# Patient Record
Sex: Male | Born: 1955
Health system: Southern US, Community
[De-identification: ages and names within clinical notes are randomized; demographics above are authoritative.]

## PROBLEM LIST (undated history)

## (undated) DIAGNOSIS — Z9989 Dependence on other enabling machines and devices: Secondary | ICD-10-CM

## (undated) DIAGNOSIS — G4733 Obstructive sleep apnea (adult) (pediatric): Secondary | ICD-10-CM

## (undated) DIAGNOSIS — IMO0002 Reserved for concepts with insufficient information to code with codable children: Secondary | ICD-10-CM

## (undated) DIAGNOSIS — E785 Hyperlipidemia, unspecified: Secondary | ICD-10-CM

## (undated) DIAGNOSIS — I10 Essential (primary) hypertension: Secondary | ICD-10-CM

## (undated) HISTORY — DX: Dependence on other enabling machines and devices: Z99.89

## (undated) HISTORY — DX: Hyperlipidemia, unspecified: E78.5

## (undated) HISTORY — DX: Reserved for concepts with insufficient information to code with codable children: IMO0002

## (undated) HISTORY — DX: Obstructive sleep apnea (adult) (pediatric): G47.33

## (undated) HISTORY — DX: Essential (primary) hypertension: I10

## (undated) HISTORY — PX: TONSILLECTOMY: SUR1361

---

## 1997-08-07 DIAGNOSIS — Z9989 Dependence on other enabling machines and devices: Secondary | ICD-10-CM

## 1997-08-07 DIAGNOSIS — G4733 Obstructive sleep apnea (adult) (pediatric): Secondary | ICD-10-CM

## 1997-08-07 HISTORY — PX: OTHER SURGICAL HISTORY: SHX169

## 1997-08-07 HISTORY — DX: Obstructive sleep apnea (adult) (pediatric): G47.33

## 1997-08-07 HISTORY — DX: Obstructive sleep apnea (adult) (pediatric): Z99.89

## 2002-09-24 ENCOUNTER — Encounter: Payer: Self-pay | Admitting: Family Medicine

## 2005-08-31 ENCOUNTER — Encounter: Payer: Self-pay | Admitting: Family Medicine

## 2006-02-01 ENCOUNTER — Encounter: Payer: Self-pay | Admitting: Family Medicine

## 2007-02-12 ENCOUNTER — Encounter: Payer: Self-pay | Admitting: Family Medicine

## 2008-10-30 ENCOUNTER — Ambulatory Visit: Payer: Self-pay | Admitting: Family Medicine

## 2008-10-30 DIAGNOSIS — G4730 Sleep apnea, unspecified: Secondary | ICD-10-CM

## 2008-10-30 DIAGNOSIS — G4733 Obstructive sleep apnea (adult) (pediatric): Secondary | ICD-10-CM | POA: Insufficient documentation

## 2008-11-04 ENCOUNTER — Encounter: Payer: Self-pay | Admitting: Family Medicine

## 2008-11-09 ENCOUNTER — Encounter: Payer: Self-pay | Admitting: Family Medicine

## 2008-11-09 DIAGNOSIS — E669 Obesity, unspecified: Secondary | ICD-10-CM | POA: Insufficient documentation

## 2008-11-09 DIAGNOSIS — J45909 Unspecified asthma, uncomplicated: Secondary | ICD-10-CM | POA: Insufficient documentation

## 2008-11-10 LAB — CONVERTED CEMR LAB
AST: 24 units/L (ref 0–37)
BUN: 16 mg/dL (ref 6–23)
Chloride: 104 meq/L (ref 96–112)
Glucose, Bld: 133 mg/dL — ABNORMAL HIGH (ref 70–99)
HDL: 49 mg/dL (ref >39)
LDL Cholesterol: 95 mg/dL (ref 0–99)
PSA, Free: 0.1 ng/mL
PSA: 0.49 ng/mL (ref 0.10–4.00)
Potassium: 4.8 meq/L (ref 3.5–5.3)
Triglycerides: 179 mg/dL — ABNORMAL HIGH (ref <150)

## 2008-11-13 ENCOUNTER — Ambulatory Visit: Payer: Self-pay | Admitting: Family Medicine

## 2008-11-13 LAB — CONVERTED CEMR LAB: Blood Glucose, Fasting: 117 mg/dL

## 2009-05-24 ENCOUNTER — Ambulatory Visit: Payer: Self-pay | Admitting: Family Medicine

## 2009-05-24 LAB — CONVERTED CEMR LAB: Hgb A1c MFr Bld: 6.3 %

## 2009-06-21 ENCOUNTER — Encounter: Payer: Self-pay | Admitting: Family Medicine

## 2009-07-23 ENCOUNTER — Ambulatory Visit: Payer: Self-pay | Admitting: Family Medicine

## 2009-08-04 ENCOUNTER — Telehealth (INDEPENDENT_AMBULATORY_CARE_PROVIDER_SITE_OTHER): Payer: Self-pay | Admitting: *Deleted

## 2009-08-16 ENCOUNTER — Ambulatory Visit: Payer: Self-pay | Admitting: Family Medicine

## 2009-08-16 DIAGNOSIS — I10 Essential (primary) hypertension: Secondary | ICD-10-CM | POA: Insufficient documentation

## 2009-09-27 ENCOUNTER — Ambulatory Visit: Payer: Self-pay | Admitting: Family Medicine

## 2009-09-27 LAB — CONVERTED CEMR LAB
Albumin/Creatinine Ratio, Urine, POC: <30
Creatinine,U: 300 mg/dL
Hgb A1c MFr Bld: 6.1 %
Microalbumin U total vol: 10 mg/L

## 2009-09-28 LAB — CONVERTED CEMR LAB
BUN: 14 mg/dL (ref 6–23)
Basophils Absolute: 0 10*3/uL (ref 0.0–0.1)
Basophils Relative: 0 % (ref 0–1)
CO2: 24 meq/L (ref 19–32)
Calcium: 9.4 mg/dL (ref 8.4–10.5)
Chloride: 105 meq/L (ref 96–112)
Creatinine, Ser: 0.82 mg/dL (ref 0.40–1.50)
Eosinophils Absolute: 0.2 10*3/uL (ref 0.0–0.7)
Eosinophils Relative: 2 % (ref 0–5)
Glucose, Bld: 132 mg/dL — ABNORMAL HIGH (ref 70–99)
HCT: 47 % (ref 39.0–52.0)
Hemoglobin: 15.9 g/dL (ref 13.0–17.0)
Lymphocytes Relative: 29 % (ref 12–46)
Lymphs Abs: 2 10*3/uL (ref 0.7–4.0)
MCHC: 33.8 g/dL (ref 30.0–36.0)
MCV: 90.4 fL (ref 78.0–100.0)
Monocytes Absolute: 0.5 10*3/uL (ref 0.1–1.0)
Monocytes Relative: 7 % (ref 3–12)
Neutro Abs: 4.3 10*3/uL (ref 1.7–7.7)
Neutrophils Relative %: 62 % (ref 43–77)
Platelets: 235 10*3/uL (ref 150–400)
Potassium: 4.4 meq/L (ref 3.5–5.3)
RBC: 5.2 M/uL (ref 4.22–5.81)
RDW: 13.7 % (ref 11.5–15.5)
Sodium: 138 meq/L (ref 135–145)
WBC: 7 10*3/uL (ref 4.0–10.5)

## 2010-01-03 ENCOUNTER — Ambulatory Visit: Payer: Self-pay | Admitting: Family Medicine

## 2010-09-06 NOTE — Assessment & Plan Note (Signed)
Summary: LFT ANKLE INJURY/TM   Vital Signs:  Patient Profile:   55 Years Old Male CC:      left ankle injury, "moving washing machine yesterday" "stepped wrong" Height:     71 inches Weight:      277 pounds O2 Sat:      98 % O2 treatment:    Room Air Temp:     98.2 degrees F oral Pulse rate:   73 / minute Resp:     18 per minute BP supine:   134 / 87  (right arm)  Pt. in pain?   yes    Location:   ankle    Intensity:   5    Type:       aching  Vitals Entered By: Joanne Chars, CMA                   Prior Medication List:  ACCU-CHEK AVIVA  STRP (GLUCOSE BLOOD) Use as directed to sugar two times a day METFORMIN HCL 850 MG TABS (METFORMIN HCL) 1 tab by mouth once daily with dinner LISINOPRIL 10 MG TABS (LISINOPRIL) 1 tab by mouth once daily FLUOXETINE HCL 20 MG CAPS (FLUOXETINE HCL) 1 capsule by mouth once daily   Current Allergies: ! * SHINGLES MEDICINE   History of Present Illness Chief Complaint: left ankle injury, "moving washing machine yesterday" "stepped wrong" History of Present Illness: Subjective:  Patient complains of twisting his left ankle yesterday while lifting an appliance.  He has pain on lateral aspect of ankle and pain walking  REVIEW OF SYSTEMS Constitutional Symptoms      Denies fever, chills, night sweats, weight loss, weight gain, and fatigue.  Eyes       Denies change in vision, eye pain, eye discharge, glasses, contact lenses, and eye surgery. Ear/Nose/Throat/Mouth       Denies hearing loss/aids, change in hearing, ear pain, ear discharge, dizziness, frequent runny nose, frequent nose bleeds, sinus problems, sore throat, hoarseness, and tooth pain or bleeding.  Respiratory       Denies dry cough, productive cough, wheezing, shortness of breath, asthma, bronchitis, and emphysema/COPD.  Cardiovascular       Denies murmurs, chest pain, and tires easily with exhertion.    Gastrointestinal       Denies stomach pain, nausea/vomiting,  diarrhea, constipation, blood in bowel movements, and indigestion. Genitourniary       Denies painful urination, kidney stones, and loss of urinary control. Neurological       Denies paralysis, seizures, and fainting/blackouts. Musculoskeletal       Complains of redness and swelling.      Denies muscle pain, joint pain, joint stiffness, decreased range of motion, muscle weakness, and gout.      Comments: pain mainly when walking Skin       Denies bruising, unusual mles/lumps or sores, and hair/skin or nail changes.  Psych       Denies mood changes, temper/anger issues, anxiety/stress, speech problems, depression, and sleep problems. Other Comments: left ankle injury yesterday, moving washer, "stepped wrong"    Objective:  No acute distress  Left ankle:  Decreased range of motion.  Tenderness and swelling over the lateral malleolus.  Joint stable.  No tenderness over the base of the fifth  metatarsal.  Distal neurovascular intact.  Left ankle X-ray:  negative Assessment New Problems: ANKLE SPRAIN, LEFT (ICD-845.00)   Plan New Medications/Changes: LORTAB 5 5-500 MG TABS (HYDROCODONE-ACETAMINOPHEN) One or two tabs by mouth hs as needed  pain  #10 (ten) x 0, 01/03/2010, Donna Christen MD NAPROXEN 500 MG TABS (NAPROXEN) One by mouth two times a day pc  #20 x 0, 01/03/2010, Donna Christen MD  New Orders: T-DG Ankle Complete*L* [10272] New Patient Level III [99203] Aircast Ankle Brace [L4350] Planning Comments:   Apply ice pack for 30 to 45 minutes every 1 to 4 hours.  Continue until swelling decreases.   Naprosyn two times a day.  Analgesic for night time as needed. Wear ace wrap until swelling resolves.  Use crutches for about 4 to 5 days (patient already has pair).  Dispensed AirCast:  wear for 3 to 4 weeks.  Begin range of motion exercises in about 5 to 7 days ([employer] information and instruction patient handout given)  Follow-up with orthopedist if not improving 2 weeks.   The  patient and/or caregiver has been counseled thoroughly with regard to medications prescribed including dosage, schedule, interactions, rationale for use, and possible side effects and they verbalize understanding.  Diagnoses and expected course of recovery discussed and will return if not improved as expected or if the condition worsens. Patient and/or caregiver verbalized understanding.  Prescriptions: LORTAB 5 5-500 MG TABS (HYDROCODONE-ACETAMINOPHEN) One or two tabs by mouth hs as needed pain  #10 (ten) x 0   Entered and Authorized by:   Donna Christen MD   Signed by:   Donna Christen MD on 01/03/2010   Method used:   Print then Give to Patient   RxID:   [id number] NAPROXEN 500 MG TABS (NAPROXEN) One by mouth two times a day pc  #20 x 0   Entered and Authorized by:   Donna Christen MD   Signed by:   Donna Christen MD on 01/03/2010   Method used:   Print then Give to Patient   RxID:   [id number]

## 2010-09-06 NOTE — Assessment & Plan Note (Signed)
Summary: f/u DM/ HTN/ mood   Vital Signs:  Patient profile:   55 year old male Height:      71 inches Weight:      268 pounds BMI:     37.51 O2 Sat:      97 % on Room air Temp:     98.4 degrees F oral Pulse rate:   59 / minute BP sitting:   112 / 70  (left arm) Cuff size:   large  Vitals Entered By: Payton Spark CMA (September 27, 2009 9:29 AM)  O2 Flow:  Room air CC: F/U mood and A1C   Primary Care Provider:  Seymour Bars DO  CC:  F/U mood and A1C.  History of Present Illness: 55 yo WM presents for f/u T2DM, HTN and new dx of depression.  6 wks ago, we started him on Fluoxetine 10 mg/ day and Lisinopril 10 mg/ day.  He is on metformin two times a day for T2DM.  His AM fasting are running 110s to 130s.  He has a red spot that is tender over the R upper arm for the past wk and it seems to be making his blood sugars go up.    It is not draining.  He has not had a fever.    His mood is improving some but he still has room for imporovement.  He is still not exercising.      Current Medications (verified): 1)  Accucheck Aviva .... Use Daily As Directed 2)  Metformin Hcl 850 Mg Tabs (Metformin Hcl) .Marland Kitchen.. 1 Tab By Mouth Once Daily With Dinner 3)  Lisinopril 10 Mg Tabs (Lisinopril) .Marland Kitchen.. 1 Tab By Mouth Once Daily 4)  Fluoxetine Hcl 10 Mg Caps (Fluoxetine Hcl) .Marland Kitchen.. 1 Capsule By Mouth Daily X 1 Wk Then 2 Capsules By Mouth Once Daily  Allergies (verified): No Known Drug Allergies  Past History:  Past Medical History: OSA, on CPAP since 1999 HNP C6-7 dyslipidemia T2DM 05-2009 HTN  last Tetanus 2008 colonoscopy 2008 Dr Danielle Dess  Past Surgical History: Reviewed history from 10/30/2008 and no changes required. C6-7 ruptured disc surgery 1999 tonsillectomy age 21  Social History: Reviewed history from 10/30/2008 and no changes required. [employer]/ [employer], self - employed. Quit smoking a pipe. < 1 ETOH/ wk. Lives with GF [employer] [employer].  Has 3 kids (grown), Damaris Hippo,  [place]. Walks/ dances. Fair diet.  Review of Systems      See HPI  Physical Exam  General:  alert, well-developed, well-nourished, and well-hydrated.  obese Head:  normocephalic and atraumatic.   Nose:  no nasal discharge.   Mouth:  pharynx pink and moist and fair dentition.   Neck:  no masses.   Lungs:  Normal respiratory effort, chest expands symmetrically. Lungs are clear to auscultation, no crackles or wheezes. Heart:  Normal rate and regular rhythm. S1 and S2 normal without gallop, murmur, click, rub or other extra sounds. Msk:  no joint swelling, no joint warmth, and no redness over joints.   Pulses:  2+ radial pulses Extremities:  no LE or UE edema RUE with tenderness and injection with palpable cord overlying superficial veins just superior to the elbow. Skin:  color normal.   Cervical Nodes:  No lymphadenopathy noted Psych:  good eye contact, not anxious appearing, and not depressed appearing.     Impression & Recommendations:  Problem # 1:  SUPERFICIAL PHLEBITIS (ICD-451.0) Assessment New RUE cephalic vein thrombosis per the radiologist today. Since there is no chance  for this to cause harm, treat supportively with warm compresses and pain meds.  Declined RX pain meds.  Can use Tyelnol as needed.  Call if any changes.  Problem # 2:  DEPRESSION (ICD-311) Assessment: Improved Continue Fluoxetine but at 20 mg/ day.  Add regular exercise.  May need increase to 40 when he comes back for mole removal. His updated medication list for this problem includes:    Fluoxetine Hcl 20 Mg Caps (Fluoxetine hcl) .Marland Kitchen... 1 capsule by mouth once daily  Problem # 3:  DM (ICD-250.00) A1C at goal.  Home sugars slightly high but he has yet to really work on diet or exercise.  Cotninue metformin.  Urine micro neg today.   His updated medication list for this problem includes:    Metformin Hcl 850 Mg Tabs (Metformin hcl) .Marland Kitchen... 1 tab by mouth once daily with dinner    Lisinopril 10 Mg Tabs  (Lisinopril) .Marland Kitchen... 1 tab by mouth once daily  Orders: Fingerstick (16109) Hemoglobin A1C (60454) Urine Microalbumin (09811)  Labs Reviewed: Creat: 0.87 (11/04/2008)   Microalbumin: 10 (09/27/2009) Reviewed HgBA1c results: 6.1 (09/27/2009)  6.3 (05/24/2009)  Problem # 4:  ESSENTIAL HYPERTENSION, BENIGN (ICD-401.1) Assessment: New At goal on Lisinopril.  Continue and check BMP today for new start ACEi. His updated medication list for this problem includes:    Lisinopril 10 Mg Tabs (Lisinopril) .Marland Kitchen... 1 tab by mouth once daily  Orders: T-Basic Metabolic Panel [id number])  BP today: 112/70 Prior BP: 136/87 (08/16/2009)  Labs Reviewed: K+: 4.8 (11/04/2008) Creat: : 0.87 (11/04/2008)   Chol: 180 (11/04/2008)   HDL: 49 (11/04/2008)   LDL: 95 (11/04/2008)   TG: 179 (11/04/2008)  Complete Medication List: 1)  Accucheck Aviva  .... Use daily as directed 2)  Metformin Hcl 850 Mg Tabs (Metformin hcl) .Marland Kitchen.. 1 tab by mouth once daily with dinner 3)  Lisinopril 10 Mg Tabs (Lisinopril) .Marland Kitchen.. 1 tab by mouth once daily 4)  Fluoxetine Hcl 20 Mg Caps (Fluoxetine hcl) .Marland Kitchen.. 1 capsule by mouth once daily  Other Orders: T-*Unlisted Diagnostic X-ray test/procedure (13086) T-D-Dimer Fibrin Derivatives Quantitive [phone]) T-CBC w/Diff ([id number])  Patient Instructions: 1)  A1C looks great at 6.1. 2)  BP looks good. 3)  Congrats on wt loss! 4)  AM fasting goal is 80-110. 5)  RFd Fluoxetine at 20 mg/ day. 6)  U/S of Right arm today. 7)  Will call you w/ results. 8)  Keep working on diabetic diet, exercise and wt loss. 9)  Return for shave excision of R foot lesion in 3 wks. Prescriptions: FLUOXETINE HCL 20 MG CAPS (FLUOXETINE HCL) 1 capsule by mouth once daily  #30 x 2   Entered and Authorized by:   Seymour Bars DO   Signed by:   Seymour Bars DO on 09/27/2009   Method used:   [facility]ron[facility] to        [facility] [phone]* (retail)       [address] [place], [place]   [address]       Ph: [phone] or [phone]       Fax: [phone]   RxID:   [id number]   Laboratory Results   Urine Tests    Microalbumin (urine): 10 mg/L Creatinine: 300mg /dL  A:C Ratio <22  Blood Tests     HGBA1C: 6.1%   (Normal Range: Non-Diabetic - 3-6%   Control Diabetic - 6-8%)

## 2010-09-06 NOTE — Assessment & Plan Note (Signed)
Summary: f/u T2DM/ depression   Vital Signs:  Patient profile:   55 year old male Height:      71 inches Weight:      274 pounds BMI:     38.35 O2 Sat:      97 % on Room air Pulse rate:   66 / minute BP sitting:   136 / 87  (left arm) Cuff size:   large  Vitals Entered By: Payton Spark CMA (August 16, 2009 9:48 AM)  O2 Flow:  Room air   CC: F/U DM.    Primary Care Provider:  Seymour Bars DO  CC:  F/U DM. Marland Kitchen  History of Present Illness: 55 yo WM presents for f/u new onset T2DM.  He was started on Metformin 850 mg at night 3 mos ago.  His AM fastings are running 90s -110s.  He did have some AM fastings of 140 while on vacation.  Denies any diarrhea from it.  He saw the nutritionist.  He had a dilated eye exam with Dr Earlene Plater in June 2010.  He is due for PNX and urine microalbumin.  He lost 7 lbs.  He cut back on his carbs.  He really has not had much time to exercise. At the end of our visit, he c/o feeling overwhelmed and depressed.  He is in a longterm relationship w/ his GF who is very negative.        Current Medications (verified): 1)  Accucheck Aviva .... Use Daily As Directed 2)  Metformin Hcl 850 Mg Tabs (Metformin Hcl) .Marland Kitchen.. 1 Tab By Mouth Once Daily With Dinner 3)  Cialis 10 Mg Tabs (Tadalafil) .... Take 1 Tablet By Mouth Once A Day As Needed  Allergies (verified): No Known Drug Allergies  Past History:  Past Medical History: OSA, on CPAP since 1999 HNP C6-7 dyslipidemia T2DM 05-2009  last Tetanus 2008 colonoscopy 2008 Dr Danielle Dess  Past Surgical History: Reviewed history from 10/30/2008 and no changes required. C6-7 ruptured disc surgery 1999 tonsillectomy age 45  Family History: Reviewed history from 10/30/2008 and no changes required. father died, prostate cancer diagnosed at 78, died at 74. mother alive, skin cancer 2 brothers, 1 with skin cancer 1 sister ovarian cancer  Social History: Reviewed history from 10/30/2008 and no changes  required. [employer]/ [employer], self - employed. Quit smoking a pipe. < 1 ETOH/ wk. Lives with GF [employer].  Has 3 kids (grown), Damaris Hippo, [place]. Walks/ dances. Fair diet.  Review of Systems      See HPI  Physical Exam  General:  alert, well-developed, well-nourished, and well-hydrated.   Head:  normocephalic and atraumatic.   Eyes:  PERRLA; wears glasses Nose:  no nasal discharge.   Mouth:  pharynx pink and moist and fair dentition.   Neck:  no masses.   Lungs:  Normal respiratory effort, chest expands symmetrically. Lungs are clear to auscultation, no crackles or wheezes. Heart:  Normal rate and regular rhythm. S1 and S2 normal without gallop, murmur, click, rub or other extra sounds. Extremities:  no LE edema Skin:  color normal.   Cervical Nodes:  No lymphadenopathy noted Psych:  good eye contact, not anxious appearing, and not depressed appearing.    Diabetes Management Exam:    Foot Exam (with socks and/or shoes not present):       Sensory-Pinprick/Light touch:          Left medial foot (L-4): normal          Left  dorsal foot (L-5): normal          Left lateral foot (S-1): normal          Right medial foot (L-4): normal          Right dorsal foot (L-5): normal          Right lateral foot (S-1): normal       Sensory-Monofilament:          Left foot: normal          Right foot: normal       Inspection:          Left foot: normal          Right foot: normal       Nails:          Left foot: normal          Right foot: normal   Impression & Recommendations:  Problem # 1:  DM (ICD-250.00) Improvement in weight and home sugar readings with nutrition counseling, home monitoring and addition of Metformin at night. Keep up the good work.  Umicroalbumin next visit.  Declined PNX today.  Monofilament normal.  Adding ACEi for BPs > 130/80.  A1C in 6 wks.   His updated medication list for this problem includes:    Metformin Hcl 850 Mg Tabs (Metformin hcl) .Marland Kitchen... 1 tab  by mouth once daily with dinner    Lisinopril 10 Mg Tabs (Lisinopril) .Marland Kitchen... 1 tab by mouth once daily  Problem # 2:  DEPRESSION (ICD-311) Assessment: New PHQ-9 score of 14 c/w moderate depression.  Agrees to start on Fluoxetine daily and will consider counseling referral.  We discussed the need to get out of his current relationship.   His updated medication list for this problem includes:    Fluoxetine Hcl 10 Mg Caps (Fluoxetine hcl) .Marland Kitchen... 1 capsule by mouth daily x 1 wk then 2 capsules by mouth once daily  Problem # 3:  ESSENTIAL HYPERTENSION, BENIGN (ICD-401.1) Start low dose ACEi for BPs >130/80 and DM.  Recheck BMP in 6 wks.   His updated medication list for this problem includes:    Lisinopril 10 Mg Tabs (Lisinopril) .Marland Kitchen... 1 tab by mouth once daily  BP today: 136/87 Prior BP: 131/78 (05/24/2009)  Labs Reviewed: K+: 4.8 (11/04/2008) Creat: : 0.87 (11/04/2008)   Chol: 180 (11/04/2008)   HDL: 49 (11/04/2008)   LDL: 95 (11/04/2008)   TG: 179 (11/04/2008)  Complete Medication List: 1)  Accucheck Aviva  .... Use daily as directed 2)  Metformin Hcl 850 Mg Tabs (Metformin hcl) .Marland Kitchen.. 1 tab by mouth once daily with dinner 3)  Lisinopril 10 Mg Tabs (Lisinopril) .Marland Kitchen.. 1 tab by mouth once daily 4)  Fluoxetine Hcl 10 Mg Caps (Fluoxetine hcl) .Marland Kitchen.. 1 capsule by mouth daily x 1 wk then 2 capsules by mouth once daily  Patient Instructions: 1)  Add Lisinopril 10 mg once daily for high BP.  Goal is <130/80. 2)  Add Fluoxetine 10 mg/ day for the first wk then go up to 20 mg once daily for mood. 3)  Call if you want to add counseling or if you have any problems. 4)  Keep working on diabetic diet, exericse, wt  loss. 5)  F/U for mood and A1C in 6 wks. Prescriptions: FLUOXETINE HCL 10 MG CAPS (FLUOXETINE HCL) 1 capsule by mouth daily x 1 wk then 2 capsules by mouth once daily  #60 x 1   Entered and Authorized by:  Seymour Bars DO   Signed by:   Seymour Bars DO on 08/16/2009   Method used:    [facility]        [facility] [phone][facility] (retail)       [address] [place], [place]  [address]       Ph: [phone] or [phone]       Fax: [phone]   RxID:   [id number] LISINOPRIL 10 MG TABS (LISINOPRIL) 1 tab by mouth once daily  #30 x 1   Entered and Authorized by:   Seymour Bars DO   Signed by:   Seymour Bars DO on 08/16/2009   Method used:   [facility] to        [facility] [phone]* (retail)       [address] [place], [place]  [address]       Ph: [phone] or [phone]       Fax: [phone]   RxID:   [id number]

## 2011-01-11 ENCOUNTER — Other Ambulatory Visit: Payer: Self-pay | Admitting: Family Medicine

## 2011-02-19 ENCOUNTER — Encounter: Payer: Self-pay | Admitting: Family Medicine

## 2011-02-24 ENCOUNTER — Ambulatory Visit (INDEPENDENT_AMBULATORY_CARE_PROVIDER_SITE_OTHER): Payer: BC Managed Care – PPO | Admitting: Family Medicine

## 2011-02-24 ENCOUNTER — Encounter: Payer: Self-pay | Admitting: Family Medicine

## 2011-02-24 DIAGNOSIS — I10 Essential (primary) hypertension: Secondary | ICD-10-CM

## 2011-02-24 DIAGNOSIS — Z0000 Encounter for general adult medical examination without abnormal findings: Secondary | ICD-10-CM

## 2011-02-24 DIAGNOSIS — Z125 Encounter for screening for malignant neoplasm of prostate: Secondary | ICD-10-CM

## 2011-02-24 DIAGNOSIS — E119 Type 2 diabetes mellitus without complications: Secondary | ICD-10-CM

## 2011-02-24 LAB — CBC WITH DIFFERENTIAL/PLATELET
Basophils Relative: 0 % (ref 0–1)
Eosinophils Absolute: 0.2 10*3/uL (ref 0.0–0.7)
Eosinophils Relative: 3 % (ref 0–5)
Hemoglobin: 16.1 g/dL (ref 13.0–17.0)
Lymphs Abs: 2.6 10*3/uL (ref 0.7–4.0)
MCH: 31.7 pg (ref 26.0–34.0)
MCHC: 34.3 g/dL (ref 30.0–36.0)
MCV: 92.3 fL (ref 78.0–100.0)
Monocytes Absolute: 0.5 10*3/uL (ref 0.1–1.0)
Monocytes Relative: 6 % (ref 3–12)
Neutrophils Relative %: 56 % (ref 43–77)
RBC: 5.08 MIL/uL (ref 4.22–5.81)

## 2011-02-24 LAB — PSA: PSA: 0.85 ng/mL (ref <=4.00)

## 2011-02-24 MED ORDER — AMBULATORY NON FORMULARY MEDICATION: Status: DC

## 2011-02-24 MED ORDER — METFORMIN HCL 850 MG PO TABS: 850.0000 mg | ORAL_TABLET | Freq: Two times a day (BID) | ORAL | Status: DC

## 2011-02-24 MED ORDER — LISINOPRIL 10 MG PO TABS: 10.0000 mg | ORAL_TABLET | Freq: Every day | ORAL | Status: DC

## 2011-02-24 NOTE — Patient Instructions (Signed)
Update labs today.  Will call you w/ results Monday.  RFd meds.  RX for CPAP supplies given.  Call [facility] to ask about DME supplier locally [phone].  Work on [employer], regular exercise.  Return for f/u diabetes in 4 mos.

## 2011-02-24 NOTE — Progress Notes (Signed)
  Subjective:    Patient ID: Antonio Rivera, male    DOB: [birth year], 55 y.o.   MRN: [id number]  HPI 55 yo WM presents for CPE.  He has not been here since Feb 2011 but says he is still on his DM and BP meds.  He is seeing a chiropractor for his lower back.  He needs a new CPAP machine since his has been over 37 yrs old.  He says that his eye exam was updated in Jan.  Denies chest pain or DOE.  Denies fam hx of premature heart dz.  He is due to repeat his colonoscopy this year.  His DRE/ PSA are due.  Fasting labs are due.  Med RFs are due.    BP 112/73  Pulse 81  Ht 6' (1.829 m)  Wt 275 lb (124.739 kg)  BMI 37.30 kg/m2  SpO2 97%    Review of Systems  Constitutional: Negative for fatigue and unexpected weight change.  HENT: Negative for hearing loss.   Eyes: Negative for visual disturbance.  Respiratory: Negative for shortness of breath.   Cardiovascular: Negative for chest pain, palpitations and leg swelling.  Gastrointestinal: Negative for nausea, abdominal pain, diarrhea and blood in stool.  Genitourinary: Negative for difficulty urinating.  Musculoskeletal: Positive for back pain.  Neurological: Negative for numbness.  Psychiatric/Behavioral: Negative for dysphoric mood.       Objective:   Physical Exam  Constitutional: He appears well-developed and well-nourished. No distress.       obese  HENT:  Right Ear: External ear normal.  Left Ear: External ear normal.  Nose: Nose normal.  Mouth/Throat: Oropharynx is clear and moist.  Eyes: Pupils are equal, round, and reactive to light. No scleral icterus.  Neck: Neck supple. No thyromegaly present.  Cardiovascular: Normal rate, regular rhythm, normal heart sounds and intact distal pulses.   No murmur heard. Pulmonary/Chest: Effort normal and breath sounds normal.  Abdominal: Soft. Bowel sounds are normal. He exhibits no distension. There is no guarding.  Genitourinary: Prostate normal. Guaiac negative stool.  Musculoskeletal:  He exhibits no edema.  Lymphadenopathy:    He has no cervical adenopathy.  Neurological:       Gait normal  Skin: Skin is warm and dry.       Diffuse tiny pustles on abdomen and upper thighs c/w folliculitis  Psychiatric: He has a normal mood and affect.          Assessment & Plan:  Assesment:  1. CPE- Keeping healthy checklist for men reviewed today.  BP at goal.  BMI 37  in the class II obesity range.     Labs ordered Colonoscopy due this year.  He's been contacted by GI to schedule this. DRE done today, PSA added to labs. Tdap UTD, PNX UTD. Encouraged healthy diet, regular exercise, MVI daily. Return for next physical in 1 yr.   Recommend use of an acne body wash for chronic folliculitis. Needs to f/u DM here in 4 mos.

## 2011-02-25 ENCOUNTER — Telehealth: Payer: Self-pay | Admitting: Family Medicine

## 2011-02-25 LAB — MICROALBUMIN / CREATININE URINE RATIO: Microalb, Ur: 0.51 mg/dL (ref 0.00–1.89)

## 2011-02-25 LAB — COMPLETE METABOLIC PANEL WITH GFR
ALT: 37 U/L (ref 0–53)
AST: 27 U/L (ref 0–37)
CO2: 24 mEq/L (ref 19–32)
Chloride: 104 mEq/L (ref 96–112)
GFR, Est African American: >60 mL/min (ref >60)
Sodium: 138 mEq/L (ref 135–145)
Total Bilirubin: 0.3 mg/dL (ref 0.3–1.2)
Total Protein: 7.4 g/dL (ref 6.0–8.3)

## 2011-02-25 LAB — HEMOGLOBIN A1C
Hgb A1c MFr Bld: 7.2 % — ABNORMAL HIGH (ref <5.7)
Mean Plasma Glucose: 160 mg/dL — ABNORMAL HIGH (ref <117)

## 2011-02-25 NOTE — Telephone Encounter (Signed)
Pls let pt know that his cholesterol is just a little high. Be sure to get back on cholesterol medicine and take every night.  A1C is 7.2 indicating fairly good control of diabetes, but will need to stay on metformin 2 x a day, everyday.  Kidney function, prostate cancer screen, blood counts and urine test for protein are all normal.

## 2011-02-27 NOTE — Telephone Encounter (Signed)
LMOM informing Pt of the above 

## 2011-04-13 ENCOUNTER — Other Ambulatory Visit: Payer: Self-pay | Admitting: Family Medicine

## 2011-04-14 ENCOUNTER — Other Ambulatory Visit: Payer: Self-pay | Admitting: *Deleted

## 2011-04-14 MED ORDER — GLUCOSE BLOOD VI STRP: ORAL_STRIP | Status: DC

## 2011-04-14 MED ORDER — LISINOPRIL 10 MG PO TABS: 10.0000 mg | ORAL_TABLET | Freq: Every day | ORAL | Status: DC

## 2011-04-14 MED ORDER — METFORMIN HCL 850 MG PO TABS: 850.0000 mg | ORAL_TABLET | Freq: Two times a day (BID) | ORAL | Status: DC

## 2011-10-04 ENCOUNTER — Other Ambulatory Visit: Payer: Self-pay | Admitting: *Deleted

## 2011-10-04 MED ORDER — METFORMIN HCL 850 MG PO TABS: 850.0000 mg | ORAL_TABLET | Freq: Two times a day (BID) | ORAL | Status: DC

## 2011-11-05 ENCOUNTER — Other Ambulatory Visit: Payer: Self-pay | Admitting: Family Medicine

## 2012-01-18 ENCOUNTER — Other Ambulatory Visit: Payer: Self-pay | Admitting: Family Medicine

## 2012-01-18 NOTE — Telephone Encounter (Signed)
Pt must make appt before any further refills

## 2012-03-23 ENCOUNTER — Other Ambulatory Visit: Payer: Self-pay | Admitting: Family Medicine

## 2012-04-02 ENCOUNTER — Ambulatory Visit (INDEPENDENT_AMBULATORY_CARE_PROVIDER_SITE_OTHER): Payer: BC Managed Care – PPO | Admitting: Family Medicine

## 2012-04-02 ENCOUNTER — Encounter: Payer: Self-pay | Admitting: Family Medicine

## 2012-04-02 VITALS — BP 116/63 | HR 79 | Temp 98.3°F | Resp 18 | Ht 69.75 in | Wt 269.0 lb

## 2012-04-02 DIAGNOSIS — E119 Type 2 diabetes mellitus without complications: Secondary | ICD-10-CM

## 2012-04-02 DIAGNOSIS — I10 Essential (primary) hypertension: Secondary | ICD-10-CM

## 2012-04-02 DIAGNOSIS — Z8042 Family history of malignant neoplasm of prostate: Secondary | ICD-10-CM

## 2012-04-02 LAB — BASIC METABOLIC PANEL
CO2: 23 mEq/L (ref 19–32)
Calcium: 9.7 mg/dL (ref 8.4–10.5)
Creat: 0.85 mg/dL (ref 0.50–1.35)
Sodium: 137 mEq/L (ref 135–145)

## 2012-04-02 LAB — LIPID PANEL
LDL Cholesterol: 145 mg/dL — ABNORMAL HIGH (ref 0–99)
VLDL: 58 mg/dL — ABNORMAL HIGH (ref 0–40)

## 2012-04-02 LAB — HEMOGLOBIN A1C
Hgb A1c MFr Bld: 6.7 % — ABNORMAL HIGH (ref <5.7)
Mean Plasma Glucose: 146 mg/dL — ABNORMAL HIGH (ref <117)

## 2012-04-02 MED ORDER — METFORMIN HCL 850 MG PO TABS: 850.0000 mg | ORAL_TABLET | Freq: Two times a day (BID) | ORAL | Status: DC

## 2012-04-02 MED ORDER — LISINOPRIL 10 MG PO TABS: 10.0000 mg | ORAL_TABLET | Freq: Every day | ORAL | Status: DC

## 2012-04-02 NOTE — Patient Instructions (Signed)
Return 3 months

## 2012-04-02 NOTE — Progress Notes (Signed)
CC: Antonio Rivera is a 56 y.o. male is here for Diabetes   Subjective: HPI: F/U after not being seen for past year.  DMII: A1c 7.2 July: Fasting sugars 200 this week,however average fasting and PP sugars are in the range of 130 - 140, no hypoglycemic episodes for the past year, saw eye doctor months ago without abnormalities.  Does not take a statin, has reservations about taking any cholesterol meds.  Takes baby ASA daily.  Denies vision changes, polyuria/dipsia/phagia, motor/sensory disturbances, poorly healing wounds, foot pain, nor peripheral tingling/numbness.  No formal exercise program.  HTN: Lisinopril daily, not taking BP outside our offices.  Denies chest pain, sob, orthopnea, peripheral edema, irregular heart beat.  HLD: LDL 114 in July last year.  Does not want cholesterol medications.   Father had Prostate Ca mid 52s and died in early 86s.  Patient denies straining to urinate, incomplete voiding, polyuria, weak stream, nor change in odor/color/consistency of urine.  Review Of Systems Outlined In HPI  Past Medical History  Diagnosis Date  . OSA on CPAP 1999  . HNP (herniated nucleus pulposus)     C6--7  . Hyperlipidemia   . Diabetes mellitus     type 2  . Hypertension      Family History  Problem Relation Age of Onset  . Cancer Mother     skin  . Cancer Father 26    prostate/died age 85   . Cancer Sister     ovarian cancer  . Cancer Brother     skin cancer     History  Substance Use Topics  . Smoking status: Former Smoker    Types: Pipe  . Smokeless tobacco: Not on file  . Alcohol Use: 0.5 oz/week    1 drink(s) per week     per week     Objective: Filed Vitals:   04/02/12 0919  BP: 116/63  Pulse: 79  Temp: 98.3 F (36.8 C)  Resp: 18    General: Alert and Oriented, No Acute Distress HEENT: Pupils equal, round, reactive to light. Conjunctivae clear.  External ears unremarkable, canals clear with intact TMs with appropriate landmarks.  Middle  ear appears open without effusion. Pink inferior turbinates.  Moist mucous membranes, pharynx without inflammation nor lesions.  Neck supple without palpable lymphadenopathy nor abnormal masses. Lungs: Clear to auscultation bilaterally, no wheezing/ronchi/rales.  Comfortable work of breathing. Good air movement. Cardiac: Regular rate and rhythm. Normal S1/S2.  No murmurs, rubs, nor gallops.   Abdomen: Normal bowel sounds, soft and non tender without palpable masses. Extremities: No peripheral edema.  Strong peripheral pulses.  Feet without skin breakdown, strong DP pulses. Mental Status: No depression, anxiety, nor agitation. Skin: Warm and dry.   Assessment & Plan: Kalum was seen today for diabetes.  Diagnoses and associated orders for this visit:  Dm - Hemoglobin A1c - Lipid panel - Microalbumin / creatinine urine ratio - Basic metabolic panel - metFORMIN (GLUCOPHAGE) 850 MG tablet; Take 1 tablet (850 mg total) by mouth 2 (two) times daily with a meal.  Essential hypertension, benign - lisinopril (PRINIVIL,ZESTRIL) 10 MG tablet; Take 1 tablet (10 mg total) by mouth daily.  Family history of prostate cancer - PSA    Update DM health topics with A1c, LDL, Microalb Urine.  He's not interested in cholesterol medications after disucssion of cardiovascular benefits.  PSA for high risk due to family history.  Return in 3 months.  Return in about 3 months (around 07/03/2012).  Requested  Prescriptions   Signed Prescriptions Disp Refills  . lisinopril (PRINIVIL,ZESTRIL) 10 MG tablet 90 tablet 3    Sig: Take 1 tablet (10 mg total) by mouth daily.  . metFORMIN (GLUCOPHAGE) 850 MG tablet 180 tablet 3    Sig: Take 1 tablet (850 mg total) by mouth 2 (two) times daily with a meal.

## 2012-04-03 LAB — MICROALBUMIN / CREATININE URINE RATIO
Creatinine, Urine: 132.5 mg/dL
Microalb Creat Ratio: 3.8 mg/g (ref 0.0–30.0)
Microalb, Ur: 0.5 mg/dL (ref 0.00–1.89)

## 2012-05-25 ENCOUNTER — Emergency Department
Admission: EM | Admit: 2012-05-25 | Discharge: 2012-05-25 | Disposition: A | Discharge status: Home / Self Care | Payer: BC Managed Care – PPO | Source: Home / Self Care

## 2012-05-25 DIAGNOSIS — L723 Sebaceous cyst: Secondary | ICD-10-CM

## 2012-05-25 MED ORDER — DOXYCYCLINE HYCLATE 100 MG PO CAPS: 100.0000 mg | ORAL_CAPSULE | Freq: Two times a day (BID) | ORAL | Status: AC

## 2012-05-25 NOTE — ED Provider Notes (Signed)
History     CSN: [id number]  Arrival date & time 05/25/12  1126   First MD Initiated Contact with Patient 05/25/12 1131      Chief Complaint  Patient presents with  . Cyst    HPI Back cyst.  Present for the last 1-2 weeks.  Has a history of recurrent sebaceous cysts.  Has had multiple cyst flares and removals in the past.  Has had worsening pain and swelling of cyst in low back area over the last 1-2 weeks.  No fevers or chills.  Baseline hx/o DM. On metformin.  Last A1C was 6.2 per pt.   Past Medical History  Diagnosis Date  . OSA on CPAP 1999  . HNP (herniated nucleus pulposus)     C6--7  . Hyperlipidemia   . Diabetes mellitus     type 2  . Hypertension     Past Surgical History  Procedure Date  . Ruptured disk surgery 1999    C6--7  . Tonsillectomy age 56    Family History  Problem Relation Age of Onset  . Cancer Mother     skin  . Cancer Father 3    prostate/died age 77   . Cancer Sister     ovarian cancer  . Cancer Brother     skin cancer    History  Substance Use Topics  . Smoking status: Former Smoker -- 30 years    Types: Pipe  . Smokeless tobacco: Never Used  . Alcohol Use: 0.5 oz/week    1 drink(s) per week     per week      Review of Systems  All other systems reviewed and are negative.    Allergies  Prednisone  Home Medications   Current Outpatient Rx  Name Route Sig Dispense Refill  . AMBULATORY NON FORMULARY MEDICATION  Medication Name: CPAP machine, mask, supplies Dx: OSA 1 Units 0  . ASPIRIN 81 MG PO TBEC Oral Take 81 mg by mouth as needed.      Marland Kitchen GLUCOSE BLOOD VI STRP  Use as instructed 100 each 3  . LISINOPRIL 10 MG PO TABS Oral Take 1 tablet (10 mg total) by mouth daily. 90 tablet 3  . METFORMIN HCL 850 MG PO TABS Oral Take 1 tablet (850 mg total) by mouth 2 (two) times daily with a meal. 180 tablet 3    BP 122/77  Pulse 71  Temp 98.2 F (36.8 C) (Oral)  Resp 18  Ht 6' (1.829 m)  Wt 273 lb 8 oz (124.059 kg)   BMI 37.09 kg/m2  SpO2 96%  Physical Exam  Constitutional: He appears well-developed and well-nourished.  HENT:  Head: Normocephalic and atraumatic.  Eyes: Conjunctivae normal are normal. Pupils are equal, round, and reactive to light.  Neck: Normal range of motion. Neck supple.  Cardiovascular: Normal rate and regular rhythm.   Pulmonary/Chest: Effort normal and breath sounds normal.         Noted 1.5cm x1.5 cm superficial sebaceous cyst on back.  Minimal redness and erythema.  Mild swelling.    Abdominal: Soft.  Musculoskeletal: Normal range of motion.  Neurological: He is alert.  Skin: Skin is warm.    ED Course  INCISION AND DRAINAGE Performed by: Doree Albee Authorized by: Doree Albee Consent: Verbal consent obtained. Consent given by: patient Patient understanding: patient states understanding of the procedure being performed Type: cyst Location: lower back  Anesthesia: local infiltration Local anesthetic: lidocaine 2% with epinephrine Patient sedated: no Scalpel  size: 11 Incision type: single straight Complexity: simple Drainage characteristics: sebum  Drainage amount: moderate Wound treatment: wound left open (iodoform gauze placed) Packing material: 1/2 in iodoform gauze Patient tolerance: Patient tolerated the procedure well with no immediate complications.   (including critical care time)  Labs Reviewed - No data to display No results found.   1. Sebaceous cyst       MDM  Area I and D'd at bedside Wound culture sent.  Will place on doxy for soft tissue coverage.  Plan for follow up in 2-3 days for recheck and packing removal.  Will needed follow up with surgery in future for cyst removal pending resolution of current flare.  Infectious and systemic red flags reviewed.     The patient and/or caregiver has been counseled thoroughly with regard to treatment plan and/or medications prescribed including dosage, schedule, interactions,  rationale for use, and possible side effects and they verbalize understanding. Diagnoses and expected course of recovery discussed and will return if not improved as expected or if the condition worsens. Patient and/or caregiver verbalized understanding.              Doree Albee, MD 05/25/12 1345

## 2012-05-25 NOTE — ED Notes (Signed)
Patient c/o cyst on mid back appeared several months ago. States inflamed and sore last few days. Patient states he has had them before.

## 2012-05-27 ENCOUNTER — Emergency Department (INDEPENDENT_AMBULATORY_CARE_PROVIDER_SITE_OTHER)
Admission: EM | Admit: 2012-05-27 | Discharge: 2012-05-27 | Disposition: A | Discharge status: Home / Self Care | Payer: BC Managed Care – PPO | Source: Home / Self Care

## 2012-05-27 DIAGNOSIS — Z5189 Encounter for other specified aftercare: Secondary | ICD-10-CM

## 2012-05-27 DIAGNOSIS — L723 Sebaceous cyst: Secondary | ICD-10-CM

## 2012-05-27 NOTE — ED Notes (Signed)
Jua is here to follow up on wound. Denies any problems.

## 2012-05-27 NOTE — ED Provider Notes (Signed)
History     CSN: [id number]  Arrival date & time 05/27/12  1733   None     Chief Complaint  Patient presents with  . Wound Check   Patient is a 56 y.o. male presenting with wound check.  Wound Check  He was treated in the ED 2 to 3 days ago. Previous treatment in the ED includes I&D of abscess and oral antibiotics. Treatments since wound repair include oral antibiotics. Fever duration: no fever. Wound drainage status: serosanguineous. There is no redness present. There is no swelling present. The pain has no pain.    Past Medical History  Diagnosis Date  . OSA on CPAP 1999  . HNP (herniated nucleus pulposus)     C6--7  . Hyperlipidemia   . Diabetes mellitus     type 2  . Hypertension     Past Surgical History  Procedure Date  . Ruptured disk surgery 1999    C6--7  . Tonsillectomy age 52    Family History  Problem Relation Age of Onset  . Cancer Mother     skin  . Cancer Father 34    prostate/died age 60   . Cancer Sister     ovarian cancer  . Cancer Brother     skin cancer    History  Substance Use Topics  . Smoking status: Former Smoker -- 30 years    Types: Pipe  . Smokeless tobacco: Never Used  . Alcohol Use: 0.5 oz/week    1 drink(s) per week     per week      Review of Systems  All other systems reviewed and are negative.    Allergies  Prednisone  Home Medications   Current Outpatient Rx  Name Route Sig Dispense Refill  . AMBULATORY NON FORMULARY MEDICATION  Medication Name: CPAP machine, mask, supplies Dx: OSA 1 Units 0  . ASPIRIN 81 MG PO TBEC Oral Take 81 mg by mouth as needed.      Marland Kitchen DOXYCYCLINE HYCLATE 100 MG PO CAPS Oral Take 1 capsule (100 mg total) by mouth 2 (two) times daily. 14 capsule 0  . GLUCOSE BLOOD VI STRP  Use as instructed 100 each 3  . LISINOPRIL 10 MG PO TABS Oral Take 1 tablet (10 mg total) by mouth daily. 90 tablet 3  . METFORMIN HCL 850 MG PO TABS Oral Take 1 tablet (850 mg total) by mouth 2 (two) times daily  with a meal. 180 tablet 3    BP 121/81  Pulse 69  Temp 98 F (36.7 C) (Oral)  Resp 16  Ht 6' (1.829 m)  Wt 275 lb (124.739 kg)  BMI 37.30 kg/m2  SpO2 98%  Physical Exam  Constitutional: He appears well-developed and well-nourished.  HENT:  Head: Normocephalic and atraumatic.  Eyes: Conjunctivae normal are normal. Pupils are equal, round, and reactive to light.  Neck: Normal range of motion. Neck supple.  Cardiovascular: Normal rate and regular rhythm.   Pulmonary/Chest: Effort normal.         Sebaceous cyst, overall healing well. Minimal redness and drainage.    Abdominal: Soft.  Musculoskeletal: Normal range of motion.  Neurological: He is alert.  Skin: Skin is warm.    ED Course  Procedures (including critical care time)  Labs Reviewed - No data to display No results found.   1. Sebaceous cyst   2. Wound check, abscess       MDM  Overall healing well  Has had some mild  GI upset with doxy. Discussed taking with food.  Area repacked.  Plan for dressing change in 3-5 days.  Will need follow up with surgery in the long term for removal.  Discussed infectious red flags.  Follow up as needed.      The patient and/or caregiver has been counseled thoroughly with regard to treatment plan and/or medications prescribed including dosage, schedule, interactions, rationale for use, and possible side effects and they verbalize understanding. Diagnoses and expected course of recovery discussed and will return if not improved as expected or if the condition worsens. Patient and/or caregiver verbalized understanding.              Doree Albee, MD 05/27/12 [phone]

## 2012-05-29 ENCOUNTER — Telehealth: Payer: Self-pay | Admitting: Emergency Medicine

## 2012-05-29 LAB — WOUND CULTURE

## 2012-06-01 ENCOUNTER — Emergency Department (INDEPENDENT_AMBULATORY_CARE_PROVIDER_SITE_OTHER)
Admission: EM | Admit: 2012-06-01 | Discharge: 2012-06-01 | Disposition: A | Discharge status: Home / Self Care | Payer: BC Managed Care – PPO | Source: Home / Self Care | Attending: Family Medicine | Admitting: Family Medicine

## 2012-06-01 DIAGNOSIS — Z4800 Encounter for change or removal of nonsurgical wound dressing: Secondary | ICD-10-CM

## 2012-06-01 DIAGNOSIS — IMO0001 Reserved for inherently not codable concepts without codable children: Secondary | ICD-10-CM

## 2012-06-01 NOTE — ED Provider Notes (Signed)
History     CSN: [id number]  Arrival date & time 06/01/12  1150   First MD Initiated Contact with Patient 06/01/12 1213      Chief Complaint  Patient presents with  . Wound Check    Follow up       HPI Comments: Patient returns for dressing change.  He has no complaints.  Patient is a 56 y.o. male presenting with wound check. The history is provided by the patient.  Wound Check  He was treated in the ED 5 to 10 days ago. Previous treatment in the ED includes I&D of abscess. Treatments since wound repair include oral antibiotics and a wound recheck. Maximum temperature: no fever. There has been no drainage from the wound. There is no redness present. There is no swelling present. The pain has no pain.    Past Medical History  Diagnosis Date  . OSA on CPAP 1999  . HNP (herniated nucleus pulposus)     C6--7  . Hyperlipidemia   . Diabetes mellitus     type 2  . Hypertension     Past Surgical History  Procedure Date  . Ruptured disk surgery 1999    C6--7  . Tonsillectomy age 27    Family History  Problem Relation Age of Onset  . Cancer Mother     skin  . Cancer Father 30    prostate/died age 10   . Cancer Sister     ovarian cancer  . Cancer Brother     skin cancer    History  Substance Use Topics  . Smoking status: Former Smoker -- 30 years    Types: Pipe  . Smokeless tobacco: Never Used  . Alcohol Use: 0.5 oz/week    1 drink(s) per week     per week      Review of Systems  All other systems reviewed and are negative.    Allergies  Prednisone  Home Medications   Current Outpatient Rx  Name Route Sig Dispense Refill  . AMBULATORY NON FORMULARY MEDICATION  Medication Name: CPAP machine, mask, supplies Dx: OSA 1 Units 0  . ASPIRIN 81 MG PO TBEC Oral Take 81 mg by mouth as needed.      Marland Kitchen GLUCOSE BLOOD VI STRP  Use as instructed 100 each 3  . LISINOPRIL 10 MG PO TABS Oral Take 1 tablet (10 mg total) by mouth daily. 90 tablet 3  . METFORMIN HCL  850 MG PO TABS Oral Take 1 tablet (850 mg total) by mouth 2 (two) times daily with a meal. 180 tablet 3  . DOXYCYCLINE HYCLATE 100 MG PO CAPS Oral Take 1 capsule (100 mg total) by mouth 2 (two) times daily. 14 capsule 0    BP 120/76  Pulse 63  Temp 97.9 F (36.6 C) (Oral)  Resp 16  Wt 270 lb (122.471 kg)  SpO2 99%  Physical Exam Appears comfortable and alert. I and D site back:  Packing removed.  No purulent drainage.  Site now only about 3mm dia by about 3mm deep.  No surrounding erythema or tenderness.  Manufacturing systems engineer. ED Course  Procedures none      1. Wound check, dressing change; wound healing well       MDM   Change bandage daily until healed.  Finish antibiotic.  Return PRN        Lattie Haw, MD 06/01/12 [phone]

## 2012-06-01 NOTE — ED Notes (Signed)
Antonio Rivera is here for a recheck of wound. Denies fever, chills or sweats.

## 2012-06-25 ENCOUNTER — Other Ambulatory Visit: Payer: Self-pay | Admitting: Family Medicine

## 2012-07-03 ENCOUNTER — Encounter: Payer: Self-pay | Admitting: Family Medicine

## 2012-07-03 ENCOUNTER — Ambulatory Visit (INDEPENDENT_AMBULATORY_CARE_PROVIDER_SITE_OTHER): Payer: BC Managed Care – PPO | Admitting: Family Medicine

## 2012-07-03 VITALS — BP 111/67 | HR 70 | Wt 278.0 lb

## 2012-07-03 DIAGNOSIS — I10 Essential (primary) hypertension: Secondary | ICD-10-CM

## 2012-07-03 DIAGNOSIS — Z8042 Family history of malignant neoplasm of prostate: Secondary | ICD-10-CM

## 2012-07-03 DIAGNOSIS — E119 Type 2 diabetes mellitus without complications: Secondary | ICD-10-CM

## 2012-07-03 DIAGNOSIS — E785 Hyperlipidemia, unspecified: Secondary | ICD-10-CM

## 2012-07-03 LAB — LIPID PANEL
Cholesterol: 176 mg/dL (ref 0–200)
HDL: 49 mg/dL
LDL Cholesterol: 93 mg/dL (ref 0–99)
Total CHOL/HDL Ratio: 3.6 ratio
Triglycerides: 168 mg/dL — ABNORMAL HIGH
VLDL: 34 mg/dL (ref 0–40)

## 2012-07-03 LAB — PSA: PSA: 0.63 ng/mL

## 2012-07-03 NOTE — Progress Notes (Addendum)
CC: Antonio Rivera is a 56 y.o. male is here for Diabetes   Subjective: HPI:  Type II DM: Patient presents for followup of type 2 diabetes, he's been testing fasting blood sugars which have been averaging no more than 125. He continues on metformin twice a day without GI disturbance. He notes that he's been somewhat stressed out at work and this has caused his fasting sugars to be closer to 120s rather than below 100, he feels confident that he knows what now and his diet to help gain better sugar control. He's due to see his ophthalmologist next coming months. Microalbumin and creatinine ratio at the last visit was 3.8. He is on an ACE inhibitor. Denies polyphagia, polydipsia, polyuria, poorly healing wounds, nor foot wounds. He takes a daily aspirin baby  Hyperlipidemia: Goal LDL less than 100 at his last visit his LDL cholesterol is 145, he was very reluctant to start prescription cholesterol lower medications instead decided to try to red yeast rice. He's been using this on a daily basis for the past 3 months. No formal exercise routine. Denies chest pain, nor limb claudication.  Hypertension: Continues on lisinopril, no outside blood pressures to report. Denies chest pain, shortness of breath, orthopnea, peripheral edema, irregular heartbeat, nor motor sensory disturbances  Family history prostate cancer in his father diagnosed in the 23s. Last PSA 3 months ago 0.8 for the past 9 years his PSA has always been below 1.0 patient is specifically requesting a PSA to be checked again today.    Review Of Systems Outlined In HPI  Past Medical History  Diagnosis Date  . OSA on CPAP 1999  . HNP (herniated nucleus pulposus)     C6--7  . Hyperlipidemia   . Diabetes mellitus     type 2  . Hypertension      Family History  Problem Relation Age of Onset  . Cancer Mother     skin  . Cancer Father 16    prostate/died age 92   . Cancer Sister     ovarian cancer  . Cancer Brother     skin  cancer     History  Substance Use Topics  . Smoking status: Former Smoker -- 30 years    Types: Pipe  . Smokeless tobacco: Never Used  . Alcohol Use: 0.5 oz/week    1 drink(s) per week     Comment: per week     Objective: Filed Vitals:   07/03/12 0843  BP: 111/67  Pulse: 70    General: Alert and Oriented, No Acute Distress HEENT: Pupils equal, round, reactive to light. Conjunctivae clear.  External ears unremarkable, canals clear with intact TMs with appropriate landmarks.  Middle ear appears open without effusion. Pink inferior turbinates.  Moist mucous membranes, pharynx without inflammation nor lesions.  Neck supple without palpable lymphadenopathy nor abnormal masses. Lungs: Clear to auscultation bilaterally, no wheezing/ronchi/rales.  Comfortable work of breathing. Good air movement. Cardiac: Regular rate and rhythm. Normal S1/S2.  No murmurs, rubs, nor gallops.   Feet: Dorsalis pedis pulses 1+ bilaterally.  Monofilament sensation intact on plantar and dorsal surface bilaterally..  No signs of infection, skin breakdown, nor ulceration. Extremities: No peripheral edema.  Strong peripheral pulses.  Mental Status: No depression, anxiety, nor agitation. Skin: Warm and dry.  Assessment & Plan: Antonio Rivera was seen today for diabetes.  Diagnoses and associated orders for this visit:  Diabetes - POCT HgB A1C  Essential hypertension, benign  Dm  Hyperlipidemia ldl goal <  100 - Lipid panel  Family history of prostate cancer - PSA  Other Orders - Cancel: POCT UA - Microalbumin    Type 2 diabetes: A1c 7.3, Continue metformin, he'd prefer to focus on cutting out simple sugars in his diet rather than starting the medication. I let him know that if not goal at next visit I would strongly encourage oral or injectable medication. Hyperlipidemia: Urged patient to start a statin based on LDL guidelines from the [employer] diabetic Association, he declines.  Lipid panel today to look  for improvement since starting red yeast rice Hypertension: At goal no change to lisinopril Family history prostate cancer: PSA per patient request  Return in about 3 months (around 10/03/2012).

## 2012-07-08 ENCOUNTER — Encounter: Payer: Self-pay | Admitting: Family Medicine

## 2012-08-30 ENCOUNTER — Encounter: Payer: Self-pay | Admitting: *Deleted

## 2012-08-30 ENCOUNTER — Emergency Department
Admission: EM | Admit: 2012-08-30 | Discharge: 2012-08-30 | Disposition: A | Discharge status: Home / Self Care | Payer: BC Managed Care – PPO | Source: Home / Self Care | Attending: Family Medicine | Admitting: Family Medicine

## 2012-08-30 DIAGNOSIS — J0100 Acute maxillary sinusitis, unspecified: Secondary | ICD-10-CM

## 2012-08-30 MED ORDER — AMOXICILLIN 875 MG PO TABS: 875.0000 mg | ORAL_TABLET | Freq: Two times a day (BID) | ORAL | Status: DC

## 2012-08-30 NOTE — ED Notes (Signed)
Patient c/o 2 weeks of sinus pain and congestion/drainage and cough. Denies fever. No otc meds taken

## 2012-08-30 NOTE — ED Provider Notes (Signed)
History     CSN: [id number]  Arrival date & time 08/30/12  1626   First MD Initiated Contact with Patient 08/30/12 1705      Chief Complaint  Patient presents with  . Sinus Problem  . Cough      HPI Comments: Patient complains of 2 week history of sinus pain and congestion/drainage and cough. Denies fever. No otc meds taken   The history is provided by the patient.    Past Medical History  Diagnosis Date  . OSA on CPAP 1999  . HNP (herniated nucleus pulposus)     C6--7  . Hyperlipidemia   . Diabetes mellitus     type 2  . Hypertension     Past Surgical History  Procedure Date  . Ruptured disk surgery 1999    C6--7  . Tonsillectomy age 89    Family History  Problem Relation Age of Onset  . Cancer Mother     skin  . Cancer Father 46    prostate/died age 22   . Cancer Sister     ovarian cancer  . Cancer Brother     skin cancer    History  Substance Use Topics  . Smoking status: Former Smoker -- 30 years    Types: Pipe  . Smokeless tobacco: Never Used  . Alcohol Use: 0.5 oz/week    1 drink(s) per week     Comment: per week      Review of Systems + sore throat + cough No pleuritic pain No wheezing + nasal congestion + post-nasal drainage + sinus pain/pressure No itchy/red eyes No earache No hemoptysis No SOB No fever/chills No nausea No vomiting No abdominal pain No diarrhea No urinary symptoms No skin rashes + fatigue No myalgias + headache   Allergies  Prednisone  Home Medications   Current Outpatient Rx  Name  Route  Sig  Dispense  Refill  . ACCU-CHEK AVIVA PLUS VI STRP      USE AS INSTRUCTED   100 strip   1   . AMBULATORY NON FORMULARY MEDICATION      Medication Name: CPAP machine, mask, supplies Dx: OSA   1 Units   0   . AMOXICILLIN 875 MG PO TABS   Oral   Take 1 tablet (875 mg total) by mouth 2 (two) times daily.   20 tablet   0   . ASPIRIN 81 MG PO TBEC   Oral   Take 81 mg by mouth as needed.             Marland Kitchen LISINOPRIL 10 MG PO TABS   Oral   Take 1 tablet (10 mg total) by mouth daily.   90 tablet   3   . METFORMIN HCL 850 MG PO TABS   Oral   Take 1 tablet (850 mg total) by mouth 2 (two) times daily with a meal.   180 tablet   3     BP 120/75  Pulse 83  Temp 98.7 F (37.1 C) (Oral)  Resp 16  Ht 6' (1.829 m)  Wt 270 lb (122.471 kg)  BMI 36.62 kg/m2  SpO2 99%  Physical Exam Nursing notes and Vital Signs reviewed. Appearance:  Patient appears obese, stated age, and in no acute distress.  Patient is obese (BMI 36.6) Eyes:  Pupils are equal, round, and reactive to light and accomodation.  Extraocular movement is intact.  Conjunctivae are not inflamed  Ears:  Canals normal.  Tympanic membranes normal.  Nose:  Moderately congested turbinates.   Maxillary sinus tenderness is present.  Pharynx:  Normal Neck:  Supple.  Slightly tender shotty posterior nodes are palpated bilaterally  Lungs:  Clear to auscultation.  Breath sounds are equal.  Heart:  Regular rate and rhythm without murmurs, rubs, or gallops.  Abdomen:  Nontender without masses or hepatosplenomegaly.  Bowel sounds are present.  No CVA or flank tenderness.  Extremities:  No edema.  No calf tenderness Skin:  No rash present.   ED Course  Procedures none      1. Acute maxillary sinusitis following a viral URI       MDM  Begin amoxicillin for 10 days. Take Mucinex D (guaifenesin with decongestant) twice daily for congestion.  Increase fluid intake, rest. May use Afrin nasal spray (or generic oxymetazoline) twice daily for about 5 days.  Also recommend using saline nasal spray several times daily and saline nasal irrigation (AYR is a common brand) Stop all antihistamines for now, and other non-prescription cough/cold preparations. If cough develops, may take Delsym Cough Suppressant at bedtime for nighttime cough.  Follow-up with family doctor if not improving 7 to 10 days.         Lattie Haw,  MD 09/02/12 [phone]

## 2012-10-01 ENCOUNTER — Ambulatory Visit (INDEPENDENT_AMBULATORY_CARE_PROVIDER_SITE_OTHER): Payer: BC Managed Care – PPO | Admitting: Family Medicine

## 2012-10-01 VITALS — BP 108/67 | HR 62 | Wt 272.0 lb

## 2012-10-01 DIAGNOSIS — E785 Hyperlipidemia, unspecified: Secondary | ICD-10-CM

## 2012-10-01 DIAGNOSIS — E1140 Type 2 diabetes mellitus with diabetic neuropathy, unspecified: Secondary | ICD-10-CM | POA: Insufficient documentation

## 2012-10-01 DIAGNOSIS — E119 Type 2 diabetes mellitus without complications: Secondary | ICD-10-CM

## 2012-10-01 DIAGNOSIS — I10 Essential (primary) hypertension: Secondary | ICD-10-CM

## 2012-10-01 NOTE — Progress Notes (Signed)
CC: Antonio Rivera is a 57 y.o. male is here for Diabetes   Subjective: HPI:  Followup type 2 diabetes: Urine microalbumin within normal limits August 2013. He is taking lisinopril. He takes a baby aspirin, he has declined offers for statins and is taking red yeast rice instead. LDL less than 100 at his visit in November.  Recent fasting sugars ranging 150-180, higher end of the spectrum if he consumes salad the night before, lower end of the spectrum if he eats pizza or sandwich for dinner. Denies polyuria polyphasia or polydipsia. He had a blister on his right foot in December but this has resolved. He denies tingling or numbness in extremities. Eye exam was performed November.  HLD: Continues to take red yeast rice and a daily basis, unknown dose. He denies right upper quadrant pain, myalgias, nor skin or scleral discoloration. He denies chest pain with exertion nor limb claudication. He is actively trying to cut out saturated fats and cholesterol in his diet, no formal exercise plan.  HTN: Takes lisinopril and a daily basis. No outside blood pressures to report. Denies shortness of breath, angioedema, cough, orthopnea, peripheral edema nor motor or sensory disturbances    Review Of Systems Outlined In HPI  Past Medical History  Diagnosis Date  . OSA on CPAP 1999  . HNP (herniated nucleus pulposus)     C6--7  . Hyperlipidemia   . Diabetes mellitus     type 2  . Hypertension      Family History  Problem Relation Age of Onset  . Cancer Mother     skin  . Cancer Father 9    prostate/died age 27   . Cancer Sister     ovarian cancer  . Cancer Brother     skin cancer     History  Substance Use Topics  . Smoking status: Former Smoker -- 30 years    Types: Pipe  . Smokeless tobacco: Never Used  . Alcohol Use: 0.5 oz/week    1 drink(s) per week     Comment: per week     Objective: Filed Vitals:   10/01/12 0829  BP: 108/67  Pulse: 62    General: Alert and Oriented,  No Acute Distress HEENT: Pupils equal, round, reactive to light. Conjunctivae clear.   moist mucous membranes  Lungs: Clear to auscultation bilaterally, no wheezing/ronchi/rales.  Comfortable work of breathing. Good air movement. Cardiac: Regular rate and rhythm. Normal S1/S2.  No murmurs, rubs, nor gallops.   Abdomen:  obese soft nontender  Feet: Dorsalis pedis pulses 1+ bilaterally.  Monofilament sensation intact on plantar and dorsal surface bilaterally.    No signs of infection, skin breakdown, nor ulceration. Extremities: No peripheral edema.  Strong peripheral pulses.  Mental Status: No depression, anxiety, nor agitation. Skin: Warm and dry.  Assessment & Plan: Antonio Rivera was seen today for diabetes.  Diagnoses and associated orders for this visit:  Type 2 diabetes mellitus - POCT HgB A1C - Red Yeast Rice 600 MG CAPS; Unknown dose  Essential hypertension, benign  Hyperlipidemia LDL goal <100    Type 2 diabetes: Uncontrolled with A1c of 7.1. Do to uncontrolled fasting sugars he will try taking both metformin doses in the evening. He would like to focus on cutting back sugars and increasing activity instead of adjusting total antihypoglycemic medication intake. He declines podiatry referral, encouraged use moleskin as needed for sites that blister. Essential hypertension: Control, continue lisinopril Hyperlipidemia: Clinically control, continue red yeast rice will recheck in  9 months  Return in about 3 months (around 12/29/2012).

## 2012-12-31 ENCOUNTER — Ambulatory Visit (INDEPENDENT_AMBULATORY_CARE_PROVIDER_SITE_OTHER): Payer: BC Managed Care – PPO | Admitting: Family Medicine

## 2012-12-31 ENCOUNTER — Encounter: Payer: Self-pay | Admitting: Family Medicine

## 2012-12-31 VITALS — BP 105/68 | HR 65 | Ht 69.75 in | Wt 274.0 lb

## 2012-12-31 DIAGNOSIS — I10 Essential (primary) hypertension: Secondary | ICD-10-CM

## 2012-12-31 DIAGNOSIS — E119 Type 2 diabetes mellitus without complications: Secondary | ICD-10-CM

## 2012-12-31 MED ORDER — METFORMIN HCL 850 MG PO TABS: 850.0000 mg | ORAL_TABLET | Freq: Two times a day (BID) | ORAL | Status: DC

## 2012-12-31 MED ORDER — LISINOPRIL 10 MG PO TABS: 10.0000 mg | ORAL_TABLET | Freq: Every day | ORAL | Status: DC

## 2012-12-31 NOTE — Progress Notes (Signed)
CC: Antonio Rivera is a 57 y.o. male is here for Diabetes   Subjective: HPI:  Followup type 2 diabetes: He continues on metformin 850 mg twice a day.  Fasting blood sugars ranging 107-170, 2 hour postprandials consistently below 160. Denies hypoglycemic episodes. Denies vision loss, motor sensory disturbances, tingling or numbness or burning of the extremities, foot lesions. Denies polyuria polyphagia polydipsia. Continues on red yeast rice and daily baby aspirin.  Followup hypertension: Continues on lisinopril daily basis. No outside blood pressures to report. Denies chest pain, shortness of breath, orthopnea, peripheral edema, cough, nor angioedema   Review Of Systems Outlined In HPI  Past Medical History  Diagnosis Date  . OSA on CPAP 1999  . HNP (herniated nucleus pulposus)     C6--7  . Hyperlipidemia   . Diabetes mellitus     type 2  . Hypertension      Family History  Problem Relation Age of Onset  . Cancer Mother     skin  . Cancer Father 36    prostate/died age 50   . Cancer Sister     ovarian cancer  . Cancer Brother     skin cancer     History  Substance Use Topics  . Smoking status: Former Smoker -- 30 years    Types: Pipe  . Smokeless tobacco: Never Used  . Alcohol Use: 0.5 oz/week    1 drink(s) per week     Comment: per week     Objective: Filed Vitals:   12/31/12 0839  BP: 105/68  Pulse: 65    General: Alert and Oriented, No Acute Distress HEENT: Pupils equal, round, reactive to light. Conjunctivae clear.  Moist mucous membranes Lungs: Clear to auscultation bilaterally, no wheezing/ronchi/rales.  Comfortable work of breathing. Good air movement. Cardiac: Regular rate and rhythm. Normal S1/S2.  No murmurs, rubs, nor gallops.  No carotid bruit Extremities: No peripheral edema.  Strong peripheral pulses.  Mental Status: No depression, anxiety, nor agitation. Skin: Warm and dry.  Assessment & Plan: Antonio Rivera was seen today for  diabetes.  Diagnoses and associated orders for this visit:  Type 2 diabetes mellitus - POCT HgB A1C  Essential hypertension, benign - lisinopril (PRINIVIL,ZESTRIL) 10 MG tablet; Take 1 tablet (10 mg total) by mouth daily.  DM - metFORMIN (GLUCOPHAGE) 850 MG tablet; Take 1 tablet (850 mg total) by mouth 2 (two) times daily with a meal.    Type 2 diabetes: A1c 7.5 uncontrolled compared to 7.1 3 months ago. I've encouraged him to consider GLP1 vs Sulfonaurea, vs Hortencia Conradi however he would rather focus on diet and exercise interventions.  Discussed such interventions and given folder on diet interventions. Return in 3 months Essential hypertension: Controlled, continue lisinopril   Return in about 3 months (around 04/02/2013).

## 2013-02-13 ENCOUNTER — Other Ambulatory Visit: Payer: Self-pay

## 2013-03-28 ENCOUNTER — Telehealth: Payer: Self-pay | Admitting: *Deleted

## 2013-03-28 DIAGNOSIS — I10 Essential (primary) hypertension: Secondary | ICD-10-CM

## 2013-03-28 DIAGNOSIS — Z8042 Family history of malignant neoplasm of prostate: Secondary | ICD-10-CM

## 2013-03-28 DIAGNOSIS — E785 Hyperlipidemia, unspecified: Secondary | ICD-10-CM

## 2013-03-28 LAB — COMPLETE METABOLIC PANEL WITH GFR
ALT: 27 U/L (ref 0–53)
BUN: 16 mg/dL (ref 6–23)
CO2: 29 mEq/L (ref 19–32)
Calcium: 9.5 mg/dL (ref 8.4–10.5)
Chloride: 103 mEq/L (ref 96–112)
Creat: 0.91 mg/dL (ref 0.50–1.35)
GFR, Est African American: >89 mL/min
GFR, Est Non African American: >89 mL/min
Glucose, Bld: 162 mg/dL — ABNORMAL HIGH (ref 70–99)

## 2013-03-28 LAB — LIPID PANEL
Cholesterol: 200 mg/dL (ref 0–200)
LDL Cholesterol: 117 mg/dL — ABNORMAL HIGH (ref 0–99)
Triglycerides: 200 mg/dL — ABNORMAL HIGH (ref <150)

## 2013-03-28 NOTE — Telephone Encounter (Signed)
Pt would like lab before his physical

## 2013-04-02 ENCOUNTER — Encounter: Payer: Self-pay | Admitting: Family Medicine

## 2013-04-02 ENCOUNTER — Ambulatory Visit (INDEPENDENT_AMBULATORY_CARE_PROVIDER_SITE_OTHER): Payer: BC Managed Care – PPO | Admitting: Family Medicine

## 2013-04-02 ENCOUNTER — Encounter: Payer: Self-pay | Admitting: Internal Medicine

## 2013-04-02 ENCOUNTER — Encounter: Payer: BC Managed Care – PPO | Admitting: Sports Medicine

## 2013-04-02 VITALS — BP 114/63 | HR 70 | Ht 69.75 in | Wt 264.0 lb

## 2013-04-02 DIAGNOSIS — Z23 Encounter for immunization: Secondary | ICD-10-CM

## 2013-04-02 DIAGNOSIS — Z0000 Encounter for general adult medical examination without abnormal findings: Secondary | ICD-10-CM

## 2013-04-02 DIAGNOSIS — E119 Type 2 diabetes mellitus without complications: Secondary | ICD-10-CM

## 2013-04-02 DIAGNOSIS — Z1211 Encounter for screening for malignant neoplasm of colon: Secondary | ICD-10-CM

## 2013-04-02 DIAGNOSIS — I10 Essential (primary) hypertension: Secondary | ICD-10-CM

## 2013-04-02 LAB — HEMOGLOBIN A1C: Mean Plasma Glucose: 157 mg/dL — ABNORMAL HIGH (ref <117)

## 2013-04-02 NOTE — Progress Notes (Signed)
CC: Antonio Rivera is a 57 y.o. male is here for Annual Exam   Subjective: HPI:  Colonoscopy: 2008 Due now referral has benenn placed Prostate: Discussed screening risks/beneifts with patient on 04/02/2013.  History in father therefore PSA testing 3-6 months frequency  Influenza Vaccine: Declined today, will get at work or in September.  Pneumovax: will receive today Td/Tdap: Td 08/08/2007 Zoster: (Start 57 yo)  Over the past 2 weeks have you been bothered by: - Little interest or pleasure in doing things: no - Feeling down depressed or hopeless: no  Has been cutting out fatty foods lost 10 pounds intentionally over past 3 months stays physically active at work no formal exercise routine. Rare alcohol use no tobacco use no recreational drug use   Review Of Systems Outlined In HPI  Past Medical History  Diagnosis Date  . OSA on CPAP 1999  . HNP (herniated nucleus pulposus)     C6--7  . Hyperlipidemia   . Diabetes mellitus     type 2  . Hypertension      Family History  Problem Relation Age of Onset  . Cancer Mother     skin  . Cancer Father 64    prostate/died age 58   . Cancer Sister     ovarian cancer  . Cancer Brother     skin cancer     History  Substance Use Topics  . Smoking status: Former Smoker -- 30 years    Types: Pipe  . Smokeless tobacco: Never Used  . Alcohol Use: 0.5 oz/week    1 drink(s) per week     Comment: per week     Objective: Filed Vitals:   04/02/13 0919  BP: 114/63  Pulse: 70   General: No Acute Distress HEENT: Atraumatic, normocephalic, conjunctivae normal without scleral icterus.  No nasal discharge, hearing grossly intact, TMs with good landmarks bilaterally with no middle ear abnormalities, posterior pharynx clear without oral lesions. Neck: Supple, trachea midline, no cervical nor supraclavicular adenopathy. Pulmonary: Clear to auscultation bilaterally without wheezing, rhonchi, nor rales. Cardiac: Regular rate and rhythm.   No murmurs, rubs, nor gallops. No peripheral edema.  2+ peripheral pulses bilaterally. Abdomen: Bowel sounds normal.  No masses.  Non-tender without rebound.  Negative Murphy's sign. GU: Bilateral descended non-tender testicles without palpable abnormal masses. No inguinal hernia MSK: Grossly intact, no signs of weakness.  Full strength throughout upper and lower extremities.  Full ROM in upper and lower extremities.  No midline spinal tenderness. Neuro: Gait unremarkable, CN II-XII grossly intact.  C5-C6 Reflex 2/4 Bilaterally, L4 Reflex 2/4 Bilaterally.  Cerebellar function intact. Skin: No rashes. Psych: Alert and oriented to person/place/time.  Thought process normal. No anxiety/depression.  Assessment & Plan: Antonio Rivera was seen today for annual exam.  Diagnoses and associated orders for this visit:  Annual physical exam  Essential hypertension, benign  Type 2 diabetes mellitus - Hemoglobin A1c  Screening for colon cancer - Ambulatory referral to Gastroenterology    Healthy lifestyle interventions including but limited to regular exercise, a healthy low fat diet, moderation of salt intake, the dangers of tobacco/alcohol/recreational drug use, nutrition supplementation, and accident avoidance were discussed with the patient and a handout was provided for future reference. Discussed self testicular exam monthly basis.  He is overdue for A1c this would be checked today  Discussed diet and exercise interventions to help LDL goal of 100 and reached he has strong reservations about statin medications and cholesterol medications other than red yeast  rice  Return in about 3 months (around 07/03/2013) for Diabetes Check.

## 2013-04-02 NOTE — Patient Instructions (Addendum)
Dr. Minoru Chap's General Advice Following Your Complete Physical Exam  The Benefits of Regular Exercise: Unless you suffer from an uncontrolled cardiovascular condition, studies strongly suggest that regular exercise and physical activity will add to both the quality and length of your life.  The World Health Organization recommends 150 minutes of moderate intensity aerobic activity every week.  This is best split over 3-4 days a week, and can be as simple as a brisk walk for just over 35 minutes "most days of the week".  This type of exercise has been shown to lower LDL-Cholesterol, lower average blood sugars, lower blood pressure, lower cardiovascular disease risk, improve memory, and increase one's overall sense of wellbeing.  The addition of anaerobic (or "strength training") exercises offers additional benefits including but not limited to increased metabolism, prevention of osteoporosis, and improved overall cholesterol levels.  How Can I Strive For A Low-Fat Diet?: Current guidelines recommend that 25-35 percent of your daily energy (food) intake should come from fats.  One might ask how can this be achieved without having to dissect each meal on a daily basis?  Switch to skim or 1% milk instead of whole milk.  Focus on lean meats such as ground turkey, fresh fish, baked chicken, and lean cuts of beef as your source of dietary protein.  Consume less than 300mg/day of dietary cholesterol.  Limit trans fatty acid consumption primarily by limiting synthetic trans fats such as partially hydrogenated oils (Ex: fried fast foods).  Focus efforts on reducing your intake of "solid" fats (Ex: Butter).  Substitute olive or vegetable oil for solid fats where possible.  Moderation of Salt Intake: Provided you don't carry a diagnosis of congestive heart failure nor renal failure, I recommend a daily allowance of no more than 2300 mg of salt (sodium).  Keeping under this daily goal is associated with a  decreased risk of cardiovascular events, creeping above it can lead to elevated blood pressures and increases your risk of cardiovascular events.  Milligrams (mg) of salt is listed on all nutrition labels, and your daily intake can add up faster than you think.  Most canned and frozen dinners can pack in over half your daily salt allowance in one meal.    Lifestyle Health Risks: Certain lifestyle choices carry specific health risks.  As you may already know, tobacco use has been associated with increasing one's risk of cardiovascular disease, pulmonary disease, numerous cancers, among many other issues.  What you may not know is that there are medications and nicotine replacement strategies that can more than double your chances of successfully quitting.  I would be thrilled to help manage your quitting strategy if you currently use tobacco products.  When it comes to alcohol use, I've yet to find an "ideal" daily allowance.  Provided an individual does not have a medical condition that is exacerbated by alcohol consumption, general guidelines determine "safe drinking" as no more than two standard drinks for a man or no more than one standard drink for a male per day.  However, much debate still exists on whether any amount of alcohol consumption is technically "safe".  My general advice, keep alcohol consumption to a minimum for general health promotion.  If you or others believe that alcohol, tobacco, or recreational drug use is interfering with your life, I would be happy to provide confidential counseling regarding treatment options.  General "Over The Counter" Nutrition Advice: Postmenopausal women should aim for a daily calcium intake of 1200 mg, however a significant   portion of this might already be provided by diets including milk, yogurt, cheese, and other dairy products.  Vitamin D has been shown to help preserve bone density, prevent fatigue, and has even been shown to help reduce falls in the  elderly.  Ensuring a daily intake of 800 Units of Vitamin D is a good place to start to enjoy the above benefits, we can easily check your Vitamin D level to see if you'd potentially benefit from supplementation beyond 800 Units a day.  Folic Acid intake should be of particular concern to women of childbearing age.  Daily consumption of 400-800 mcg of Folic Acid is recommended to minimize the chance of spinal cord defects in a fetus should pregnancy occur.    For many adults, accidents still remain one of the most common culprits when it comes to cause of death.  Some of the simplest but most effective preventitive habits you can adopt include regular seatbelt use, proper helmet use, securing firearms, and regularly testing your smoke and carbon monoxide detectors.  Lumi Winslett B. Masiah Lewing DO [facility] [address], [address] [place],  [address] Phone: [phone]   Self-Exam Of The Testicles Young men ages 15-35 are the ones who most commonly get cancer of the testicles. About 300 young men die of this disease each year. Testicular cancer does occur in middle-aged or older men but to a lesser extent. This cancer can almost always be cured if it is found before it gets bad and if it is treated. WORDS TO KNOW:  Testicles are the 2 egg-shaped glands that make hormones and sperm in men.  The scrotum is the skin around testicles.  Epididymis is the rope-like part that is behind and above each testis. It collects sperm made by the testis. WHICH MEN ARE AT HIGH RISK FOR CANCER OF THE TESTICLES?  Men between ages 15 to 31.  Men who are Caucasian.  Men who were born with a testicle that had not moved down into the scrotum.  Men whose testicles have gotten smaller because of an infection.  Men whose fathers or brothers have had cancer of the testicles. SYMPTOMS OF TESTICULAR CANCER  A painless swelling in one of your testicles.  A hard lump. Some lumps may be an infection.  A  heavy feeling in your testicles.  An ache in your lower belly (abdomen) or groin. HOW OFTEN SHOULD I CHECK MY TESTICLES? You should check your testicles every month. HOW SHOULD I DO THIS CHECK? It is best to check your testicles right after a warm shower or bath.  Look at your testicles for any swelling. You may need to use a mirror.  Use both your hands to roll each testicle between your thumb and fingers. Feel for any lumps or changes in the size of the testicle. Press firmly. You may find that one testicle is a little bigger than the other. This is normal.  Next, check the epididymis on each testicle. This is the part where most testicular cancers happen. It is normal for the epididymis to feel soft and uneven. When you get used to how your epididymis feels, you will be able to tell if there is any change.  WHAT IF I FIND ANY SWELLINGS OR LUMPS?  Call your doctor. Some lumps may be an infection. If the lump or swelling is a cancer, it can be treated before it gets worse.  Document Released: 10/20/2008 Document Revised: 10/16/2011 Document Reviewed: 10/20/2008 [employer] Patient Information 2014   [employer]  

## 2013-04-02 NOTE — Addendum Note (Signed)
Addended by: Wyline Beady on: 04/02/2013 10:24 AM   Modules accepted: Orders

## 2013-04-02 NOTE — Progress Notes (Signed)
Patient informed of lab results. Addilynne Olheiser,CMA

## 2013-04-08 ENCOUNTER — Telehealth: Payer: Self-pay | Admitting: *Deleted

## 2013-04-08 DIAGNOSIS — Z1211 Encounter for screening for malignant neoplasm of colon: Secondary | ICD-10-CM

## 2013-04-08 NOTE — Telephone Encounter (Signed)
Pt calls to let you know that he had his colonoscopy done back in 2007 at [facility].  He states he is in [facility] mostly and if needs another one then prefers to have done in [place] if possible.

## 2013-04-09 NOTE — Telephone Encounter (Signed)
Sue Lush, Will you please let mr. Caras know that I'll place a referral through [facility] since he's seen them in the past.  He can disregard any referral through [facility]

## 2013-04-09 NOTE — Telephone Encounter (Signed)
Pt's mailbox is full.

## 2013-04-23 ENCOUNTER — Encounter: Payer: Self-pay | Admitting: Family Medicine

## 2013-04-23 DIAGNOSIS — Z860100 Personal history of colon polyps, unspecified: Secondary | ICD-10-CM | POA: Insufficient documentation

## 2013-05-05 ENCOUNTER — Encounter: Payer: Self-pay | Admitting: Family Medicine

## 2013-05-06 ENCOUNTER — Ambulatory Visit (INDEPENDENT_AMBULATORY_CARE_PROVIDER_SITE_OTHER): Payer: BC Managed Care – PPO | Admitting: Family Medicine

## 2013-05-06 ENCOUNTER — Encounter: Payer: Self-pay | Admitting: Family Medicine

## 2013-05-06 VITALS — BP 131/82 | HR 73 | Temp 97.7°F | Wt 266.0 lb

## 2013-05-06 DIAGNOSIS — S161XXA Strain of muscle, fascia and tendon at neck level, initial encounter: Secondary | ICD-10-CM

## 2013-05-06 DIAGNOSIS — S139XXA Sprain of joints and ligaments of unspecified parts of neck, initial encounter: Secondary | ICD-10-CM

## 2013-05-06 NOTE — Progress Notes (Signed)
CC: Antonio Rivera is a 57 y.o. male is here for Neck Pain   Subjective: HPI:  Patient complains of posterior lateral neck pain on the right side has been present ever since late last night. 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 He felt some mild swelling and some itching at the base of his right scalp last night along the same time that his neck pain began which is described as mild soreness nothing particularly makes better or worse. He denies midline neck pain, radiation of pain, fevers, chills, Nor skin changes at the site of discomfort other than that described above.   Review Of Systems Outlined In HPI  Past Medical History  Diagnosis Date  . OSA on CPAP 1999  . HNP (herniated nucleus pulposus)     C6--7  . Hyperlipidemia   . Diabetes mellitus     type 2  . Hypertension      Family History  Problem Relation Age of Onset  . Cancer Mother     skin  . Cancer Father 24    prostate/died age 59   . Cancer Sister     ovarian cancer  . Cancer Brother     skin cancer     History  Substance Use Topics  . Smoking status: Former Smoker -- 30 years    Types: Pipe  . Smokeless tobacco: Never Used  . Alcohol Use: 0.5 oz/week    1 drink(s) per week     Comment: per week     Objective: Filed Vitals:   05/06/13 1530  BP: 131/82  Pulse: 73  Temp: 97.7 F (36.5 C)    General: Alert and Oriented, No Acute Distress HEENT: Pupils equal, round, reactive to light. Conjunctivae clear.  Moist mucous membranes pharynx unremarkable Neck supple without palpable lymphadenopathy nor abnormal masses. At the site of his discomfort there is no swelling redness or palpable abnormality, this is located just below the occiput on the right. Back: No midline spinous process tenderness in the C-spine he has full range of motion strength of the neck Mental Status: No depression, anxiety, nor agitation. Skin: Warm  and dry.  Assessment & Plan: Antonio Rivera was seen today for neck pain.  Diagnoses and associated orders for this visit:  Cervical strain, initial encounter    Discussed with patient on suspicious that he does have a mild cervical strain reassurance provided nothing more serious, given samples on the flexor patch to use when he falls asleep at night, consider massage and during the day and warmth with range of motion exercises  Return if symptoms worsen or fail to improve.

## 2013-05-29 ENCOUNTER — Other Ambulatory Visit: Payer: Self-pay | Admitting: Family Medicine

## 2013-06-10 ENCOUNTER — Telehealth: Payer: Self-pay | Admitting: Family Medicine

## 2013-06-10 NOTE — Telephone Encounter (Signed)
Sue Lush, Will you please ask Mr. Antonio Rivera if he's requested cpap supplies from [employer]? If so, it looks like they need a copy of his original sleep study which was obtained prior to him joining our clinic, we do not have this in our records.  If he is unable to locate this we'll need to have a repeat sleep study at our sleep clinic for insurance to cover [employer] supplies.

## 2013-06-10 NOTE — Telephone Encounter (Signed)
Called pt and he did request the co to send over supplies request. We dont have any records of sleep study in old [employer] or [employer]. Pt will try and get [place] the name of the doctor who may have ordered the sleep study so that we can request the note if possible

## 2013-06-16 NOTE — Telephone Encounter (Signed)
Called [facility] and they didn't have record of ever having seeing this pt. I called pt and let him know this and that we would need to set him up for another sleep study, Pt was ok with this

## 2013-06-16 NOTE — Telephone Encounter (Signed)
Pt called and left a message that [facility] may have record of his previous sleep [phone])

## 2013-06-16 NOTE — Telephone Encounter (Signed)
Faxed referral and demographics along with insurance card to [facility]

## 2013-06-19 ENCOUNTER — Encounter: Payer: BC Managed Care – PPO | Admitting: Internal Medicine

## 2013-07-08 ENCOUNTER — Encounter: Payer: Self-pay | Admitting: Family Medicine

## 2013-07-08 ENCOUNTER — Ambulatory Visit (INDEPENDENT_AMBULATORY_CARE_PROVIDER_SITE_OTHER): Payer: BC Managed Care – PPO | Admitting: Family Medicine

## 2013-07-08 VITALS — BP 123/83 | HR 78 | Wt 268.0 lb

## 2013-07-08 DIAGNOSIS — E119 Type 2 diabetes mellitus without complications: Secondary | ICD-10-CM

## 2013-07-08 DIAGNOSIS — G4730 Sleep apnea, unspecified: Secondary | ICD-10-CM

## 2013-07-08 DIAGNOSIS — I10 Essential (primary) hypertension: Secondary | ICD-10-CM

## 2013-07-08 LAB — POCT UA - MICROALBUMIN

## 2013-07-08 LAB — POCT GLYCOSYLATED HEMOGLOBIN (HGB A1C): Hemoglobin A1C: 7.1

## 2013-07-08 NOTE — Progress Notes (Signed)
CC: Antonio Rivera is a 57 y.o. male is here for Diabetes   Subjective: HPI:  Followup sleep apnea: He had his home sleep test approximately 2 weeks ago we have not received records from [facility] or [facility].  He reports to 3 nights then he did not use his CPAP machine was hell, he would wake up feeling like he didn't sleep whatsoever. He is back to feeling mostly back to his normal self however his CPAP machine he is currently using has a hose that is leaking. Reports mild daytime sleepiness.  Followup hypertension: No outside blood pressures to report continues on lisinopril without cough, angioedema, chest pain, shortness of breath or orthopnea  Followup type 2 diabetes: Check and blood pressure most mornings fasting blood sugars are ranging between 130-180. Continues on metformin twice a day without known side effects. Denies polyuria polyphasia or polydipsia  Review Of Systems Outlined In HPI  Past Medical History  Diagnosis Date  . OSA on CPAP 1999  . HNP (herniated nucleus pulposus)     C6--7  . Hyperlipidemia   . Diabetes mellitus     type 2  . Hypertension      Family History  Problem Relation Age of Onset  . Cancer Mother     skin  . Cancer Father 34    prostate/died age 69   . Cancer Sister     ovarian cancer  . Cancer Brother     skin cancer     History  Substance Use Topics  . Smoking status: Former Smoker -- 30 years    Types: Pipe  . Smokeless tobacco: Never Used  . Alcohol Use: 0.5 oz/week    1 drink(s) per week     Comment: per week     Objective: Filed Vitals:   07/08/13 1608  BP: 123/83  Pulse: 78    General: Alert and Oriented, No Acute Distress HEENT: Pupils equal, round, reactive to light. Conjunctivae clear.  Moist membranes pharynx unremarkable Lungs: Clear to auscultation bilaterally, no wheezing/ronchi/rales.  Comfortable work of breathing. Good air movement. Cardiac: Regular rate and rhythm. Normal S1/S2.  No murmurs, rubs, nor  gallops.   Abdomen: Obese soft nontender Extremities: No peripheral edema.  Strong peripheral pulses.  Mental Status: No depression, anxiety, nor agitation. Skin: Warm and dry.  Assessment & Plan: Antonio Rivera was seen today for diabetes.  Diagnoses and associated orders for this visit:  Type 2 diabetes mellitus - POCT HgB A1C - POCT UA - Microalbumin  SLEEP APNEA  Essential hypertension, benign    Type 2 diabetes: A1c 7.1 uncontrolled with goal of less than 7. He is going to incorporate much more physical activity at his work instead of increasing metformin. Recheck A1c 3 months. Urine microalbumin creatinine ratio normal today Sleep apnea: Uncontrolled we will request sleep study report to complete his claim for new CPAP equipment Essential hypertension: Controlled continue current regimen of lisinopril  Return in about 3 months (around 10/06/2013).

## 2013-08-13 ENCOUNTER — Encounter: Payer: Self-pay | Admitting: Family Medicine

## 2013-09-01 ENCOUNTER — Ambulatory Visit (HOSPITAL_BASED_OUTPATIENT_CLINIC_OR_DEPARTMENT_OTHER)
Admission: RE | Admit: 2013-09-01 | Discharge: 2013-09-01 | Disposition: A | Discharge status: Home / Self Care | Payer: BC Managed Care – PPO | Source: Ambulatory Visit | Attending: Family Medicine | Admitting: Family Medicine

## 2013-09-01 ENCOUNTER — Encounter: Payer: Self-pay | Admitting: Emergency Medicine

## 2013-09-01 ENCOUNTER — Emergency Department
Admission: EM | Admit: 2013-09-01 | Discharge: 2013-09-01 | Disposition: A | Discharge status: Home / Self Care | Payer: BC Managed Care – PPO | Source: Home / Self Care | Attending: Family Medicine | Admitting: Family Medicine

## 2013-09-01 ENCOUNTER — Ambulatory Visit (HOSPITAL_BASED_OUTPATIENT_CLINIC_OR_DEPARTMENT_OTHER)
Admit: 2013-09-01 | Discharge: 2013-09-01 | Disposition: A | Discharge status: Home / Self Care | Payer: BC Managed Care – PPO | Attending: Family Medicine | Admitting: Family Medicine

## 2013-09-01 DIAGNOSIS — IMO0002 Reserved for concepts with insufficient information to code with codable children: Secondary | ICD-10-CM | POA: Insufficient documentation

## 2013-09-01 DIAGNOSIS — R609 Edema, unspecified: Secondary | ICD-10-CM | POA: Insufficient documentation

## 2013-09-01 DIAGNOSIS — S79919A Unspecified injury of unspecified hip, initial encounter: Secondary | ICD-10-CM

## 2013-09-01 DIAGNOSIS — M1710 Unilateral primary osteoarthritis, unspecified knee: Secondary | ICD-10-CM | POA: Insufficient documentation

## 2013-09-01 DIAGNOSIS — M79609 Pain in unspecified limb: Secondary | ICD-10-CM | POA: Insufficient documentation

## 2013-09-01 DIAGNOSIS — S76309A Unspecified injury of muscle, fascia and tendon of the posterior muscle group at thigh level, unspecified thigh, initial encounter: Secondary | ICD-10-CM

## 2013-09-01 DIAGNOSIS — M25569 Pain in unspecified knee: Secondary | ICD-10-CM | POA: Insufficient documentation

## 2013-09-01 DIAGNOSIS — S79929A Unspecified injury of unspecified thigh, initial encounter: Secondary | ICD-10-CM

## 2013-09-01 DIAGNOSIS — M1712 Unilateral primary osteoarthritis, left knee: Secondary | ICD-10-CM

## 2013-09-01 MED ORDER — MELOXICAM 15 MG PO TABS: 15.0000 mg | ORAL_TABLET | Freq: Every day | ORAL | Status: DC

## 2013-09-01 NOTE — Discharge Instructions (Signed)
Wear ace wrap or brace on left knee daytime.

## 2013-09-01 NOTE — ED Provider Notes (Signed)
CSN: [id number]     Arrival date & time 09/01/13  1341 History   First MD Initiated Contact with Patient 09/01/13 1439     Chief Complaint  Patient presents with  . Leg Swelling    left      HPI Comments: Patient complains of 5 day history of pain in his left posterior thigh and around left knee.  The pain is gradually worsening and awakens him at night.  He recalls no recent injury or change in physical activities. He has a long history of chronic right knee pain/instabilty as a result of a severe sprain when he was a teenager.  Patient is a 58 y.o. male presenting with leg pain. The history is provided by the patient.  Leg Pain Location:  Knee and leg Time since incident:  5 days Leg location:  L upper leg Knee location:  L knee Pain details:    Quality:  Dull and aching   Radiates to: left buttock.   Severity:  Mild   Onset quality:  Gradual   Duration:  5 days   Timing:  Constant   Progression:  Worsening Chronicity:  New Prior injury to area:  No Relieved by:  Nothing Worsened by:  Bearing weight and flexion Ineffective treatments:  None tried Associated symptoms: stiffness   Associated symptoms: no back pain, no decreased ROM, no fever, no muscle weakness, no numbness, no swelling and no tingling   Risk factors: obesity     Past Medical History  Diagnosis Date  . OSA on CPAP 1999  . HNP (herniated nucleus pulposus)     C6--7  . Hyperlipidemia   . Diabetes mellitus     type 2  . Hypertension    Past Surgical History  Procedure Laterality Date  . Ruptured disk surgery  1999    C6--7  . Tonsillectomy  age 70   Family History  Problem Relation Age of Onset  . Cancer Mother     skin  . Cancer Father 33    prostate/died age 53   . Cancer Sister     ovarian cancer  . Cancer Brother     skin cancer   History  Substance Use Topics  . Smoking status: Former Smoker -- 30 years    Types: Pipe  . Smokeless tobacco: Never Used  . Alcohol Use: 0.5 oz/week   1 drink(s) per week     Comment: per week    Review of Systems  Constitutional: Negative for fever.  Musculoskeletal: Positive for stiffness. Negative for back pain.  All other systems reviewed and are negative.    Allergies  Oysters and Prednisone  Home Medications   Current Outpatient Rx  Name  Route  Sig  Dispense  Refill  . ACCU-CHEK AVIVA PLUS test strip      USE AS INSTRUCTED   100 each   1   . AMBULATORY NON FORMULARY MEDICATION      Medication Name: CPAP machine, mask, supplies Dx: OSA   1 Units   0   . aspirin 81 MG EC tablet   Oral   Take 81 mg by mouth as needed.           Marland Kitchen lisinopril (PRINIVIL,ZESTRIL) 10 MG tablet   Oral   Take 1 tablet (10 mg total) by mouth daily.   90 tablet   3   . meloxicam (MOBIC) 15 MG tablet   Oral   Take 1 tablet (15 mg total) by mouth  daily. Take with food each morning   15 tablet   0   . metFORMIN (GLUCOPHAGE) 850 MG tablet   Oral   Take 1 tablet (850 mg total) by mouth 2 (two) times daily with a meal.   180 tablet   3   . Red Yeast Rice 600 MG CAPS      2 caps every evening   2 capsule   1    BP 107/72  Pulse 71  Temp(Src) 98 F (36.7 C) (Oral)  Resp 14  Wt 270 lb (122.471 kg)  SpO2 98% Physical Exam  Nursing note and vitals reviewed. Constitutional: He is oriented to person, place, and time. He appears well-developed and well-nourished. No distress.  Eyes: Conjunctivae are normal. Pupils are equal, round, and reactive to light.  Cardiovascular: Normal heart sounds.   Pulmonary/Chest: Breath sounds normal.  Abdominal: There is no tenderness.  Musculoskeletal:       Left knee: He exhibits normal range of motion, no swelling, no effusion, no ecchymosis, no deformity, no erythema, no LCL laxity, normal patellar mobility, no bony tenderness, normal meniscus and no MCL laxity. Tenderness found. Medial joint line and MCL tenderness noted. No lateral joint line, no LCL and no patellar tendon tenderness  noted.       Left upper leg: He exhibits tenderness. He exhibits no bony tenderness, no swelling, no edema, no deformity and no laceration.       Legs: There is tenderness over the left posterior thigh as noted; pain is elicited by resisted flexion of the left knee while palpating posterior thigh.  No erythema or warmth. Left knee is stable.  Negative McMurray test.  No tenderness below the left knee  Lymphadenopathy:    He has no cervical adenopathy.  Neurological: He is alert and oriented to person, place, and time.  Skin: Skin is warm and dry.    ED Course  Procedures  none    Imaging Review US Venous Img Lower Unilateral Left  09/01/2013   CLINICAL DATA:  Left lower extremity pain and edema.  EXAM: LEFT LOWER EXTREMITY VENOUS DOPPLER ULTRASOUND  TECHNIQUE: Gray-scale sonography with graded compression, as well as color Doppler and duplex ultrasound, were performed to evaluate the deep venous system from the level of the common femoral vein through the popliteal and proximal calf veins. Spectral Doppler was utilized to evaluate flow at rest and with distal augmentation maneuvers.  COMPARISON:  None.  FINDINGS: Thrombus within deep veins:  None visualized.  Compressibility of deep veins:  Normal.  Duplex waveform respiratory phasicity:  Normal.  Duplex waveform response to augmentation:  Normal.  Venous reflux:  None visualized.  Other findings: No evidence of superficial thrombophlebitis or abnormal fluid collection.  IMPRESSION: No evidence of left lower extremity DVT.   Electronically Signed   By: Aletta Edouard M.D.   On: 09/01/2013 18:24   Dg Knee Complete 4 Views Left  09/01/2013   CLINICAL DATA:  Left knee pain.  EXAM: LEFT KNEE - COMPLETE 4+ VIEW  COMPARISON:  None.  FINDINGS: No fracture or dislocation is noted. Mild narrowing of the medial joint space is noted as well as the patellofemoral space. Osteophyte formation is seen involving the patellofemoral space as well as the medial  and lateral joint space. No joint effusion is noted. No soft tissue abnormality is noted.  IMPRESSION: Mild to moderate tricompartmental degenerative joint disease is noted. No acute abnormality is seen in the left knee.   Electronically Signed  By: Sabino Dick M.D.   On: 09/01/2013 16:17      MDM   1. Hamstring injury left leg.  No evidence DVT  2. Left knee DJD    Symptoms in left knee and posterior thigh probably a result of long history of accommodating injury and pain in right knee. Begin Mobic.  Wear ace wrap or brace on left knee daytime. Will arrange follow-up appointment with Dr. Aundria Mems for long term management.    Kandra Nicolas, MD 09/01/13 2020

## 2013-09-01 NOTE — ED Notes (Signed)
Antonio Rivera c/o left leg pain and swelling without injury x 5 days.

## 2013-09-01 NOTE — ED Notes (Signed)
Patient scheduled for Left leg [place] @ [place] @ 4:30 today, 09/01/13. He was given apt time and location while in office.

## 2013-09-02 ENCOUNTER — Telehealth: Payer: Self-pay | Admitting: *Deleted

## 2013-09-08 ENCOUNTER — Ambulatory Visit (INDEPENDENT_AMBULATORY_CARE_PROVIDER_SITE_OTHER): Payer: BC Managed Care – PPO | Admitting: Sports Medicine

## 2013-09-08 ENCOUNTER — Encounter: Payer: Self-pay | Admitting: Sports Medicine

## 2013-09-08 VITALS — BP 125/79 | HR 68 | Ht 72.0 in | Wt 275.0 lb

## 2013-09-08 DIAGNOSIS — M1710 Unilateral primary osteoarthritis, unspecified knee: Secondary | ICD-10-CM

## 2013-09-08 DIAGNOSIS — IMO0002 Reserved for concepts with insufficient information to code with codable children: Secondary | ICD-10-CM

## 2013-09-08 DIAGNOSIS — M170 Bilateral primary osteoarthritis of knee: Secondary | ICD-10-CM | POA: Insufficient documentation

## 2013-09-08 MED ORDER — MELOXICAM 15 MG PO TABS: 15.0000 mg | ORAL_TABLET | Freq: Every day | ORAL | Status: DC

## 2013-09-08 NOTE — Assessment & Plan Note (Signed)
X-rays confirmed tricompartmental arthritis. I do think the pain he had in his posterior leg was more likely due to knee arthritis rather than a hamstring strain. His last injection to the right knee was approximately 4 years ago. I'm going to refill his meloxicam which is highly effective, he will get some home exercises, and I can see him back on an as needed basis.

## 2013-09-08 NOTE — Progress Notes (Signed)
   Subjective:    I'm seeing this patient as a consultation for:  Dr. Assunta Found  CC: Left knee pain  HPI: This is a very pleasant 58 year old male, he was seen in urgent care recently for pain in the posterior aspect of his left knee, worse with weightbearing. He was sent for x-rays, the results of which will be dictated below. He was given meloxicam and now his pain has resolved, it was mild, persistent.   Past medical history, Surgical history, Family history not pertinant except as noted below, Social history, Allergies, and medications have been entered into the medical record, reviewed, and no changes needed.   Review of Systems: No headache, visual changes, nausea, vomiting, diarrhea, constipation, dizziness, abdominal pain, skin rash, fevers, chills, night sweats, weight loss, swollen lymph nodes, body aches, joint swelling, muscle aches, chest pain, shortness of breath, mood changes, visual or auditory hallucinations.   Objective:   General: Well Developed, well nourished, and in no acute distress.  Neuro/Psych: Alert and oriented x3, extra-ocular muscles intact, able to move all 4 extremities, sensation grossly intact. Skin: Warm and dry, no rashes noted.  Respiratory: Not using accessory muscles, speaking in full sentences, trachea midline.  Cardiovascular: Pulses palpable, no extremity edema. Abdomen: Does not appear distended. Left Knee: Tender to palpation at the joint line, no effusion. ROM full in flexion and extension and lower leg rotation. Ligaments with solid consistent endpoints including ACL, PCL, LCL, MCL. Negative Mcmurray's, Apley's, and Thessalonian tests. Non painful patellar compression. Patellar glide without crepitus. Patellar and quadriceps tendons unremarkable. Hamstring and quadriceps strength is normal.    x-rays showed tricompartmental arthritis.   Impression and Recommendations:   This case required medical decision making of moderate complexity. '

## 2013-10-06 ENCOUNTER — Ambulatory Visit (INDEPENDENT_AMBULATORY_CARE_PROVIDER_SITE_OTHER): Payer: BC Managed Care – PPO | Admitting: Family Medicine

## 2013-10-06 ENCOUNTER — Encounter: Payer: Self-pay | Admitting: Family Medicine

## 2013-10-06 VITALS — BP 127/79 | HR 79 | Wt 273.0 lb

## 2013-10-06 DIAGNOSIS — J309 Allergic rhinitis, unspecified: Secondary | ICD-10-CM

## 2013-10-06 DIAGNOSIS — E119 Type 2 diabetes mellitus without complications: Secondary | ICD-10-CM

## 2013-10-06 DIAGNOSIS — I10 Essential (primary) hypertension: Secondary | ICD-10-CM

## 2013-10-06 DIAGNOSIS — G4730 Sleep apnea, unspecified: Secondary | ICD-10-CM

## 2013-10-06 LAB — POCT GLYCOSYLATED HEMOGLOBIN (HGB A1C): HEMOGLOBIN A1C: 7.5

## 2013-10-06 MED ORDER — CETIRIZINE HCL 10 MG PO TABS: 10.0000 mg | ORAL_TABLET | Freq: Every day | ORAL | Status: DC

## 2013-10-06 NOTE — Progress Notes (Signed)
CC: Antonio Rivera is a 58 y.o. male is here for Diabetes and Hypertension   Subjective: HPI:  Followup type 2 diabetes: Continues on metformin 1 g every evening. Denies side effects or GI disturbance. Denies polyuria polyphasia or polydipsia nor poorly healing wounds. He admits that he is frequently snacking on candies but has tried to cut down portions of breakfast lunch and dinner. No outside blood sugars to report.    Followup hypertension continues on lisinopril 10 mg daily without side effects. No outside blood pressures to report. Denies chest pain shortness of breath orthopnea nor peripheral edema. Continues to use CPAP on a nightly basis denying nonrestorative sleep or napping during the day.  His only acute complaint today is nasal congestion there has been present for the past 2-3 weeks it is worse in dusty environments slightly improved with NyQuil or Alka-Seltzer cold and sinus.  Accompanied by sore throat all of which is mild in severity present on a daily basis has not been getting better or worsens since onset.  Denies fevers, chills, cough, shortness of breath, wheezing, nor difficulty swallowing.     Review Of Systems Outlined In HPI  Past Medical History  Diagnosis Date  . OSA on CPAP 1999  . HNP (herniated nucleus pulposus)     C6--7  . Hyperlipidemia   . Diabetes mellitus     type 2  . Hypertension     Past Surgical History  Procedure Laterality Date  . Ruptured disk surgery  1999    C6--7  . Tonsillectomy  age 43   Family History  Problem Relation Age of Onset  . Cancer Mother     skin  . Cancer Father 4    prostate/died age 20   . Cancer Sister     ovarian cancer  . Cancer Brother     skin cancer    History   Social History  . Marital Status: Single    Spouse Name: N/A    Number of Children: N/A  . Years of Education: N/A   Occupational History  . Not on file.   Social History Main Topics  . Smoking status: Former Smoker -- 30 years   Types: Pipe  . Smokeless tobacco: Never Used  . Alcohol Use: 0.5 oz/week    1 drink(s) per week     Comment: per week  . Drug Use: No  . Sexual Activity:    Other Topics Concern  . Not on file   Social History Narrative  . No narrative on file     Objective: BP 127/79  Pulse 79  Wt 273 lb (123.832 kg)  General: Alert and Oriented, No Acute Distress HEENT: Pupils equal, round, reactive to light. Conjunctivae clear.  External ears unremarkable, canals clear with intact TMs with appropriate landmarks.  Middle ear appears open without effusion. Pink inferior turbinates with moderate mucoid discharge.  Moist mucous membranes, pharynx without inflammation nor lesions however moderate cobblestoning.  Neck supple without palpable lymphadenopathy nor abnormal masses. Lungs: Clear to auscultation bilaterally, no wheezing/ronchi/rales.  Comfortable work of breathing. Good air movement. Cardiac: Regular rate and rhythm. Normal S1/S2.  No murmurs, rubs, nor gallops.   Diabetic Foot Exam: Dorsalis pedis pulses 1+ bilaterally.  Monofilament sensation intact on plantar and dorsal surface bilaterally.  No signs of infection, skin breakdown, nor ulceration. Extremities: No peripheral edema.  Strong peripheral pulses.  Mental Status: No depression, anxiety, nor agitation. Skin: Warm and dry.  Assessment & Plan: Kelli was  seen today for diabetes and hypertension.  Diagnoses and associated orders for this visit:  Type 2 diabetes mellitus - POCT HgB A1C  SLEEP APNEA  Essential hypertension, benign  Allergic rhinitis  Other Orders - cetirizine (ZYRTEC) 10 MG tablet; Take 1 tablet (10 mg total) by mouth daily.    Type 2 diabetes: A1c 7.5 uncontrolled today patient is not interested in increasing the metformin would rather focus on his new dancing routine providing physical exertion 2 -3 hours twice a week.  He was given a handout on diet tips with numerous examples of meals to help keep his  blood sugar and check. Sleep apnea: Controlled continue CPAP Essential hypertension: Controlled continue lisinopril Allergic rhinitis: Start over-the-counter Zyrtec   Return in about 3 months (around 01/06/2014).

## 2014-01-06 ENCOUNTER — Ambulatory Visit (INDEPENDENT_AMBULATORY_CARE_PROVIDER_SITE_OTHER): Payer: BC Managed Care – PPO | Admitting: Family Medicine

## 2014-01-06 ENCOUNTER — Encounter: Payer: Self-pay | Admitting: Family Medicine

## 2014-01-06 VITALS — BP 122/71 | HR 82 | Wt 268.0 lb

## 2014-01-06 DIAGNOSIS — E119 Type 2 diabetes mellitus without complications: Secondary | ICD-10-CM

## 2014-01-06 DIAGNOSIS — R209 Unspecified disturbances of skin sensation: Secondary | ICD-10-CM

## 2014-01-06 DIAGNOSIS — R200 Anesthesia of skin: Secondary | ICD-10-CM

## 2014-01-06 DIAGNOSIS — I10 Essential (primary) hypertension: Secondary | ICD-10-CM

## 2014-01-06 LAB — POCT GLYCOSYLATED HEMOGLOBIN (HGB A1C): HEMOGLOBIN A1C: 7.2

## 2014-01-06 MED ORDER — LISINOPRIL 10 MG PO TABS: 10.0000 mg | ORAL_TABLET | Freq: Every day | ORAL | Status: DC

## 2014-01-06 MED ORDER — METFORMIN HCL 850 MG PO TABS: 850.0000 mg | ORAL_TABLET | Freq: Two times a day (BID) | ORAL | Status: DC

## 2014-01-06 NOTE — Progress Notes (Signed)
CC: Antonio Rivera is a 58 y.o. male is here for Diabetes   Subjective: HPI:  Followup type 2 diabetes: He continues on metformin twice a day without known side effects or intolerance. Denies diarrhea. No outside blood sugars to report. He may have had some hypoglycemic episodes this last week described as tremor that occurred after going 8-12 hours without eating and was relieved by eating a handful of m&ms. Denies polyuria polyphagia polydipsia nor poorly healing wounds. Continues on aspirin and taking red yeast rice to replace a statin.  Ultracet hypertension: Continues on lisinopril on a daily basis with no outside blood pressures to report. Denies cough, chest pain, shortness of breath, angioedema, motor or sensory disturbances other than that described below  Reports tingling and numbness in his left scalp that has been present for the past 3 days slowly improving without any intervention. Came on at the same time as a soreness in his occiput after lying on the back of his head while fixing machinery.  Denies motor or sensory disturbances elsewhere no interventions as of yet   Review Of Systems Outlined In HPI  Past Medical History  Diagnosis Date  . OSA on CPAP 1999  . HNP (herniated nucleus pulposus)     C6--7  . Hyperlipidemia   . Diabetes mellitus     type 2  . Hypertension     Past Surgical History  Procedure Laterality Date  . Ruptured disk surgery  1999    C6--7  . Tonsillectomy  age 7   Family History  Problem Relation Age of Onset  . Cancer Mother     skin  . Cancer Father 66    prostate/died age 77   . Cancer Sister     ovarian cancer  . Cancer Brother     skin cancer    History   Social History  . Marital Status: Single    Spouse Name: N/A    Number of Children: N/A  . Years of Education: N/A   Occupational History  . Not on file.   Social History Main Topics  . Smoking status: Former Smoker -- 30 years    Types: Pipe  . Smokeless tobacco:  Never Used  . Alcohol Use: 0.5 oz/week    1 drink(s) per week     Comment: per week  . Drug Use: No  . Sexual Activity:    Other Topics Concern  . Not on file   Social History Narrative  . No narrative on file     Objective: BP 122/71  Pulse 82  Wt 268 lb (121.564 kg)  General: Alert and Oriented, No Acute Distress HEENT: Pupils equal, round, reactive to light. Conjunctivae clear.  Moist membranes pharynx unremarkable Neuro: Cranial nerves II through XII grossly intact Lungs: Clear to auscultation bilaterally, no wheezing/ronchi/rales.  Comfortable work of breathing. Good air movement. Cardiac: Regular rate and rhythm. Normal S1/S2.  No murmurs, rubs, nor gallops.   Extremities: No peripheral edema.  Strong peripheral pulses.  Mental Status: No depression, anxiety, nor agitation. Skin: Warm and dry.  Assessment & Plan: Tron was seen today for diabetes.  Diagnoses and associated orders for this visit:  Type 2 diabetes mellitus - POCT HgB A1C  Essential hypertension, benign - lisinopril (PRINIVIL,ZESTRIL) 10 MG tablet; Take 1 tablet (10 mg total) by mouth daily.  Numbness  DM - metFORMIN (GLUCOPHAGE) 850 MG tablet; Take 1 tablet (850 mg total) by mouth 2 (two) times daily with a meal.  Type 2 diabetes: A1c 7.2 uncontrolled chronic condition I encouraged him to increase his metformin to a total of 2 g daily, he politely declines would prefer to continue success with diet and exercise Numbness of the left forehead: Suspected he has caused a temporary neuropathy of the left occipital nerve which should improve on its own over the next week, is mostly came on due to compression while lying on the back of his head  essential hypertension control, continue lisinopril:    Return in about 3 months (around 04/08/2014) for CPE.

## 2014-01-26 ENCOUNTER — Other Ambulatory Visit: Payer: Self-pay | Admitting: Family Medicine

## 2014-03-23 ENCOUNTER — Ambulatory Visit (INDEPENDENT_AMBULATORY_CARE_PROVIDER_SITE_OTHER): Payer: BC Managed Care – PPO | Admitting: Family Medicine

## 2014-03-23 ENCOUNTER — Emergency Department (HOSPITAL_BASED_OUTPATIENT_CLINIC_OR_DEPARTMENT_OTHER)
Admission: EM | Admit: 2014-03-23 | Discharge: 2014-03-23 | Disposition: A | Discharge status: Home / Self Care | Payer: BC Managed Care – PPO | Attending: Emergency Medicine | Admitting: Emergency Medicine

## 2014-03-23 ENCOUNTER — Emergency Department (HOSPITAL_BASED_OUTPATIENT_CLINIC_OR_DEPARTMENT_OTHER): Payer: BC Managed Care – PPO

## 2014-03-23 ENCOUNTER — Encounter (HOSPITAL_BASED_OUTPATIENT_CLINIC_OR_DEPARTMENT_OTHER): Payer: Self-pay | Admitting: Emergency Medicine

## 2014-03-23 ENCOUNTER — Encounter: Payer: Self-pay | Admitting: Family Medicine

## 2014-03-23 VITALS — BP 129/75 | HR 75 | Wt 265.0 lb

## 2014-03-23 DIAGNOSIS — S8990XA Unspecified injury of unspecified lower leg, initial encounter: Secondary | ICD-10-CM | POA: Insufficient documentation

## 2014-03-23 DIAGNOSIS — I10 Essential (primary) hypertension: Secondary | ICD-10-CM | POA: Insufficient documentation

## 2014-03-23 DIAGNOSIS — G4733 Obstructive sleep apnea (adult) (pediatric): Secondary | ICD-10-CM | POA: Insufficient documentation

## 2014-03-23 DIAGNOSIS — Z87891 Personal history of nicotine dependence: Secondary | ICD-10-CM | POA: Insufficient documentation

## 2014-03-23 DIAGNOSIS — Z9981 Dependence on supplemental oxygen: Secondary | ICD-10-CM | POA: Diagnosis not present

## 2014-03-23 DIAGNOSIS — Z791 Long term (current) use of non-steroidal anti-inflammatories (NSAID): Secondary | ICD-10-CM | POA: Diagnosis not present

## 2014-03-23 DIAGNOSIS — Z79899 Other long term (current) drug therapy: Secondary | ICD-10-CM | POA: Diagnosis not present

## 2014-03-23 DIAGNOSIS — E785 Hyperlipidemia, unspecified: Secondary | ICD-10-CM | POA: Diagnosis not present

## 2014-03-23 DIAGNOSIS — IMO0002 Reserved for concepts with insufficient information to code with codable children: Secondary | ICD-10-CM | POA: Insufficient documentation

## 2014-03-23 DIAGNOSIS — X500XXA Overexertion from strenuous movement or load, initial encounter: Secondary | ICD-10-CM | POA: Insufficient documentation

## 2014-03-23 DIAGNOSIS — T148XXA Other injury of unspecified body region, initial encounter: Secondary | ICD-10-CM

## 2014-03-23 DIAGNOSIS — Y9389 Activity, other specified: Secondary | ICD-10-CM | POA: Diagnosis not present

## 2014-03-23 DIAGNOSIS — R252 Cramp and spasm: Secondary | ICD-10-CM

## 2014-03-23 DIAGNOSIS — S99919A Unspecified injury of unspecified ankle, initial encounter: Secondary | ICD-10-CM | POA: Insufficient documentation

## 2014-03-23 DIAGNOSIS — S99929A Unspecified injury of unspecified foot, initial encounter: Secondary | ICD-10-CM

## 2014-03-23 DIAGNOSIS — Y9289 Other specified places as the place of occurrence of the external cause: Secondary | ICD-10-CM | POA: Diagnosis not present

## 2014-03-23 DIAGNOSIS — E119 Type 2 diabetes mellitus without complications: Secondary | ICD-10-CM | POA: Diagnosis not present

## 2014-03-23 LAB — BASIC METABOLIC PANEL WITH GFR
BUN: 16 mg/dL (ref 6–23)
CALCIUM: 9.1 mg/dL (ref 8.4–10.5)
CHLORIDE: 99 meq/L (ref 96–112)
CO2: 28 meq/L (ref 19–32)
CREATININE: 0.85 mg/dL (ref 0.50–1.35)
GFR, Est African American: >89 mL/min
Glucose, Bld: 168 mg/dL — ABNORMAL HIGH (ref 70–99)
Potassium: 4.6 mEq/L (ref 3.5–5.3)
SODIUM: 135 meq/L (ref 135–145)

## 2014-03-23 MED ORDER — KETOROLAC TROMETHAMINE 60 MG/2ML IM SOLN
60.0000 mg | Freq: Once | INTRAMUSCULAR | Status: AC
  Administered 2014-03-23: 60 mg via INTRAMUSCULAR

## 2014-03-23 MED ORDER — DIAZEPAM 5 MG PO TABS: 2.5000 mg | ORAL_TABLET | Freq: Two times a day (BID) | ORAL | Status: DC | PRN

## 2014-03-23 NOTE — Progress Notes (Signed)
CC: Antonio Rivera is a 58 y.o. male is here for right leg cramp   Subjective: HPI:  Complains of cramp in the right medial thigh that has been present since he awoke this morning. Symptoms began seconds after he got up to walk away from his bed. There were severe in severity at onset now moderate in severity. Worse when standing up trying to ambulate. No interventions as of yet other than stretching. Nothing particularly makes it better. States he's had cramp like this in the past when he was younger that required gabapentin on a daily basis. Denies any other new motor sensory disturbances. Denies weakness or any recent trauma or overexertion. Denies any overlying skin changes   Review Of Systems Outlined In HPI  Past Medical History  Diagnosis Date  . OSA on CPAP 1999  . HNP (herniated nucleus pulposus)     C6--7  . Hyperlipidemia   . Diabetes mellitus     type 2  . Hypertension     Past Surgical History  Procedure Laterality Date  . Ruptured disk surgery  1999    C6--7  . Tonsillectomy  age 33   Family History  Problem Relation Age of Onset  . Cancer Mother     skin  . Cancer Father 67    prostate/died age 78   . Cancer Sister     ovarian cancer  . Cancer Brother     skin cancer    History   Social History  . Marital Status: Single    Spouse Name: N/A    Number of Children: N/A  . Years of Education: N/A   Occupational History  . Not on file.   Social History Main Topics  . Smoking status: Former Smoker -- 30 years    Types: Pipe  . Smokeless tobacco: Never Used  . Alcohol Use: 0.5 oz/week    1 drink(s) per week     Comment: per week  . Drug Use: No  . Sexual Activity:    Other Topics Concern  . Not on file   Social History Narrative  . No narrative on file     Objective: BP 129/75  Pulse 75  Wt 265 lb (120.203 kg)  Vital signs reviewed. General: Alert and Oriented, No Acute Distress HEENT: Pupils equal, round, reactive to light.  Conjunctivae clear.  External ears unremarkable.  Moist mucous membranes. Lungs: Clear and comfortable work of breathing, speaking in full sentences without accessory muscle use. Cardiac: Regular rate and rhythm.  Neuro: CN II-XII grossly intact, gait normal. Extremities: No peripheral edema.  Strong peripheral pulses. Full range of motion and strength of the right lower extremity without weakness  Mental Status: No depression, anxiety, nor agitation. Logical though process. Skin: Warm and dry.  Assessment & Plan: Zacharey was seen today for right leg cramp.  Diagnoses and associated orders for this visit:  Muscle cramp - BASIC METABOLIC PANEL WITH GFR - diazepam (VALIUM) 5 MG tablet; Take 0.5-1 tablets (2.5-5 mg total) by mouth every 12 (twelve) hours as needed for muscle spasms. - ketorolac (TORADOL) injection 60 mg; Inject 2 mLs (60 mg total) into the muscle once.    Muscle cramp: Checking electrolytes, he received Toradol here in the clinic due to his degree of pain. I offered him diazepam to help with sleep in the evening if any spasms return or persist. If blood work is normal we will offer him gabapentin.   Return if symptoms worsen or fail to  improve.

## 2014-03-23 NOTE — ED Notes (Signed)
EDP at patient bedside for assessment/update  

## 2014-03-23 NOTE — Discharge Instructions (Signed)
Place an ice pack on painful areas 4 times daily for 30 minutes at a time. It is okay to take the valium prescibed by your doctor earlier today. Follow up with you doctor tomorrow to get blood test results, use your crutches as needed. See your doctor in not feeling improved in 2 or 3 days

## 2014-03-23 NOTE — ED Notes (Addendum)
Pt c/o thigh and calf pain sudden onset this am described as cramping. Seen by PMD today for same given " muscle relaxer anti inflammatory shot  has not filed yet

## 2014-03-23 NOTE — ED Provider Notes (Addendum)
CSN: [id number]     Arrival date & time 03/23/14  1948 History  This chart was scribed for Orlie Dakin, MD by Ladene Artist, ED Scribe. The patient was seen in room MH10/MH10. Patient's care was started at 8:29 PM.   Chief Complaint  Patient presents with  . Leg Pain   Patient is a 58 y.o. male presenting with leg pain. The history is provided by the patient. No language interpreter was used.  Leg Pain Location:  Leg Injury: no   Leg location:  R lower leg and R upper leg Pain details:    Quality:  Cramping and tearing   Radiates to:  R leg   Severity:  Moderate   Onset quality:  Sudden   Timing:  Intermittent Chronicity:  New Dislocation: no   Worsened by:  Bearing weight Ineffective treatments:  None tried  HPI Comments: Antonio Rivera is a 58 y.o. male, with a h/o DM and HTN, who presents to the Emergency Department complaining of R inner thigh muscle cramp this morning. Pt states that he felt a "popping" sensation which resolved after 15 minutes. Pt states that his R thigh tightened up when he attempted to get out of the car once he got to work. He states that pain radiates down to R calf. He describes the pain as "someone ripping the muscle apart". Sitting down improves pain. Standing up and movements exacerbate pain. Pt states that he usually has cramps often but states that today's cramping was more severe. Pt saw his doctor earlier today and was given an anti-inflammatory injection and had labs drawn to include blood sugar, potassium and calcium. Results pending until tomorrow. Pain is minimal when he lies still worse when he moves his leg. It is crampy in quality. the PCP: Marcial Pacas  Past Medical History  Diagnosis Date  . OSA on CPAP 1999  . HNP (herniated nucleus pulposus)     C6--7  . Hyperlipidemia   . Diabetes mellitus     type 2  . Hypertension    Past Surgical History  Procedure Laterality Date  . Ruptured disk surgery  1999    C6--7  . Tonsillectomy   age 43   Family History  Problem Relation Age of Onset  . Cancer Mother     skin  . Cancer Father 62    prostate/died age 63   . Cancer Sister     ovarian cancer  . Cancer Brother     skin cancer   History  Substance Use Topics  . Smoking status: Former Smoker -- 30 years    Types: Pipe  . Smokeless tobacco: Never Used  . Alcohol Use: 0.5 oz/week    1 drink(s) per week     Comment: per week    Review of Systems  Constitutional: Negative.   HENT: Negative.   Respiratory: Negative.   Cardiovascular: Negative.   Gastrointestinal: Negative.   Musculoskeletal: Positive for myalgias.  Skin: Negative.   Allergic/Immunologic: Positive for immunocompromised state.       Diabetic  Neurological: Negative.   Psychiatric/Behavioral: Negative.   All other systems reviewed and are negative.  Allergies  Oysters and Prednisone  Home Medications   Prior to Admission medications   Medication Sig Start Date End Date Taking? Authorizing Provider  ACCU-CHEK AVIVA PLUS test strip USE AS INSTRUCTED 05/29/13   Marcial Pacas, DO  AMBULATORY NON FORMULARY MEDICATION Medication Name: CPAP machine, mask, supplies Dx: OSA 02/24/11   Loyal Gambler, DO  aspirin 81 MG EC tablet Take 81 mg by mouth as needed.      Historical Provider, MD  cetirizine (ZYRTEC) 10 MG tablet Take 1 tablet (10 mg total) by mouth daily. 10/06/13   Sean Hommel, DO  diazepam (VALIUM) 5 MG tablet Take 0.5-1 tablets (2.5-5 mg total) by mouth every 12 (twelve) hours as needed for muscle spasms. 03/23/14   Sean Hommel, DO  lisinopril (PRINIVIL,ZESTRIL) 10 MG tablet Take 1 tablet (10 mg total) by mouth daily. 01/06/14   Marcial Pacas, DO  meloxicam (MOBIC) 15 MG tablet Take 1 tablet (15 mg total) by mouth daily. Take with food each morning 09/08/13   Silverio Decamp, MD  metFORMIN (GLUCOPHAGE) 850 MG tablet Take 1 tablet (850 mg total) by mouth 2 (two) times daily with a meal. 01/06/14   Marcial Pacas, DO  Red Yeast Rice 600 MG CAPS 2 caps  every evening 04/02/13   Marcial Pacas, DO   Triage Vitals: BP 121/64  Pulse 86  Temp(Src) 98.2 F (36.8 C) (Oral)  Resp 16  Ht 6' (1.829 m)  Wt 265 lb (120.203 kg)  BMI 35.93 kg/m2  SpO2 97% Physical Exam  Nursing note and vitals reviewed. Constitutional: He appears well-developed and well-nourished.  HENT:  Head: Normocephalic and atraumatic.  Eyes: Conjunctivae are normal. Pupils are equal, round, and reactive to light.  Neck: Neck supple. No tracheal deviation present. No thyromegaly present.  Cardiovascular: Normal rate and regular rhythm.   No murmur heard. Pulmonary/Chest: Effort normal and breath sounds normal.  Abdominal: Soft. Bowel sounds are normal. He exhibits no distension. There is no tenderness.  Musculoskeletal: Normal range of motion. He exhibits no edema and no tenderness.  Right lower ecchymotic at anteromedial thigh and distal posterior thigh immediately above the flexor crease of the knee. No swelling, mild tenderness. DP pulse 2+. Not red or warm in comparison to contralateral leg All other extremities without swelling redness or tenderness are neurovascular intact  Neurological: He is alert. Coordination normal.  Skin: Skin is warm and dry. No rash noted.  Psychiatric: He has a normal mood and affect.    ED Course  Procedures (including critical care time) DIAGNOSTIC STUDIES: Oxygen Saturation is 97% on RA, normal by my interpretation.    COORDINATION OF CARE: 8:38 PM-Discussed treatment plan which includes XR with pt at bedside and pt agreed to plan.   Labs Review Labs Reviewed - No data to display  Imaging Review Dg Femur Right  03/23/2014   CLINICAL DATA:  Leg pain.  No indication of injury.  EXAM: RIGHT FEMUR - 2 VIEW  COMPARISON:  None.  FINDINGS: There is no evidence of fracture or other focal bone lesions. Soft tissues are unremarkable. Tricompartmental knee osteoarthritis with moderate medial compartment narrowing and extensive marginal spurring.  There could be a small knee joint effusion.  IMPRESSION: 1. No acute findings. 2. Tricompartmental knee osteoarthritis.   Electronically Signed   By: Jorje Guild M.D.   On: 03/23/2014 21:05   Dg Tibia/fibula Right  03/23/2014   CLINICAL DATA:  Post leg cramp, now with bruising involving the posterior aspect of the femur.  EXAM: RIGHT TIBIA AND FIBULA - 2 VIEW  COMPARISON:  Right femur radiographs - earlier same day  FINDINGS: No fracture or dislocation. Regional soft tissues appear normal. No radiopaque foreign body.  Moderate to severe tricompartmental degenerative change of the knee, worse within the medial compartment and patellofemoral joints with joint space loss, articular surface irregularity, subchondral  sclerosis and osteophytosis. No definite knee joint effusion.  Limited visualization of the ankle is normal given obliquity and large field of view. Small plantar calcaneal spur. Minimal enthesopathic change of the Achilles tendon insertion site.  IMPRESSION: 1. No acute findings. 2. Moderate to severe tricompartmental degenerative change of the knee. 3. Small plantar calcaneal spur.   Electronically Signed   By: Sandi Mariscal M.D.   On: 03/23/2014 21:07    EKG Interpretation None      MDM  Symptoms exam not consistent with phlebitis, is likely consistent with muscle tear and muscle cramps Final diagnoses:  None   Xrays viewed by me.plan Ice pt has rx for valium writen by pmd today, has crutches at home to use prn  Dx muscle tear I personally performed the services described in this documentation, which was scribed in my presence. The recorded information has been reviewed and is accurate.    Orlie Dakin, MD 03/23/14 2157  Orlie Dakin, MD 03/23/14 2158

## 2014-03-24 ENCOUNTER — Telehealth: Payer: Self-pay | Admitting: Family Medicine

## 2014-03-24 DIAGNOSIS — R252 Cramp and spasm: Secondary | ICD-10-CM | POA: Insufficient documentation

## 2014-03-24 MED ORDER — GABAPENTIN 300 MG PO CAPS: 300.0000 mg | ORAL_CAPSULE | Freq: Every day | ORAL | Status: DC

## 2014-03-24 NOTE — Telephone Encounter (Signed)
Antonio Rivera, Will you please let patient know that his blood work was normal other than an elevated blood sugar which was expected.  Specifically potassium, calcium, and sodium were all normal.  I'd recommend he start a nightly dose of gabapentin in hopes of preventing future cramping.  I've sent an Rx of this to his [facility]

## 2014-03-24 NOTE — Telephone Encounter (Signed)
Pt.notified

## 2014-03-30 ENCOUNTER — Telehealth: Payer: Self-pay | Admitting: Family Medicine

## 2014-03-30 ENCOUNTER — Ambulatory Visit (INDEPENDENT_AMBULATORY_CARE_PROVIDER_SITE_OTHER): Payer: BC Managed Care – PPO | Admitting: Family Medicine

## 2014-03-30 ENCOUNTER — Encounter: Payer: Self-pay | Admitting: Family Medicine

## 2014-03-30 ENCOUNTER — Ambulatory Visit: Payer: BC Managed Care – PPO

## 2014-03-30 VITALS — BP 118/73 | HR 77 | Ht 69.75 in | Wt 278.0 lb

## 2014-03-30 DIAGNOSIS — Z0000 Encounter for general adult medical examination without abnormal findings: Secondary | ICD-10-CM

## 2014-03-30 DIAGNOSIS — M7989 Other specified soft tissue disorders: Secondary | ICD-10-CM

## 2014-03-30 DIAGNOSIS — E785 Hyperlipidemia, unspecified: Secondary | ICD-10-CM

## 2014-03-30 DIAGNOSIS — Z8042 Family history of malignant neoplasm of prostate: Secondary | ICD-10-CM

## 2014-03-30 DIAGNOSIS — Z23 Encounter for immunization: Secondary | ICD-10-CM

## 2014-03-30 DIAGNOSIS — E119 Type 2 diabetes mellitus without complications: Secondary | ICD-10-CM

## 2014-03-30 LAB — BASIC METABOLIC PANEL
BUN: 12 mg/dL (ref 6–23)
CALCIUM: 9.2 mg/dL (ref 8.4–10.5)
CO2: 27 meq/L (ref 19–32)
CREATININE: 0.75 mg/dL (ref 0.50–1.35)
Chloride: 105 mEq/L (ref 96–112)
GLUCOSE: 190 mg/dL — AB (ref 70–99)
Potassium: 4.6 mEq/L (ref 3.5–5.3)
SODIUM: 139 meq/L (ref 135–145)

## 2014-03-30 LAB — LIPID PANEL
CHOL/HDL RATIO: 3.5 ratio
Cholesterol: 168 mg/dL (ref 0–200)
HDL: 48 mg/dL (ref >39)
LDL Cholesterol: 82 mg/dL (ref 0–99)
TRIGLYCERIDES: 190 mg/dL — AB (ref <150)
VLDL: 38 mg/dL (ref 0–40)

## 2014-03-30 LAB — HEMOGLOBIN A1C
HEMOGLOBIN A1C: 7.8 % — AB (ref <5.7)
Mean Plasma Glucose: 177 mg/dL — ABNORMAL HIGH (ref <117)

## 2014-03-30 NOTE — Progress Notes (Signed)
CC: Antonio Rivera is a 58 y.o. male is here for leg issues   Subjective: HPI:  Colonoscopy: 2014 repeat 2019 Prostate: Discussed screening risks/beneifts with patient during today's visit, he would like a PSA obtained primary hyperPTH, IBD/Malabsorption/Cirrhosis.)  Influenza Vaccine: He will receive this today Pneumovax: No current indication Td/Tdap: Up to date Zoster: (Start 59 yo)  Presents for complete physical exam with a complaint of right leg swelling localized to the anterior and posterior lower leg without any involvement in the foot. Symptoms are worse after standing for long periods of time and slightly improved with elevation. Symptoms began the day after a muscle spasm in his right leg and over the past 2 or 3 days he's had warmth, redness and some bruising in the calf region and just anterior to that as well.  Reports pain while walking which is absent at rest. He denies any weakness in the extremity  Review of Systems - General ROS: negative for - chills, fever, night sweats, weight gain or weight loss Ophthalmic ROS: negative for - decreased vision Psychological ROS: negative for - anxiety or depression ENT ROS: negative for - hearing change, nasal congestion, tinnitus or allergies Hematological and Lymphatic ROS: negative for - bleeding problems, bruising or swollen lymph nodes Breast ROS: negative Respiratory ROS: no cough, shortness of breath, or wheezing Cardiovascular ROS: no chest pain or dyspnea on exertion Gastrointestinal ROS: no abdominal pain, change in bowel habits, or black or bloody stools Genito-Urinary ROS: negative for - genital discharge, genital ulcers, incontinence or abnormal bleeding from genitals Musculoskeletal ROS: negative for - joint pain or muscle pain other than that described above Neurological ROS: negative for - headaches or memory loss Dermatological ROS: negative for lumps, mole changes, rash and skin lesion changes other than that  described above  Past Medical History  Diagnosis Date  . OSA on CPAP 1999  . HNP (herniated nucleus pulposus)     C6--7  . Hyperlipidemia   . Diabetes mellitus     type 2  . Hypertension     Past Surgical History  Procedure Laterality Date  . Ruptured disk surgery  1999    C6--7  . Tonsillectomy  age 59   Family History  Problem Relation Age of Onset  . Cancer Mother     skin  . Cancer Father 41    prostate/died age 8   . Cancer Sister     ovarian cancer  . Cancer Brother     skin cancer    History   Social History  . Marital Status: Single    Spouse Name: N/A    Number of Children: N/A  . Years of Education: N/A   Occupational History  . Not on file.   Social History Main Topics  . Smoking status: Former Smoker -- 30 years    Types: Pipe  . Smokeless tobacco: Never Used  . Alcohol Use: 0.5 oz/week    1 drink(s) per week     Comment: per week  . Drug Use: No  . Sexual Activity: Not on file   Other Topics Concern  . Not on file   Social History Narrative  . No narrative on file     Objective: BP 118/73  Pulse 77  Ht 5' 9.75" (1.772 m)  Wt 278 lb (126.1 kg)  BMI 40.16 kg/m2  General: No Acute Distress HEENT: Atraumatic, normocephalic, conjunctivae normal without scleral icterus.  No nasal discharge, hearing grossly intact, TMs with good landmarks  bilaterally with no middle ear abnormalities, posterior pharynx clear without oral lesions. Neck: Supple, trachea midline, no cervical nor supraclavicular adenopathy. Pulmonary: Clear to auscultation bilaterally without wheezing, rhonchi, nor rales. Cardiac: Regular rate and rhythm.  No murmurs, rubs, nor gallops. 1+ nonpitting edema in the right calf and shin.  2+ peripheral pulses bilaterally. Abdomen: Bowel sounds normal.  No masses.  Non-tender without rebound.  Negative Murphy's sign. GU: Bilateral descended testes without inguinal hernia MSK: Grossly intact, no signs of weakness.  Full strength  throughout upper and lower extremities.  Full ROM in upper and lower extremities.  No midline spinal tenderness. Neuro: Gait unremarkable, CN II-XII grossly intact.  C5-C6 Reflex 2/4 Bilaterally, L4 Reflex 2/4 Bilaterally.  Cerebellar function intact. Skin: No rashes. Mild erythema and scattered patches of ecchymoses on the right lower extremity sparing the foot and nothing above the knee Psych: Alert and oriented to person/place/time.  Thought process normal. No anxiety/depression. Assessment & Plan: Antonio Rivera was seen today for leg issues.  Diagnoses and associated orders for this visit:  Annual physical exam  Right leg swelling - Cancel: Lower Extremity Venous Duplex Right; Future - Lower Extremity Venous Duplex Right - US Venous Img Lower Unilateral Right  Hyperlipidemia LDL goal <100 - Lipid panel  Family history of prostate cancer - PSA  Type 2 diabetes mellitus without complication - Basic Metabolic Panel (BMET) - Hemoglobin A1c  Needs flu shot - Flu Vaccine QUAD 36+ mos IM    Healthy lifestyle interventions including but not limited to regular exercise, a healthy low fat diet, moderation of salt intake, the dangers of tobacco/alcohol/recreational drug use, nutrition supplementation, and accident avoidance were discussed with the patient and a handout was provided for future reference. Arranging a venous ultrasound for him this morning to rule out a DVT in the right lower extremity  Return if symptoms worsen or fail to improve.

## 2014-03-30 NOTE — Telephone Encounter (Signed)
Patient Going on cruise soon and wanted to get a note stating that he needs to take his CPAP machine with him for medical purposes.  Call when note is ready to pick up.  thanks

## 2014-03-30 NOTE — Patient Instructions (Signed)
Dr. Coralynn Gaona's General Advice Following Your Complete Physical Exam  The Benefits of Regular Exercise: Unless you suffer from an uncontrolled cardiovascular condition, studies strongly suggest that regular exercise and physical activity will add to both the quality and length of your life.  The World Health Organization recommends 150 minutes of moderate intensity aerobic activity every week.  This is best split over 3-4 days a week, and can be as simple as a brisk walk for just over 35 minutes "most days of the week".  This type of exercise has been shown to lower LDL-Cholesterol, lower average blood sugars, lower blood pressure, lower cardiovascular disease risk, improve memory, and increase one's overall sense of wellbeing.  The addition of anaerobic (or "strength training") exercises offers additional benefits including but not limited to increased metabolism, prevention of osteoporosis, and improved overall cholesterol levels.  How Can I Strive For A Low-Fat Diet?: Current guidelines recommend that 25-35 percent of your daily energy (food) intake should come from fats.  One might ask how can this be achieved without having to dissect each meal on a daily basis?  Switch to skim or 1% milk instead of whole milk.  Focus on lean meats such as ground turkey, fresh fish, baked chicken, and lean cuts of beef as your source of dietary protein.  Limit saturated fat consumption to less than 10% of your daily caloric intake.  Limit trans fatty acid consumption primarily by limiting synthetic trans fats such as partially hydrogenated oils (Ex: fried fast foods).  Substitute olive or vegetable oil for solid fats where possible.  Moderation of Salt Intake: Provided you don't carry a diagnosis of congestive heart failure nor renal failure, I recommend a daily allowance of no more than 2300 mg of salt (sodium).  Keeping under this daily goal is associated with a decreased risk of cardiovascular events, creeping  above it can lead to elevated blood pressures and increases your risk of cardiovascular events.  Milligrams (mg) of salt is listed on all nutrition labels, and your daily intake can add up faster than you think.  Most canned and frozen dinners can pack in over half your daily salt allowance in one meal.    Lifestyle Health Risks: Certain lifestyle choices carry specific health risks.  As you may already know, tobacco use has been associated with increasing one's risk of cardiovascular disease, pulmonary disease, numerous cancers, among many other issues.  What you may not know is that there are medications and nicotine replacement strategies that can more than double your chances of successfully quitting.  I would be thrilled to help manage your quitting strategy if you currently use tobacco products.  When it comes to alcohol use, I've yet to find an "ideal" daily allowance.  Provided an individual does not have a medical condition that is exacerbated by alcohol consumption, general guidelines determine "safe drinking" as no more than two standard drinks for a man or no more than one standard drink for a male per day.  However, much debate still exists on whether any amount of alcohol consumption is technically "safe".  My general advice, keep alcohol consumption to a minimum for general health promotion.  If you or others believe that alcohol, tobacco, or recreational drug use is interfering with your life, I would be happy to provide confidential counseling regarding treatment options.  General "Over The Counter" Nutrition Advice: Postmenopausal women should aim for a daily calcium intake of 1200 mg, however a significant portion of this might already be   provided by diets including milk, yogurt, cheese, and other dairy products.  Vitamin D has been shown to help preserve bone density, prevent fatigue, and has even been shown to help reduce falls in the elderly.  Ensuring a daily intake of 800 Units of  Vitamin D is a good place to start to enjoy the above benefits, we can easily check your Vitamin D level to see if you'd potentially benefit from supplementation beyond 800 Units a day.  Folic Acid intake should be of particular concern to women of childbearing age.  Daily consumption of 400-800 mcg of Folic Acid is recommended to minimize the chance of spinal cord defects in a fetus should pregnancy occur.    For many adults, accidents still remain one of the most common culprits when it comes to cause of death.  Some of the simplest but most effective preventitive habits you can adopt include regular seatbelt use, proper helmet use, securing firearms, and regularly testing your smoke and carbon monoxide detectors.  Antonio Damman B. Jordie Skalsky DO [facility] [address], [address] [place], [place] [address] Phone: [phone]  

## 2014-03-30 NOTE — Telephone Encounter (Addendum)
Meloxicam that was Rxed by Dr. Darene Lamer in the past will provide an anti-inflammatory benefit, let me know if he needs refills.  Icing, elevation, and compression would be the only other recommendations at this point.

## 2014-03-30 NOTE — Telephone Encounter (Signed)
Pt wants to know if he needs an anti inflammatory

## 2014-03-30 NOTE — Telephone Encounter (Signed)
Pt.notified

## 2014-03-31 ENCOUNTER — Telehealth: Payer: Self-pay | Admitting: Family Medicine

## 2014-03-31 DIAGNOSIS — E119 Type 2 diabetes mellitus without complications: Secondary | ICD-10-CM

## 2014-03-31 LAB — PSA: PSA: 0.71 ng/mL (ref <=4.00)

## 2014-03-31 MED ORDER — METFORMIN HCL 1000 MG PO TABS: 1000.0000 mg | ORAL_TABLET | Freq: Two times a day (BID) | ORAL | Status: DC

## 2014-03-31 NOTE — Telephone Encounter (Signed)
Antonio Rivera, Will you please let patient know that his PSA prostate test was normal and his LDL cholesterol is at goal.  A1c was 7.8 with a goal of less than 7.0, this represents uncontrolled blood sugar and is further represented with an elevated triglyceride level.  This can be improved with increasing his metformin to 1,000mg  twice a day with meals. I've sent a new Rx to his pharmacy.  Also, if he would like to try tramadol to help control his leg pain please let me know.

## 2014-03-31 NOTE — Telephone Encounter (Signed)
Left message on vm with results  

## 2014-04-21 ENCOUNTER — Ambulatory Visit (INDEPENDENT_AMBULATORY_CARE_PROVIDER_SITE_OTHER): Payer: BC Managed Care – PPO | Admitting: Sports Medicine

## 2014-04-21 ENCOUNTER — Encounter: Payer: Self-pay | Admitting: Sports Medicine

## 2014-04-21 VITALS — BP 121/73 | HR 71 | Ht 72.0 in | Wt 281.0 lb

## 2014-04-21 DIAGNOSIS — I8310 Varicose veins of unspecified lower extremity with inflammation: Secondary | ICD-10-CM | POA: Diagnosis not present

## 2014-04-21 DIAGNOSIS — I872 Venous insufficiency (chronic) (peripheral): Secondary | ICD-10-CM | POA: Insufficient documentation

## 2014-04-21 MED ORDER — DOXYCYCLINE HYCLATE 100 MG PO TABS: 100.0000 mg | ORAL_TABLET | Freq: Two times a day (BID) | ORAL | Status: AC

## 2014-04-21 NOTE — Patient Instructions (Signed)

## 2014-04-21 NOTE — Progress Notes (Signed)
Patient ID: Antonio Rivera, male   DOB: 06-Jan-1956, 58 y.o.   MRN: [id number]  Subjective:    CC: Right leg pain  HPI: Antonio Rivera is a very pleasant 58 year old man who presents with 4 weeks of right leg pain with swelling and erythema. He states the pain started after stepping out of his bed, when he felt an instant, severe cramping sensation over the medial thigh. Shortly after this cramping sensation subsided, he noted significant bruising over the right calf that over the course of 24 hours spread to cover the entire leg from ankle to groin. States that the bruising and swelling has almost completely resolved; however, he continues to have pain, pruritus, erythema, and edema over the shin and calf. No history of trauma to the leg. Pain is relieved with elevation of the leg and exacerbated with prolonged sitting. He has also been wearing compression tights, which do help to decrease the swelling. He was seen by Dr. Ileene Rubens on 8/24, who ordered a venous doppler ultrasound and successfully ruled out DVT.  Past medical history, Surgical history, Family history not pertinant except as noted below, Social history, Allergies, and medications have been entered into the medical record, reviewed, and no changes needed.   Review of Systems: No fevers, chills, night sweats, weight loss, chest pain, or shortness of breath.   Objective:    General: Well developed, well nourished, and in no acute distress.  Neuro: Alert and oriented x3, extra-ocular muscles intact, sensation grossly intact.  HEENT: Normocephalic, atraumatic, pupils equal round reactive to light, neck supple. Skin: Warm and dry. Erythema with scaling and hyperpigmentation over the right shin and calf. Cardiac: Regular rate and rhythm, no murmurs rubs or gallops. Pitting lower extremity edema is present.  Respiratory: Clear to auscultation bilaterally. Not using accessory muscles, speaking in full sentences. Right Leg: Pitting edema  present over the calf, shin, and ankle.  Erythema with scaling and hyperpigmentation present over the shin and calf. Tenderness to palpation diffusely over the shin and calf, as well as the medial thigh. Full strength and range of motion throughout.   Unna boot placed on the right lower extremity.  Impression and Recommendations:   Right leg pain: Venous stasis dermatitis is the most likely diagnosis in this patient with continued swelling, erythema, scaling, hyperpigmentation and pruritus of the right calf and shin.  - Unna boot placed in office today. - Return weekly x4 weeks for nurse visits for [employer].

## 2014-04-21 NOTE — Assessment & Plan Note (Signed)
Acute, adding doxycycline. Unna boot placed. Return weekly for nurse visits for [employer].

## 2014-04-28 ENCOUNTER — Ambulatory Visit (INDEPENDENT_AMBULATORY_CARE_PROVIDER_SITE_OTHER): Payer: BC Managed Care – PPO | Admitting: Family Medicine

## 2014-04-28 VITALS — Ht 71.0 in | Wt 278.0 lb

## 2014-04-28 DIAGNOSIS — I872 Venous insufficiency (chronic) (peripheral): Secondary | ICD-10-CM

## 2014-04-28 DIAGNOSIS — I8310 Varicose veins of unspecified lower extremity with inflammation: Secondary | ICD-10-CM

## 2014-04-28 NOTE — Progress Notes (Signed)
   Subjective:    Patient ID: Antonio Rivera, male    DOB: 07/25/1956, 58 y.o.   MRN: [id number]  HPI Patient reports today for [facility] boot change of his right lower leg. This was done without complication. He reports that the swelling is down and his leg is feeling better. Margette Fast, CMA    Review of Systems     Objective:   Physical Exam        Assessment & Plan:

## 2014-05-05 ENCOUNTER — Ambulatory Visit (INDEPENDENT_AMBULATORY_CARE_PROVIDER_SITE_OTHER): Payer: BC Managed Care – PPO | Admitting: Sports Medicine

## 2014-05-05 VITALS — BP 100/59 | HR 67 | Ht 71.0 in | Wt 275.0 lb

## 2014-05-05 DIAGNOSIS — I8310 Varicose veins of unspecified lower extremity with inflammation: Secondary | ICD-10-CM

## 2014-05-05 DIAGNOSIS — I872 Venous insufficiency (chronic) (peripheral): Secondary | ICD-10-CM

## 2014-05-05 NOTE — Progress Notes (Signed)
   Subjective:    Patient ID: Antonio Rivera, male    DOB: November 08, 1955, 58 y.o.   MRN: [id number]  HPI Kumar reported today for [facility] dressing change of his right lower leg and foot. He reports that the swelling is going down and he is not experiencing as much pain as he was before. He denies fever at this time also. Margette Fast, CMA    Review of Systems     Objective:   Physical Exam        Assessment & Plan:  Azaan will RTC next week for scheduled dressing change.

## 2014-05-05 NOTE — Assessment & Plan Note (Signed)
Unna boot changed as above.

## 2014-05-12 ENCOUNTER — Ambulatory Visit (INDEPENDENT_AMBULATORY_CARE_PROVIDER_SITE_OTHER): Payer: BC Managed Care – PPO | Admitting: Sports Medicine

## 2014-05-12 VITALS — BP 130/78 | HR 72 | Wt 278.0 lb

## 2014-05-12 DIAGNOSIS — I872 Venous insufficiency (chronic) (peripheral): Secondary | ICD-10-CM

## 2014-05-12 DIAGNOSIS — I8311 Varicose veins of right lower extremity with inflammation: Secondary | ICD-10-CM

## 2014-05-12 NOTE — Assessment & Plan Note (Signed)
Repeat Unna boot as above.

## 2014-05-12 NOTE — Progress Notes (Signed)
   Subjective:    Patient ID: Antonio Rivera, male    DOB: Mar 06, 1956, 58 y.o.   MRN: [id number]  HPI  Caius reported to clinic today for [facility] boot change on his right lower leg. He denies pain in his right leg. He reports that the swelling is better than before. Margette Fast, CMA    Review of Systems     Objective:   Physical Exam        Assessment & Plan:

## 2014-05-19 ENCOUNTER — Encounter: Payer: Self-pay | Admitting: Sports Medicine

## 2014-05-19 ENCOUNTER — Ambulatory Visit (INDEPENDENT_AMBULATORY_CARE_PROVIDER_SITE_OTHER): Payer: BC Managed Care – PPO | Admitting: Sports Medicine

## 2014-05-19 VITALS — BP 121/75 | HR 80 | Ht 72.0 in | Wt 272.0 lb

## 2014-05-19 DIAGNOSIS — I8311 Varicose veins of right lower extremity with inflammation: Secondary | ICD-10-CM | POA: Diagnosis not present

## 2014-05-19 DIAGNOSIS — I872 Venous insufficiency (chronic) (peripheral): Secondary | ICD-10-CM

## 2014-05-19 MED ORDER — FUROSEMIDE 40 MG PO TABS: 40.0000 mg | ORAL_TABLET | Freq: Two times a day (BID) | ORAL | Status: DC

## 2014-05-19 MED ORDER — SULFAMETHOXAZOLE-TRIMETHOPRIM 800-160 MG PO TABS: 1.0000 | ORAL_TABLET | Freq: Two times a day (BID) | ORAL | Status: DC

## 2014-05-19 NOTE — Progress Notes (Signed)
  Subjective:    CC: Followup  HPI: Venous stasis dermatitis: 100% better per patient after one month of unna boots. We did do an initial course of doxycycline, he is now wearing TED hose. Symptoms are mild, improving. Still with significant edema.  Past medical history, Surgical history, Family history not pertinant except as noted below, Social history, Allergies, and medications have been entered into the medical record, reviewed, and no changes needed.   Review of Systems: No fevers, chills, night sweats, weight loss, chest pain, or shortness of breath.   Objective:    General: Well Developed, well nourished, and in no acute distress.  Neuro: Alert and oriented x3, extra-ocular muscles intact, sensation grossly intact.  HEENT: Normocephalic, atraumatic, pupils equal round reactive to light, neck supple, no masses, no lymphadenopathy, thyroid nonpalpable.  Skin: Warm and dry, no rashes. Cardiac: Regular rate and rhythm, no murmurs rubs or gallops, no lower extremity edema.  Respiratory: Clear to auscultation bilaterally. Not using accessory muscles, speaking in full sentences. Right leg: Greatly improved, still with 2+ pitting edema, only mild erythema in a venous stasis type distribution.  Impression and Recommendations:

## 2014-05-19 NOTE — Assessment & Plan Note (Signed)
100% better per patient after one month of limits. He still has a slight bit of erythema I am going to add an additional 7 days of Septra. Considering the amount of edema I'm also going to add 40 mg of furosemide twice a day for 7 days.

## 2014-06-30 ENCOUNTER — Encounter: Payer: Self-pay | Admitting: Family Medicine

## 2014-06-30 ENCOUNTER — Ambulatory Visit (INDEPENDENT_AMBULATORY_CARE_PROVIDER_SITE_OTHER): Payer: BC Managed Care – PPO | Admitting: Family Medicine

## 2014-06-30 VITALS — BP 123/79 | HR 65 | Ht 72.0 in | Wt 275.0 lb

## 2014-06-30 DIAGNOSIS — K5900 Constipation, unspecified: Secondary | ICD-10-CM | POA: Diagnosis not present

## 2014-06-30 DIAGNOSIS — E119 Type 2 diabetes mellitus without complications: Secondary | ICD-10-CM

## 2014-06-30 DIAGNOSIS — I10 Essential (primary) hypertension: Secondary | ICD-10-CM

## 2014-06-30 DIAGNOSIS — R252 Cramp and spasm: Secondary | ICD-10-CM

## 2014-06-30 LAB — POCT GLYCOSYLATED HEMOGLOBIN (HGB A1C): Hemoglobin A1C: 8.3

## 2014-06-30 MED ORDER — DIAZEPAM 5 MG PO TABS: 2.5000 mg | ORAL_TABLET | Freq: Two times a day (BID) | ORAL | Status: DC | PRN

## 2014-06-30 MED ORDER — DAPAGLIFLOZIN PRO-METFORMIN ER 5-1000 MG PO TB24
1.0000 | ORAL_TABLET | Freq: Two times a day (BID) | ORAL | Status: DC
Start: 2014-06-30 — End: 2014-09-30

## 2014-06-30 NOTE — Progress Notes (Signed)
CC: Antonio Rivera is a 58 y.o. male is here for Follow-up   Subjective: HPI:  Follow-up hypertension: Continues on lisinopril 10 mg daily. No outside blood pressures to report. Denies chest pain shortness of breath orthopnea nor peripheral edema. Denies cough.  Type 2 diabetes: Continues on metformin 1 g twice a day. No hypoglycemic episodes. He's been checking his fasting blood sugars and they've been ranging from 130-190. He's noticed that his fasting blood sugar does not really correlate with carbohydrates ingested the night or day before. For example he gives me situations that actually showing inverse relationship with fasting blood sugar and carbohydrates consumed the day before.Denies polyuria polyphagia polydipsia  Follow-up muscle cramping: Patient states that symptoms are much better after edema in his legs improved. He occasionally takes the diazepam for muscle cramping in the thighs which resolves pain 100% for matter of days. Denies any motor or sensory disturbances other than that described above  Complaints of occasionally missing a day of his daily bowel movement. When this occurs bowel movements are hard to pass and firm. He wants know if there something natural he can take to help with this.  Review Of Systems Outlined In HPI  Past Medical History  Diagnosis Date  . OSA on CPAP 1999  . HNP (herniated nucleus pulposus)     C6--7  . Hyperlipidemia   . Diabetes mellitus     type 2  . Hypertension     Past Surgical History  Procedure Laterality Date  . Ruptured disk surgery  1999    C6--7  . Tonsillectomy  age 58   Family History  Problem Relation Age of Onset  . Cancer Mother     skin  . Cancer Father 44    prostate/died age 68   . Cancer Sister     ovarian cancer  . Cancer Brother     skin cancer    History   Social History  . Marital Status: Single    Spouse Name: N/A    Number of Children: N/A  . Years of Education: N/A   Occupational History  .  Not on file.   Social History Main Topics  . Smoking status: Former Smoker -- 30 years    Types: Pipe  . Smokeless tobacco: Never Used  . Alcohol Use: 0.5 oz/week    1 drink(s) per week     Comment: per week  . Drug Use: No  . Sexual Activity: Not on file   Other Topics Concern  . Not on file   Social History Narrative     Objective: BP 123/79 mmHg  Pulse 65  Ht 6' (1.829 m)  Wt 275 lb (124.739 kg)  BMI 37.29 kg/m2  Vital signs reviewed. General: Alert and Oriented, No Acute Distress HEENT: Pupils equal, round, reactive to light. Conjunctivae clear.  External ears unremarkable.  Moist mucous membranes. Lungs: Clear and comfortable work of breathing, speaking in full sentences without accessory muscle use. Cardiac: Regular rate and rhythm.  Neuro: CN II-XII grossly intact, gait normal. Extremities: No peripheral edema however pigmentation in both lower extremities from recent venous stasis..  Strong peripheral pulses.  Mental Status: No depression, anxiety, nor agitation. Logical though process. Skin: Warm and dry.  Assessment & Plan: Antonio Rivera was seen today for follow-up.  Diagnoses and associated orders for this visit:  Essential hypertension, benign  Type 2 diabetes mellitus without complication - POCT HgB D6L - Dapagliflozin-Metformin HCl ER (XIGDUO XR) 12-998 MG TB24; Take 1  tablet by mouth 2 (two) times daily.  Muscle cramp - diazepam (VALIUM) 5 MG tablet; Take 0.5-1 tablets (2.5-5 mg total) by mouth every 12 (twelve) hours as needed for muscle spasms.  Constipation, unspecified constipation type    Essential hypertension: Controlled continuous Cipro Type 2 diabetes: Uncontrolled with an A1c of 8.3, stop individualmetformin instead switching to xigduo xr which I made sure the patient was aware that this contains metformin. Muscle cramps: Control with as needed diazepam Constipation: Discussed 1-2 heaping teaspoons of psyllium powder to use on a daily basis to  prevent constipation.  Return in about 3 months (around 09/30/2014) for Diabetes Follow Up.

## 2014-07-05 ENCOUNTER — Emergency Department
Admission: EM | Admit: 2014-07-05 | Discharge: 2014-07-05 | Disposition: A | Discharge status: Home / Self Care | Payer: BC Managed Care – PPO | Source: Home / Self Care | Attending: Family Medicine | Admitting: Family Medicine

## 2014-07-05 ENCOUNTER — Encounter: Payer: Self-pay | Admitting: *Deleted

## 2014-07-05 ENCOUNTER — Telehealth: Payer: Self-pay | Admitting: Family Medicine

## 2014-07-05 DIAGNOSIS — W5501XA Bitten by cat, initial encounter: Secondary | ICD-10-CM

## 2014-07-05 DIAGNOSIS — S61234A Puncture wound without foreign body of right ring finger without damage to nail, initial encounter: Secondary | ICD-10-CM

## 2014-07-05 MED ORDER — TETANUS-DIPHTH-ACELL PERTUSSIS 5-2.5-18.5 LF-MCG/0.5 IM SUSP
0.5000 mL | Freq: Once | INTRAMUSCULAR | Status: AC
  Administered 2014-07-05: 0.5 mL via INTRAMUSCULAR

## 2014-07-05 MED ORDER — AMOXICILLIN-POT CLAVULANATE 875-125 MG PO TABS: 1.0000 | ORAL_TABLET | Freq: Two times a day (BID) | ORAL | Status: DC

## 2014-07-05 MED ORDER — DOXYCYCLINE HYCLATE 100 MG PO CAPS: 100.0000 mg | ORAL_CAPSULE | Freq: Two times a day (BID) | ORAL | Status: DC

## 2014-07-05 NOTE — ED Provider Notes (Addendum)
Antonio Rivera is a 58 y.o. male who presents to Urgent Care today for cat bite. Patient was bitten in his right long finger by his cat today. He was at this is putting the cat down when the cat bit him in the finger. The cat is up-to-date with rabies. No fevers or chills vomiting or diarrhea. Patient feels well otherwise. Last tetanus vaccination was in 2009.  Past Medical History  Diagnosis Date  . OSA on CPAP 1999  . HNP (herniated nucleus pulposus)     C6--7  . Hyperlipidemia   . Diabetes mellitus     type 2  . Hypertension    Past Surgical History  Procedure Laterality Date  . Ruptured disk surgery  1999    C6--7  . Tonsillectomy  age 65   History  Substance Use Topics  . Smoking status: Former Smoker -- 30 years    Types: Pipe  . Smokeless tobacco: Never Used  . Alcohol Use: 0.5 oz/week    1 drink(s) per week     Comment: per week   ROS as above Medications: No current facility-administered medications for this encounter.   Current Outpatient Prescriptions  Medication Sig Dispense Refill  . ACCU-CHEK AVIVA PLUS test strip USE AS INSTRUCTED 100 each 1  . AMBULATORY NON FORMULARY MEDICATION Medication Name: CPAP machine, mask, supplies Dx: OSA 1 Units 0  . amoxicillin-clavulanate (AUGMENTIN) 875-125 MG per tablet Take 1 tablet by mouth every 12 (twelve) hours. 14 tablet 0  . aspirin 81 MG EC tablet Take 81 mg by mouth as needed.      . cetirizine (ZYRTEC) 10 MG tablet Take 1 tablet (10 mg total) by mouth daily. 60 tablet 0  . Dapagliflozin-Metformin HCl ER (XIGDUO XR) 12-998 MG TB24 Take 1 tablet by mouth 2 (two) times daily. 60 tablet 5  . diazepam (VALIUM) 5 MG tablet Take 0.5-1 tablets (2.5-5 mg total) by mouth every 12 (twelve) hours as needed for muscle spasms. 20 tablet 0  . doxycycline (VIBRAMYCIN) 100 MG capsule Take 1 capsule (100 mg total) by mouth 2 (two) times daily. 14 capsule 0  . Fish Oil OIL by Does not apply route.    Marland Kitchen lisinopril (PRINIVIL,ZESTRIL) 10  MG tablet Take 1 tablet (10 mg total) by mouth daily. 90 tablet 3  . Multiple Vitamins-Minerals (MULTIVITAMIN PO) Take 1 tablet by mouth every morning.    Marland Kitchen POTASSIUM PO Take by mouth.    . Red Yeast Rice 600 MG CAPS 2 caps every evening 2 capsule 1   Allergies  Allergen Reactions  . Oysters [Shellfish Allergy] Nausea And Vomiting  . Prednisone Anxiety     Exam:  BP 119/78 mmHg  Pulse 83  Temp(Src) 98.3 F (36.8 C) (Oral)  Ht 6' (1.829 m)  Wt 267 lb (121.11 kg)  BMI 36.20 kg/m2  SpO2 97% Gen: Well NAD Right hand: Long finger. One small puncture wound at the radial nail border.   No results found for this or any previous visit (from the past 24 hour(s)). No results found.  Assessment and Plan: 58 y.o. male with cat bite. Treatment with empiric Augmentin and doxycycline. Follow-up as needed.   Patient given a tetanus vaccination  Discussed warning signs or symptoms. Please see discharge instructions. Patient expresses understanding.     Gregor Hams, MD 07/05/14 [place] Curties Conigliaro, MD 07/05/14 [id number]  Gregor Hams, MD 07/05/14 1249

## 2014-07-05 NOTE — Discharge Instructions (Signed)
Thank you for coming in today. Take the Augmentin and doxycycline twice daily for one week Come back as needed Go to the emergency room if your finger gets very bad   Animal Bite An animal bite can result in a scratch on the skin, deep open cut, puncture of the skin, crush injury, or tearing away of the skin or a body part. Dogs are responsible for most animal bites. Children are bitten more often than adults. An animal bite can range from very mild to more serious. A small bite from your house pet is no cause for alarm. However, some animal bites can become infected or injure a bone or other tissue. You must seek medical care if:  The skin is broken and bleeding does not slow down or stop after 15 minutes.  The puncture is deep and difficult to clean (such as a cat bite).  Pain, warmth, redness, or pus develops around the wound.  The bite is from a stray animal or rodent. There may be a risk of rabies infection.  The bite is from a snake, raccoon, skunk, fox, coyote, or bat. There may be a risk of rabies infection.  The person bitten has a chronic illness such as diabetes, liver disease, or cancer, or the person takes medicine that lowers the immune system.  There is concern about the location and severity of the bite. It is important to clean and protect an animal bite wound right away to prevent infection. Follow these steps:  Clean the wound with plenty of water and soap.  Apply an antibiotic cream.  Apply gentle pressure over the wound with a clean towel or gauze to slow or stop bleeding.  Elevate the affected area above the heart to help stop any bleeding.  Seek medical care. Getting medical care within 8 hours of the animal bite leads to the best possible outcome. DIAGNOSIS  Your caregiver will most likely:  Take a detailed history of the animal and the bite injury.  Perform a wound exam.  Take your medical history. Blood tests or X-rays may be performed. Sometimes,  infected bite wounds are cultured and sent to a lab to identify the infectious bacteria.  TREATMENT  Medical treatment will depend on the location and type of animal bite as well as the patient's medical history. Treatment may include:  Wound care, such as cleaning and flushing the wound with saline solution, bandaging, and elevating the affected area.  Antibiotics.  Tetanus immunization.  Rabies immunization.  Leaving the wound open to heal. This is often done with animal bites, due to the high risk of infection. However, in certain cases, wound closure with stitches, wound adhesive, skin adhesive strips, or staples may be used. Infected bites that are left untreated may require intravenous (IV) antibiotics and surgical treatment in the hospital. Gordon  Follow your caregiver's instructions for wound care.  Take all medicines as directed.  If your caregiver prescribes antibiotics, take them as directed. Finish them even if you start to feel better.  Follow up with your caregiver for further exams or immunizations as directed. You may need a tetanus shot if:  You cannot remember when you had your last tetanus shot.  You have never had a tetanus shot.  The injury broke your skin. If you get a tetanus shot, your arm may swell, get red, and feel warm to the touch. This is common and not a problem. If you need a tetanus shot and you choose not  to have one, there is a rare chance of getting tetanus. Sickness from tetanus can be serious. SEEK MEDICAL CARE IF:  You notice warmth, redness, soreness, swelling, pus discharge, or a bad smell coming from the wound.  You have a red line on the skin coming from the wound.  You have a fever, chills, or a general ill feeling.  You have nausea or vomiting.  You have continued or worsening pain.  You have trouble moving the injured part.  You have other questions or concerns. MAKE SURE YOU:  Understand these  instructions.  Will watch your condition.  Will get help right away if you are not doing well or get worse. Document Released: 04/11/2011 Document Revised: 10/16/2011 Document Reviewed: 04/11/2011 [facility] Patient Information 2015 [place], [place]. This information is not intended to replace advice given to you by your health care provider. Make sure you discuss any questions you have with your health care provider.

## 2014-07-05 NOTE — ED Notes (Signed)
Pt was bit by his cat at the vet today when the cat was being put to sleep.  Cat was up to date on immunizations.  No pain.  Bite is along the nail of finger on R hand.

## 2014-07-13 ENCOUNTER — Other Ambulatory Visit: Payer: Self-pay | Admitting: Family Medicine

## 2014-08-05 ENCOUNTER — Telehealth: Payer: Self-pay | Admitting: Family Medicine

## 2014-08-05 MED ORDER — AMBULATORY NON FORMULARY MEDICATION: Status: DC

## 2014-08-05 NOTE — Telephone Encounter (Signed)
Spoke to patient advised him that new Rx was faxed to [facility]

## 2014-08-05 NOTE — Telephone Encounter (Signed)
Antonio Rivera, Will you please let patient know that [employer] will no longer cover AccuCheck Aviva test strips and instead will now cover Onetouch Ultra Test strips.  I'll print out a RX for this (in your inbox)

## 2014-08-08 NOTE — ED Notes (Signed)
Note opened in error  Gregor Hams, MD 08/08/14 [phone]

## 2014-08-14 ENCOUNTER — Telehealth: Payer: Self-pay | Admitting: *Deleted

## 2014-08-14 MED ORDER — AMBULATORY NON FORMULARY MEDICATION: Status: DC

## 2014-08-14 NOTE — Telephone Encounter (Signed)
Pt left a message that the test strips won't fit his glucometer. [employer] is covers One touch ultra apparently. (see previous phone note)

## 2014-08-19 ENCOUNTER — Emergency Department
Admission: EM | Admit: 2014-08-19 | Discharge: 2014-08-19 | Disposition: A | Discharge status: Home / Self Care | Payer: BLUE CROSS/BLUE SHIELD | Source: Home / Self Care | Attending: Family Medicine | Admitting: Family Medicine

## 2014-08-19 ENCOUNTER — Encounter: Payer: Self-pay | Admitting: *Deleted

## 2014-08-19 DIAGNOSIS — K1120 Sialoadenitis, unspecified: Secondary | ICD-10-CM

## 2014-08-19 MED ORDER — AMOXICILLIN 875 MG PO TABS: 875.0000 mg | ORAL_TABLET | Freq: Two times a day (BID) | ORAL | Status: DC

## 2014-08-19 NOTE — ED Provider Notes (Signed)
CSN: [id number]     Arrival date & time 08/19/14  0845 History   First MD Initiated Contact with Patient 08/19/14 1002     Chief Complaint  Patient presents with  . Mass      HPI Comments: Patient reports that he had a mild sore throat yesterday and soreness at the right lateral edge of his tongue.  He also developed a painful lump under his right jaw last night.  No fevers, chills, and sweats   The history is provided by the patient.    Past Medical History  Diagnosis Date  . OSA on CPAP 1999  . HNP (herniated nucleus pulposus)     C6--7  . Hyperlipidemia   . Diabetes mellitus     type 2  . Hypertension    Past Surgical History  Procedure Laterality Date  . Ruptured disk surgery  1999    C6--7  . Tonsillectomy  age 70   Family History  Problem Relation Age of Onset  . Cancer Mother     skin  . Cancer Father 35    prostate/died age 3   . Cancer Sister     ovarian cancer  . Cancer Brother     skin cancer   History  Substance Use Topics  . Smoking status: Former Smoker -- 30 years    Types: Pipe  . Smokeless tobacco: Never Used  . Alcohol Use: 0.5 oz/week    1 drink(s) per week     Comment: per week    Review of Systems  Constitutional: Negative for fever, chills, diaphoresis, activity change, appetite change and fatigue.  HENT: Positive for sore throat. Negative for congestion, dental problem, drooling, ear discharge, ear pain, facial swelling, mouth sores, postnasal drip, rhinorrhea and trouble swallowing.        Tender nodule beneath right jaw  All other systems reviewed and are negative.   Allergies  Oysters and Prednisone  Home Medications   Prior to Admission medications   Medication Sig Start Date End Date Taking? Authorizing Provider  AMBULATORY NON FORMULARY MEDICATION Medication Name: CPAP machine, mask, supplies Dx: OSA 02/24/11   Collene Leyden Bowen, DO  AMBULATORY NON FORMULARY MEDICATION Onetouch Ultra Test Strips: use to check blood sugar twice a  day as needed.  Dx: Type 2 Diabetes 08/05/14   Marcial Pacas, DO  AMBULATORY NON FORMULARY MEDICATION One Touch Ultra glucometer To use as directed Dx: E11.9 Type 2 Diabetes 08/14/14   Marcial Pacas, DO  amoxicillin (AMOXIL) 875 MG tablet Take 1 tablet (875 mg total) by mouth 2 (two) times daily. 08/19/14   Kandra Nicolas, MD  aspirin 81 MG EC tablet Take 81 mg by mouth as needed.      Historical Provider, MD  cetirizine (ZYRTEC) 10 MG tablet Take 1 tablet (10 mg total) by mouth daily. 10/06/13   Marcial Pacas, DO  Dapagliflozin-Metformin HCl ER (XIGDUO XR) 12-998 MG TB24 Take 1 tablet by mouth 2 (two) times daily. 06/30/14   Sean Hommel, DO  diazepam (VALIUM) 5 MG tablet Take 0.5-1 tablets (2.5-5 mg total) by mouth every 12 (twelve) hours as needed for muscle spasms. 06/30/14   Marcial Pacas, DO  doxycycline (VIBRAMYCIN) 100 MG capsule Take 1 capsule (100 mg total) by mouth 2 (two) times daily. 07/05/14   Gregor Hams, MD  Fish Oil OIL by Does not apply route.    Historical Provider, MD  lisinopril (PRINIVIL,ZESTRIL) 10 MG tablet Take 1 tablet (10 mg total) by  mouth daily. 01/06/14   Marcial Pacas, DO  Multiple Vitamins-Minerals (MULTIVITAMIN PO) Take 1 tablet by mouth every morning.    Historical Provider, MD  POTASSIUM PO Take by mouth.    Historical Provider, MD  Red Yeast Rice 600 MG CAPS 2 caps every evening 04/02/13   Sean Hommel, DO   BP 108/71 mmHg  Pulse 90  Temp(Src) 98.4 F (36.9 C) (Oral)  Resp 18  Ht 6' (1.829 m)  Wt 262 lb (118.842 kg)  BMI 35.53 kg/m2  SpO2 97% Physical Exam Nursing notes and Vital Signs reviewed. Appearance:  Patient appears stated age, and in no acute distress.  Patient is obese (BMI 35.3) Eyes:  Pupils are equal, round, and reactive to light and accomodation.  Extraocular movement is intact.  Conjunctivae are not inflamed  Ears:  Canals normal.  Tympanic membranes normal.  Nose:  Normal turbinates.   Mouth:  No swelling or tenderness to probe.  Pharynx:   Normal Neck:  Supple.  No cervical adenopathy.  There is tenderness to palpation over right submandibular gland. Lungs:  Clear to auscultation.  Breath sounds are equal.  Heart:  Regular rate and rhythm without murmurs, rubs, or gallops.  Skin:  No rash present.   ED Course  Procedures       MDM   1. Submandibular sialoadenitis    Begin amoxicillin. Apply warm compress several times daily.  May take Ibuprofen 200mg , 4 tabs every 8 hours with food, or Aleve two tabs every 12 hours with food.  Suck on tart sour candies such as lemon drops.  Increase fluid intake. If symptoms become significantly worse during the night or over the weekend, proceed to the local emergency room Followup with ENT if not improved in five days.    Kandra Nicolas, MD 08/24/14 [phone]

## 2014-08-19 NOTE — Discharge Instructions (Signed)
Apply warm compress several times daily.  May take Ibuprofen 200mg , 4 tabs every 8 hours with food, or Aleve two tabs every 12 hours with food.  Suck on tart sour candies such as lemon drops.  Increase fluid intake. If symptoms become significantly worse during the night or over the weekend, proceed to the local emergency room.    Sialadenitis Sialadenitis is an inflammation (soreness) of the salivary glands. The parotid is the main salivary gland. It lies behind the angle of the jaw below the ear. The saliva produced comes out of a tiny opening (duct) inside the cheek on either side. This is usually at the level of the upper back teeth. If it is swollen, the ear is pushed up and out. This helps tell this condition apart from a simple lymph gland infection (swollen glands) in the same area. Mumps has mostly disappeared since the start of immunization against mumps. Now the most common cause of parotitis is germ (bacterial) infection or inflammation of the lymphatics (the lymph channels). The other major salivary gland is located in the floor of the mouth. Smaller salivary glands are located in the mouth. This includes the:  Lips.  Lining of the mouth.  Pharynx.  Hard palate (front part of the roof of the mouth). The salivary glands do many things, including:  Lubrication.  Breaking down food.  Production of hormones and antibodies (to protect against germs which may cause illness).  Help with the sense of taste. ACUTE BACTERIAL SIALADENITIS This is a sudden inflammatory response to bacterial infection. This causes redness, pain, swelling and tenderness over the infected gland. In the past, it was common in dehydrated and debilitated patients often following an operation. It is now more commonly seen:  After radiotherapy.  In patients with poor immune systems. Treatment is:  The correction of fluid balance (rehydration).  Medicine that kill germs (antibiotics).  Pain  relief. CHRONIC RECURRENT SIALADENITIS This refers to repeated episodes of discomfort and swelling of one of the salivary glands. It often occurs after eating. Chronic sialadenitis is usually less painful. It is associated with recurrent enlargement of a salivary gland, often following meals, and typically with an absence of redness. The chronic form of the disease often is associated with conditions linked to decreased salivary flow, rather than dehydration (loss of body fluids). These conditions include:  A stone, or concretion, formed in the gallbladder, kidneys, or other parts of the body (calculi).  Salivary stasis.  A change in the fluid and electrolyte (the salts in your body fluids) makeup of the gland. It is treated with:  Gland massage.  Methods to stimulate the flow of saliva, (for example, lemon juice).  Antibiotics if required. Surgery to remove the gland is possible, but its benefits need to be balanced against risks.  VIRAL SIALADENITIS Several viruses infect the salivary glands. Some of these include the mumps virus that commonly infects the parotid gland. Other viruses causing problems are:  The HIV virus.  Herpes.  Some of the influenza ("flu") viruses. RECURRENT SIALADENITIS IN CHILDREN This condition is thought to be due to swelling or ballooning of the ducts. It results in the same symptoms as acute bacterial parotitis. It is usually caused by germs (bacteria). It is often treated using penicillin. It may get well without treatment. Surgery is usually not required. TUBERCULOUS SIALADENITIS The salivary glands may become infected with the same bacteria causing tuberculosis ("TB"). Treatment is with anti-tuberculous antibiotic therapy. OTHER UNCOMMON CAUSES OF SIALADENITIS   Sjogren's  syndrome is a condition in which arthritis is associated with a decrease in activity of the glands of the body that produce saliva and tears. The diagnosis is made with blood tests or  by examination of a piece of tissue from the inside of the lip. Some people with this condition are bothered by:  A dry mouth.  Intermittent salivary gland enlargement.  Atypical mycobacteria is a germ similar to tuberculosis. It often infects children. It is often resistant to antibiotic treatment. It may require surgical treatment to remove the infected salivary gland.  Actinomycosis is an infection of the parotid gland that may also involve the overlying skin. The diagnosis is made by detecting granules of sulphur produced by the bacteria on microscopic examination. Treatment is a prolonged course of penicillin for up to one year.  Nutritional causes include vitamin deficiencies and bulimia.  Diabetes and problems with your thyroid.  Obesity, cirrhosis, and malabsorption are some metabolic causes. HOME CARE INSTRUCTIONS   Apply ice bags every 2 hours for 15-20 minutes, while awake, to the sore gland for 24 hours, then as directed by your caregiver. Place the ice in a plastic bag with a towel around it to prevent frostbite to the skin.  Only take over-the-counter or prescription medicines for pain, discomfort, or fever as directed by your caregiver. SEEK IMMEDIATE MEDICAL CARE IF:   There is increased pain or swelling in your gland that is not controlled with medicine.  An oral temperature above 102 F (38.9 C) develops, not controlled by medicine.  You develop difficulty opening your mouth, swallowing, or speaking. Document Released: 01/13/2002 Document Revised: 10/16/2011 Document Reviewed: 03/09/2008 [facility] Patient Information 2015 [place], [place]. This information is not intended to replace advice given to you by your health care provider. Make sure you discuss any questions you have with your health care provider.

## 2014-08-19 NOTE — ED Notes (Signed)
Pt c/o painful knot under his RT jaw x last night. Denies fever.

## 2014-09-30 ENCOUNTER — Ambulatory Visit (INDEPENDENT_AMBULATORY_CARE_PROVIDER_SITE_OTHER): Payer: BLUE CROSS/BLUE SHIELD | Admitting: Family Medicine

## 2014-09-30 ENCOUNTER — Encounter: Payer: Self-pay | Admitting: Family Medicine

## 2014-09-30 VITALS — BP 118/77 | HR 77 | Ht 72.0 in | Wt 260.0 lb

## 2014-09-30 DIAGNOSIS — K5900 Constipation, unspecified: Secondary | ICD-10-CM | POA: Insufficient documentation

## 2014-09-30 DIAGNOSIS — I10 Essential (primary) hypertension: Secondary | ICD-10-CM | POA: Diagnosis not present

## 2014-09-30 DIAGNOSIS — E119 Type 2 diabetes mellitus without complications: Secondary | ICD-10-CM

## 2014-09-30 LAB — POCT UA - MICROALBUMIN

## 2014-09-30 LAB — POCT GLYCOSYLATED HEMOGLOBIN (HGB A1C): HEMOGLOBIN A1C: 7.6

## 2014-09-30 MED ORDER — DAPAGLIFLOZIN PRO-METFORMIN ER 5-1000 MG PO TB24: 1.0000 | ORAL_TABLET | Freq: Two times a day (BID) | ORAL | Status: DC

## 2014-09-30 MED ORDER — LISINOPRIL 10 MG PO TABS: 10.0000 mg | ORAL_TABLET | Freq: Every day | ORAL | Status: DC

## 2014-09-30 NOTE — Progress Notes (Signed)
CC: Antonio Rivera is a 59 y.o. male is here for Follow-up   Subjective: HPI:  Follow-up type 2 diabetes: Since starting Xigduo XR isurinating more frequently but never having to wake more than once a night to urinate. He denies any dysuria nor urinary urgency. Denies any other side effects the above medication. No outside blood sugars report. He's been trying to cut back on sugars and his diet but admits that he still eats cookies for breakfast. No formal exercise routine.  Follow-up essential hypertension: Continues on lisinopril on a daily basis no outside blood pressures to report denies chest pain shortness of breath orthopnea nor peripheral edema  Follow constipation: Since starting psyllium powder he's taking 2-3 doses a day and has no longer had any difficulty with having a bowel movement he has 1-2 pain was well formed bowel movements on a daily basis without diarrhea. No abdominal pain   Review Of Systems Outlined In HPI  Past Medical History  Diagnosis Date  . OSA on CPAP 1999  . HNP (herniated nucleus pulposus)     C6--7  . Hyperlipidemia   . Diabetes mellitus     type 2  . Hypertension     Past Surgical History  Procedure Laterality Date  . Ruptured disk surgery  1999    C6--7  . Tonsillectomy  age 77   Family History  Problem Relation Age of Onset  . Cancer Mother     skin  . Cancer Father 35    prostate/died age 77   . Cancer Sister     ovarian cancer  . Cancer Brother     skin cancer    History   Social History  . Marital Status: Single    Spouse Name: N/A  . Number of Children: N/A  . Years of Education: N/A   Occupational History  . Not on file.   Social History Main Topics  . Smoking status: Former Smoker -- 30 years    Types: Pipe  . Smokeless tobacco: Never Used  . Alcohol Use: 0.5 oz/week    1 drink(s) per week     Comment: per week  . Drug Use: No  . Sexual Activity: Not on file   Other Topics Concern  . Not on file   Social  History Narrative     Objective: BP 118/77 mmHg  Pulse 77  Ht 6' (1.829 m)  Wt 260 lb (117.935 kg)  BMI 35.25 kg/m2  General: Alert and Oriented, No Acute Distress HEENT: Pupils equal, round, reactive to light. Conjunctivae clear.  Moist mucous membranes Lungs: Clear to auscultation bilaterally, no wheezing/ronchi/rales.  Comfortable work of breathing. Good air movement. Cardiac: Regular rate and rhythm. Normal S1/S2.  No murmurs, rubs, nor gallops.   Extremities: No peripheral edema.  Strong peripheral pulses.  Mental Status: No depression, anxiety, nor agitation. Skin: Warm and dry.  Assessment & Plan: Antonio Rivera was seen today for follow-up.  Diagnoses and all orders for this visit:  Diabetes mellitus without complication Orders: -     POCT HgB A1C  Essential hypertension, benign Orders: -     lisinopril (PRINIVIL,ZESTRIL) 10 MG tablet; Take 1 tablet (10 mg total) by mouth daily.  Type 2 diabetes mellitus without complication Orders: -     Dapagliflozin-Metformin HCl ER (XIGDUO XR) 12-998 MG TB24; Take 1 tablet by mouth 2 (two) times daily.  Constipation, unspecified constipation type   Type 2 diabetes: Uncontrolled, continue current anti-hyperglycemics stress the importance of cutting back  on sugar and that eating cookies for breakfast is a major factor in why his A1c is still just barely above 7. No changes to the medication since this dietary measure is the easiest path to reaching his goal A1c Essential hypertension: Controlled continue lisinopril Constipation: Controlled continue daily psyllium powder  Return in about 3 months (around 12/29/2014) for DM.

## 2014-10-01 NOTE — Addendum Note (Signed)
Addended by: Isaias Cowman C on: 10/01/2014 11:10 AM   Modules accepted: Orders

## 2014-12-29 ENCOUNTER — Encounter: Payer: Self-pay | Admitting: Family Medicine

## 2014-12-29 ENCOUNTER — Ambulatory Visit (INDEPENDENT_AMBULATORY_CARE_PROVIDER_SITE_OTHER): Payer: BLUE CROSS/BLUE SHIELD | Admitting: Family Medicine

## 2014-12-29 VITALS — BP 107/71 | HR 62 | Wt 259.0 lb

## 2014-12-29 DIAGNOSIS — D239 Other benign neoplasm of skin, unspecified: Secondary | ICD-10-CM

## 2014-12-29 DIAGNOSIS — I10 Essential (primary) hypertension: Secondary | ICD-10-CM | POA: Diagnosis not present

## 2014-12-29 DIAGNOSIS — E119 Type 2 diabetes mellitus without complications: Secondary | ICD-10-CM | POA: Diagnosis not present

## 2014-12-29 DIAGNOSIS — R252 Cramp and spasm: Secondary | ICD-10-CM

## 2014-12-29 LAB — POCT GLYCOSYLATED HEMOGLOBIN (HGB A1C): Hemoglobin A1C: 7

## 2014-12-29 NOTE — Progress Notes (Signed)
CC: Antonio Rivera is a 59 y.o. male is here for Follow-up   Subjective: HPI:  Follow-up type 2 diabetes: Continues to take xigduo xr twice a day without any noted side effects. Denies polyuria or polyphagia or polydipsia. No urinary complaints.  Follow-up essential hypertension: Continues to take lisinopril on a daily basis without known side effects. No outside blood pressures to report. No chest pain shortness of breath orthopnea nor peripheral edema  He reports that muscle cramping is a most nonexistent now. He's had one episode since I saw him last and it was localized in the inner thighs. It occurred when he went from a lying to standing position quickly soon after awakening. He was able to stretch it out and resolve the cramping within a few minutes. Denies any exertional cramping of the appendages.  He has a spot on his left ankle which is dark and has been there for decades however over the past 3 months has become slightly raised.   Review Of Systems Outlined In HPI  Past Medical History  Diagnosis Date  . OSA on CPAP 1999  . HNP (herniated nucleus pulposus)     C6--7  . Hyperlipidemia   . Diabetes mellitus     type 2  . Hypertension     Past Surgical History  Procedure Laterality Date  . Ruptured disk surgery  1999    C6--7  . Tonsillectomy  age 69   Family History  Problem Relation Age of Onset  . Cancer Mother     skin  . Cancer Father 37    prostate/died age 53   . Cancer Sister     ovarian cancer  . Cancer Brother     skin cancer    History   Social History  . Marital Status: Single    Spouse Name: N/A  . Number of Children: N/A  . Years of Education: N/A   Occupational History  . Not on file.   Social History Main Topics  . Smoking status: Former Smoker -- 30 years    Types: Pipe  . Smokeless tobacco: Never Used  . Alcohol Use: 0.5 oz/week    1 drink(s) per week     Comment: per week  . Drug Use: No  . Sexual Activity: Not on file    Other Topics Concern  . Not on file   Social History Narrative     Objective: BP 107/71 mmHg  Pulse 62  Wt 259 lb (117.482 kg)  General: Alert and Oriented, No Acute Distress HEENT: Pupils equal, round, reactive to light. Conjunctivae clear.  Moist mucous membranes Lungs: Clear to auscultation bilaterally, no wheezing/ronchi/rales.  Comfortable work of breathing. Good air movement. Cardiac: Regular rate and rhythm. Normal S1/S2.  No murmurs, rubs, nor gallops.   Abdomen: Obese Extremities: No peripheral edema.  Strong peripheral pulses.  Mental Status: No depression, anxiety, nor agitation. Skin: Warm and dry. Circular well-defined single pigmented lesion on the left ankle approximately 5 mm in diameter with a rubber texture.  Assessment & Plan: Antonio Rivera was seen today for follow-up.  Diagnoses and all orders for this visit:  Diabetes mellitus without complication Orders: -     POCT HgB A1C  Essential hypertension, benign  Type 2 diabetes mellitus without complication  Muscle cramp  Dermatofibroma   type 2 diabetes: A1c 7.0, controlled continue, xigduo xr. Essential hypertension: Control continuously from Muscle cramping: Controlled no further intervention Reassurance provided that the dermatofibroma on his left ankle is benign.  Return in about 3 months (around 03/31/2015) for Annual physical in August..

## 2015-03-31 ENCOUNTER — Encounter: Payer: Self-pay | Admitting: Family Medicine

## 2015-03-31 ENCOUNTER — Ambulatory Visit (INDEPENDENT_AMBULATORY_CARE_PROVIDER_SITE_OTHER): Payer: BLUE CROSS/BLUE SHIELD | Admitting: Family Medicine

## 2015-03-31 VITALS — BP 109/74 | HR 68 | Temp 98.1°F | Wt 256.0 lb

## 2015-03-31 DIAGNOSIS — Z23 Encounter for immunization: Secondary | ICD-10-CM | POA: Diagnosis not present

## 2015-03-31 DIAGNOSIS — Z0000 Encounter for general adult medical examination without abnormal findings: Secondary | ICD-10-CM

## 2015-03-31 DIAGNOSIS — E119 Type 2 diabetes mellitus without complications: Secondary | ICD-10-CM | POA: Diagnosis not present

## 2015-03-31 LAB — POCT GLYCOSYLATED HEMOGLOBIN (HGB A1C): Hemoglobin A1C: 7

## 2015-03-31 NOTE — Progress Notes (Signed)
CC: Antonio Rivera is a 59 y.o. male is here for Annual Exam   Subjective: HPI:  Colonoscopy: 2014 repeat 2019 Prostate: Discussed screening risks/beneifts with patient during today's visit, he would like a PSA obtained  Influenza Vaccine: He will receive this today Pneumovax: No current indication Td/Tdap: Up to date from 2016 Zoster: Urged to have this done after he turns 15 this winter  No acute complaints today requesting complete physical exam.  Review of Systems - General ROS: negative for - chills, fever, night sweats, weight gain or weight loss Ophthalmic ROS: negative for - decreased vision Psychological ROS: negative for - anxiety or depression ENT ROS: negative for - hearing change, nasal congestion, tinnitus or allergies Hematological and Lymphatic ROS: negative for - bleeding problems, bruising or swollen lymph nodes Breast ROS: negative Respiratory ROS: no cough, shortness of breath, or wheezing Cardiovascular ROS: no chest pain or dyspnea on exertion Gastrointestinal ROS: no abdominal pain, change in bowel habits, or black or bloody stools Genito-Urinary ROS: negative for - genital discharge, genital ulcers, incontinence or abnormal bleeding from genitals Musculoskeletal ROS: negative for - joint pain or muscle pain Neurological ROS: negative for - headaches or memory loss Dermatological ROS: negative for lumps, mole changes, rash and skin lesion changes  Past Medical History  Diagnosis Date  . OSA on CPAP 1999  . HNP (herniated nucleus pulposus)     C6--7  . Hyperlipidemia   . Diabetes mellitus     type 2  . Hypertension     Past Surgical History  Procedure Laterality Date  . Ruptured disk surgery  1999    C6--7  . Tonsillectomy  age 28   Family History  Problem Relation Age of Onset  . Cancer Mother     skin  . Cancer Father 40    prostate/died age 45   . Cancer Sister     ovarian cancer  . Cancer Brother     skin cancer    Social History    Social History  . Marital Status: Single    Spouse Name: N/A  . Number of Children: N/A  . Years of Education: N/A   Occupational History  . Not on file.   Social History Main Topics  . Smoking status: Former Smoker -- 30 years    Types: Pipe  . Smokeless tobacco: Never Used  . Alcohol Use: 0.5 oz/week    1 drink(s) per week     Comment: per week  . Drug Use: No  . Sexual Activity: Not on file   Other Topics Concern  . Not on file   Social History Narrative     Objective: BP 109/74 mmHg  Pulse 68  Temp(Src) 98.1 F (36.7 C) (Oral)  Wt 256 lb (116.121 kg)  SpO2 95%  General: No Acute Distress HEENT: Atraumatic, normocephalic, conjunctivae normal without scleral icterus.  No nasal discharge, hearing grossly intact, TMs with good landmarks bilaterally with no middle ear abnormalities, posterior pharynx clear without oral lesions. Neck: Supple, trachea midline, no cervical nor supraclavicular adenopathy. Pulmonary: Clear to auscultation bilaterally without wheezing, rhonchi, nor rales. Cardiac: Regular rate and rhythm.  No murmurs, rubs, nor gallops. No peripheral edema.  2+ peripheral pulses bilaterally. Abdomen: Bowel sounds normal.  No masses.  Non-tender without rebound.  Negative Murphy's sign. MSK: Grossly intact, no signs of weakness.  Full strength throughout upper and lower extremities.  Full ROM in upper and lower extremities.  No midline spinal tenderness. Neuro: Gait unremarkable,  CN II-XII grossly intact.  C5-C6 Reflex 2/4 Bilaterally, L4 Reflex 2/4 Bilaterally.  Cerebellar function intact. Skin: No rashes. Psych: Alert and oriented to person/place/time.  Thought process normal. No anxiety/depression.  Assessment & Plan: [name] was seen today for annual exam.  Diagnoses and all orders for this visit:  Annual physical exam -     CBC -     COMPLETE METABOLIC PANEL WITH GFR -     Lipid panel -     PSA  Type 2 diabetes mellitus without complication -      POCT HgB A1C   Healthy lifestyle interventions including but not limited to regular exercise, a healthy low fat diet, moderation of salt intake, the dangers of tobacco/alcohol/recreational drug use, nutrition supplementation, and accident avoidance were discussed with the patient and a handout was provided for future reference.  A1c is 7.0, diabetes is currently controlled continue xigduo.   Return in about 3 months (around 07/01/2015) for Blood sugar follow up.

## 2015-04-01 LAB — COMPLETE METABOLIC PANEL WITH GFR
ALBUMIN: 4.5 g/dL (ref 3.6–5.1)
ALT: 22 U/L (ref 9–46)
AST: 18 U/L (ref 10–35)
Alkaline Phosphatase: 57 U/L (ref 40–115)
BILIRUBIN TOTAL: 0.7 mg/dL (ref 0.2–1.2)
BUN: 15 mg/dL (ref 7–25)
CALCIUM: 9.2 mg/dL (ref 8.6–10.3)
CO2: 27 mmol/L (ref 20–31)
CREATININE: 0.86 mg/dL (ref 0.70–1.33)
Chloride: 99 mmol/L (ref 98–110)
GFR, Est Non African American: >89 mL/min (ref >=60)
Glucose, Bld: 167 mg/dL — ABNORMAL HIGH (ref 65–99)
Potassium: 4.8 mmol/L (ref 3.5–5.3)
Sodium: 136 mmol/L (ref 135–146)
TOTAL PROTEIN: 7 g/dL (ref 6.1–8.1)

## 2015-04-01 LAB — CBC
HCT: 50.9 % (ref 39.0–52.0)
HEMOGLOBIN: 17 g/dL (ref 13.0–17.0)
MCH: 31 pg (ref 26.0–34.0)
MCHC: 33.4 g/dL (ref 30.0–36.0)
MCV: 92.7 fL (ref 78.0–100.0)
MPV: 10.1 fL (ref 8.6–12.4)
Platelets: 257 10*3/uL (ref 150–400)
RBC: 5.49 MIL/uL (ref 4.22–5.81)
RDW: 13.9 % (ref 11.5–15.5)
WBC: 7 10*3/uL (ref 4.0–10.5)

## 2015-04-01 LAB — LIPID PANEL
CHOLESTEROL: 169 mg/dL (ref 125–200)
HDL: 49 mg/dL (ref >=40)
LDL Cholesterol: 87 mg/dL (ref <130)
Total CHOL/HDL Ratio: 3.4 Ratio (ref <=5.0)
Triglycerides: 166 mg/dL — ABNORMAL HIGH (ref <150)
VLDL: 33 mg/dL — ABNORMAL HIGH (ref <30)

## 2015-04-01 LAB — PSA: PSA: 0.93 ng/mL (ref <=4.00)

## 2015-07-05 ENCOUNTER — Ambulatory Visit (INDEPENDENT_AMBULATORY_CARE_PROVIDER_SITE_OTHER): Payer: BLUE CROSS/BLUE SHIELD | Admitting: Family Medicine

## 2015-07-05 ENCOUNTER — Encounter: Payer: Self-pay | Admitting: Family Medicine

## 2015-07-05 VITALS — BP 106/67 | HR 64 | Wt 254.0 lb

## 2015-07-05 DIAGNOSIS — E1169 Type 2 diabetes mellitus with other specified complication: Secondary | ICD-10-CM | POA: Diagnosis not present

## 2015-07-05 DIAGNOSIS — I10 Essential (primary) hypertension: Secondary | ICD-10-CM | POA: Diagnosis not present

## 2015-07-05 DIAGNOSIS — E119 Type 2 diabetes mellitus without complications: Secondary | ICD-10-CM | POA: Diagnosis not present

## 2015-07-05 DIAGNOSIS — S46002A Unspecified injury of muscle(s) and tendon(s) of the rotator cuff of left shoulder, initial encounter: Secondary | ICD-10-CM

## 2015-07-05 LAB — POCT GLYCOSYLATED HEMOGLOBIN (HGB A1C): HEMOGLOBIN A1C: 7.3

## 2015-07-05 MED ORDER — LISINOPRIL 10 MG PO TABS: 10.0000 mg | ORAL_TABLET | Freq: Every day | ORAL | Status: DC

## 2015-07-05 NOTE — Progress Notes (Signed)
CC: Antonio Rivera is a 59 y.o. male is here for Hyperglycemia   Subjective: HPI:    Follow-up type 2 diabetes: Currently taking xigduo XR twice a day without any known side effects. No outside blood sugars report. He's been able to lose 3 pounds since I saw him last with daily exercise. Denies polyuria or polyphagia or polydipsia  Follow-up hypertension: Currently taking lisinopril 10 mg daily. Blood pressure to report. He denies chest pain shortness of breath orthopnea or peripheral edema:  Complains of left shoulder pain has been present for the last 2 or 3 weeks. It's only present if his on his outstretched beside and he does a twisting movement of the humerus. It's localized deep in the shoulder and nonradiating. Denies any recent trauma or overexertion. Nothing makes symptoms better or worse  Review Of Systems Outlined In HPI  Past Medical History  Diagnosis Date  . OSA on CPAP 1999  . HNP (herniated nucleus pulposus)     C6--7  . Hyperlipidemia   . Diabetes mellitus     type 2  . Hypertension     Past Surgical History  Procedure Laterality Date  . Ruptured disk surgery  1999    C6--7  . Tonsillectomy  age 68   Family History  Problem Relation Age of Onset  . Cancer Mother     skin  . Cancer Father 45    prostate/died age 19   . Cancer Sister     ovarian cancer  . Cancer Brother     skin cancer    Social History   Social History  . Marital Status: Single    Spouse Name: N/A  . Number of Children: N/A  . Years of Education: N/A   Occupational History  . Not on file.   Social History Main Topics  . Smoking status: Former Smoker -- 30 years    Types: Pipe  . Smokeless tobacco: Never Used  . Alcohol Use: 0.5 oz/week    1 drink(s) per week     Comment: per week  . Drug Use: No  . Sexual Activity: Not on file   Other Topics Concern  . Not on file   Social History Narrative     Objective: BP 106/67 mmHg  Pulse 64  Wt 254 lb (115.214  kg)  General: Alert and Oriented, No Acute Distress HEENT: Pupils equal, round, reactive to light. Conjunctivae clear.  Moist mucous membranes pharynx unremarkable Lungs: Clear to auscultation bilaterally, no wheezing/ronchi/rales.  Comfortable work of breathing. Good air movement. Cardiac: Regular rate and rhythm. Normal S1/S2.  No murmurs, rubs, nor gallops.   Left shoulder exam reveals full range of motion and strength in all planes of motion and with individual rotator cuff testing. No overlying redness warmth or swelling.  Neer's test negative.  Hawkins test positive, empty can positive. Extremities: No peripheral edema.  Strong peripheral pulses.  Mental Status: No depression, anxiety, nor agitation. Skin: Warm and dry.  Assessment & Plan: [name] was seen today for hyperglycemia.  Diagnoses and all orders for this visit:  Type 2 diabetes mellitus with other specified complication ([place]) -     POCT HgB A1C  Essential hypertension, benign -     lisinopril (PRINIVIL,ZESTRIL) 10 MG tablet; Take 1 tablet (10 mg total) by mouth daily.  Type 2 diabetes mellitus without complication, without long-term current use of insulin (HCC)  Injury of left rotator cuff, initial encounter   Type 2 diabetes: A1c is 7.3, uncontrolled  chronic condition. Continue on twice a day xigduo xr,we were able to identify carbohydrates that he believes he can cut out of his daily diet easily. Essential hypertension: Controlled continue lisinopril Left rotator cuff  Injury, given a home rehabilitation stretching and exercise plan to start on a daily basis for the next 2-3 weeks.   Return in about 3 months (around 10/05/2015).

## 2015-09-24 ENCOUNTER — Other Ambulatory Visit: Payer: Self-pay | Admitting: Family Medicine

## 2015-10-06 ENCOUNTER — Encounter: Payer: Self-pay | Admitting: Family Medicine

## 2015-10-06 ENCOUNTER — Ambulatory Visit (INDEPENDENT_AMBULATORY_CARE_PROVIDER_SITE_OTHER): Payer: BLUE CROSS/BLUE SHIELD | Admitting: Family Medicine

## 2015-10-06 VITALS — BP 107/75 | HR 77 | Temp 98.1°F | Resp 18 | Wt 255.0 lb

## 2015-10-06 DIAGNOSIS — I10 Essential (primary) hypertension: Secondary | ICD-10-CM

## 2015-10-06 DIAGNOSIS — E119 Type 2 diabetes mellitus without complications: Secondary | ICD-10-CM

## 2015-10-06 LAB — POCT GLYCOSYLATED HEMOGLOBIN (HGB A1C): Hemoglobin A1C: 7.2

## 2015-10-06 NOTE — Addendum Note (Signed)
Addended by: Delrae Alfred on: 10/06/2015 01:16 PM   Modules accepted: Orders

## 2015-10-06 NOTE — Progress Notes (Signed)
CC: Antonio Rivera is a 60 y.o. male is here for Diabetes   Subjective: HPI:  Follow-up type 2 diabetes: No postprandial blood sugars to report. Fasting blood sugars are averaging at 140. No hypoglycemic episodes. No vision loss, poorly healing wounds, polyuria, polydipsia or polyphagia.  Follow-up hypertension: Taking lisinopril with 100% compliance. No outside blood pressures report. No lightheadedness, chest pain, shortness of breath orthopnea or peripheral edema.   Review Of Systems Outlined In HPI  Past Medical History  Diagnosis Date  . OSA on CPAP 1999  . HNP (herniated nucleus pulposus)     C6--7  . Hyperlipidemia   . Diabetes mellitus     type 2  . Hypertension     Past Surgical History  Procedure Laterality Date  . Ruptured disk surgery  1999    C6--7  . Tonsillectomy  age 28   Family History  Problem Relation Age of Onset  . Cancer Mother     skin  . Cancer Father 34    prostate/died age 8   . Cancer Sister     ovarian cancer  . Cancer Brother     skin cancer    Social History   Social History  . Marital Status: Single    Spouse Name: N/A  . Number of Children: N/A  . Years of Education: N/A   Occupational History  . Not on file.   Social History Main Topics  . Smoking status: Former Smoker -- 30 years    Types: Pipe  . Smokeless tobacco: Never Used  . Alcohol Use: 0.5 oz/week    1 drink(s) per week     Comment: per week  . Drug Use: No  . Sexual Activity: Not on file   Other Topics Concern  . Not on file   Social History Narrative     Objective: BP 107/75 mmHg  Pulse 77  Temp(Src) 98.1 F (36.7 C) (Oral)  Resp 18  Wt 255 lb (115.667 kg)  SpO2 97%  General: Alert and Oriented, No Acute Distress HEENT: Pupils equal, round, reactive to light. Conjunctivae clear.  Moist mucous membranes Lungs: Clear to auscultation bilaterally, no wheezing/ronchi/rales.  Comfortable work of breathing. Good air movement. Cardiac: Regular rate and  rhythm. Normal S1/S2.  No murmurs, rubs, nor gallops.   Extremities: No peripheral edema.  Strong peripheral pulses.  Mental Status: No depression, anxiety, nor agitation. Skin: Warm and dry.  Assessment & Plan: [name] was seen today for diabetes.  Diagnoses and all orders for this visit:  Essential hypertension, benign  Type 2 diabetes mellitus without complication, without long-term current use of insulin (HCC)    essential hypertension: Controlled continue lisinopril   type 2 diabetes: A1c of 7.2, uncontrolled chronic condition, we discussed juices and other simple sugars that he can cut out in his diet to help get his A1c below 7.0. For now continue on full dose Xigduo XR.   Return in about 3 months (around 01/06/2016) for Blood Sugar and BP.

## 2015-10-20 ENCOUNTER — Other Ambulatory Visit: Payer: Self-pay | Admitting: Family Medicine

## 2015-10-27 ENCOUNTER — Telehealth: Payer: Self-pay

## 2015-10-27 NOTE — Telephone Encounter (Signed)
Pt.notified

## 2015-10-27 NOTE — Telephone Encounter (Signed)
Pt called stating that one of his co-workers was injuried at work and was bleeding profusely.  Some of the blood got him.  He washed his hands thoroughly and made sure he didn't have any cuts or open wounds to the affected areas.  Do you have any other suggestions?

## 2015-10-27 NOTE — Telephone Encounter (Signed)
Will you please let him know that as long as he did not have any open cuts and he wasn't stuck with anything sharp he is not at risk of contracting any diseases provided he was able to wash off all the blood he got on his hands.

## 2016-01-06 ENCOUNTER — Ambulatory Visit (INDEPENDENT_AMBULATORY_CARE_PROVIDER_SITE_OTHER): Payer: BLUE CROSS/BLUE SHIELD | Admitting: Family Medicine

## 2016-01-06 ENCOUNTER — Encounter: Payer: Self-pay | Admitting: Family Medicine

## 2016-01-06 VITALS — BP 109/71 | HR 60 | Wt 264.0 lb

## 2016-01-06 DIAGNOSIS — I10 Essential (primary) hypertension: Secondary | ICD-10-CM | POA: Diagnosis not present

## 2016-01-06 DIAGNOSIS — E119 Type 2 diabetes mellitus without complications: Secondary | ICD-10-CM

## 2016-01-06 LAB — POCT GLYCOSYLATED HEMOGLOBIN (HGB A1C): HEMOGLOBIN A1C: 7.6

## 2016-01-06 MED ORDER — EMPAGLIFLOZIN-METFORMIN HCL 12.5-1000 MG PO TABS
1.0000 | ORAL_TABLET | Freq: Two times a day (BID) | ORAL | Status: DC
Start: 2016-01-06 — End: 2016-06-22

## 2016-01-06 NOTE — Progress Notes (Signed)
CC: Antonio Rivera is a 60 y.o. male is here for Hyperglycemia   Subjective: HPI:  Follow-up type 2 diabetes: No outside blood sugars to report. Denies polyuria or polydipsia. No vision disturbance.  Follow-up essential hypertension: He is taking lisinopril on a daily basis. No outside blood pressures report. No chest pain shortness of breath orthopnea nor peripheral edema   Review Of Systems Outlined In HPI  Past Medical History  Diagnosis Date  . OSA on CPAP 1999  . HNP (herniated nucleus pulposus)     C6--7  . Hyperlipidemia   . Diabetes mellitus     type 2  . Hypertension     Past Surgical History  Procedure Laterality Date  . Ruptured disk surgery  1999    C6--7  . Tonsillectomy  age 26   Family History  Problem Relation Age of Onset  . Cancer Mother     skin  . Cancer Father 27    prostate/died age 54   . Cancer Sister     ovarian cancer  . Cancer Brother     skin cancer    Social History   Social History  . Marital Status: Single    Spouse Name: N/A  . Number of Children: N/A  . Years of Education: N/A   Occupational History  . Not on file.   Social History Main Topics  . Smoking status: Former Smoker -- 30 years    Types: Pipe  . Smokeless tobacco: Never Used  . Alcohol Use: 0.5 oz/week    1 drink(s) per week     Comment: per week  . Drug Use: No  . Sexual Activity: Not on file   Other Topics Concern  . Not on file   Social History Narrative     Objective: BP 109/71 mmHg  Pulse 60  Wt 264 lb (119.75 kg)  Vital signs reviewed. General: Alert and Oriented, No Acute Distress HEENT: Pupils equal, round, reactive to light. Conjunctivae clear.  External ears unremarkable.  Moist mucous membranes. Lungs: Clear and comfortable work of breathing, speaking in full sentences without accessory muscle use. Cardiac: Regular rate and rhythm.  Neuro: CN II-XII grossly intact, gait normal. Extremities: No peripheral edema.  Strong peripheral  pulses.  Mental Status: No depression, anxiety, nor agitation. Logical though process. Skin: Warm and dry.  Assessment & Plan: Antonio Rivera was seen today for hyperglycemia.  Diagnoses and all orders for this visit:  Type 2 diabetes mellitus without complication, without long-term current use of insulin (HCC) -     POCT HgB A1C -     Empagliflozin-Metformin HCl (SYNJARDY) 12.12-998 MG TABS; Take 1 tablet by mouth 2 (two) times daily.  Essential hypertension, benign   Type 2 diabetes: Uncontrolled chronic condition with an A1c of 7.6, switching formulation of his SGLT 2 inhibitor. Essential hypertension: Controlled with lisinopril   Return in about 3 months (around 04/07/2016) for blood sugar.

## 2016-01-12 ENCOUNTER — Telehealth: Payer: Self-pay | Admitting: *Deleted

## 2016-01-12 NOTE — Telephone Encounter (Signed)
PA approved for Synjardy   Case ID # [id number]  Pt notified

## 2016-02-09 ENCOUNTER — Telehealth: Payer: Self-pay | Admitting: Family Medicine

## 2016-02-09 MED ORDER — AMBULATORY NON FORMULARY MEDICATION: Status: DC

## 2016-02-09 NOTE — Telephone Encounter (Signed)
Opened for minipap paperwork

## 2016-03-03 DIAGNOSIS — E119 Type 2 diabetes mellitus without complications: Secondary | ICD-10-CM | POA: Diagnosis not present

## 2016-03-03 DIAGNOSIS — H5203 Hypermetropia, bilateral: Secondary | ICD-10-CM | POA: Diagnosis not present

## 2016-03-03 LAB — HM DIABETES EYE EXAM

## 2016-04-07 ENCOUNTER — Ambulatory Visit (INDEPENDENT_AMBULATORY_CARE_PROVIDER_SITE_OTHER): Payer: BLUE CROSS/BLUE SHIELD | Admitting: Family Medicine

## 2016-04-07 ENCOUNTER — Encounter: Payer: Self-pay | Admitting: Family Medicine

## 2016-04-07 VITALS — BP 106/72 | HR 65 | Wt 253.0 lb

## 2016-04-07 DIAGNOSIS — I10 Essential (primary) hypertension: Secondary | ICD-10-CM | POA: Diagnosis not present

## 2016-04-07 DIAGNOSIS — E119 Type 2 diabetes mellitus without complications: Secondary | ICD-10-CM

## 2016-04-07 DIAGNOSIS — Z23 Encounter for immunization: Secondary | ICD-10-CM | POA: Diagnosis not present

## 2016-04-07 LAB — POCT GLYCOSYLATED HEMOGLOBIN (HGB A1C): Hemoglobin A1C: 7.5

## 2016-04-07 NOTE — Progress Notes (Signed)
CC: Antonio Rivera is a 60 y.o. male is here for Hyperglycemia (3 month A1c)   Subjective: HPI:  Follow-up type 2 diabetes: He is tolerating Synjardy no known side effects. He's also been able to lose about 10 pounds since I saw him last. He has cut back on his bread intake, currently taking only 50% of the amount he used to take in. The highest blood sugar he seen in the last 3 months is 140. He denies polyuria polyphagia or vision disturbance. No motor or sensory disturbances.  Follow-up essential hypertension: Taking lisinopril on a daily basis with 100% compliance. No outside blood pressures to report. Denies chest pain shortness of breath orthopnea nor peripheral edema.   Review Of Systems Outlined In HPI  Past Medical History:  Diagnosis Date  . Diabetes mellitus    type 2  . HNP (herniated nucleus pulposus)    C6--7  . Hyperlipidemia   . Hypertension   . OSA on CPAP 1999    Past Surgical History:  Procedure Laterality Date  . ruptured disk surgery  1999   C6--7  . TONSILLECTOMY  age 34   Family History  Problem Relation Age of Onset  . Cancer Mother     skin  . Cancer Father 95    prostate/died age 8   . Cancer Sister     ovarian cancer  . Cancer Brother     skin cancer    Social History   Social History  . Marital status: Single    Spouse name: N/A  . Number of children: N/A  . Years of education: N/A   Occupational History  . Not on file.   Social History Main Topics  . Smoking status: Former Smoker    Years: 30.00    Types: Pipe  . Smokeless tobacco: Never Used  . Alcohol use 0.5 oz/week    1 drink(s) per week     Comment: per week  . Drug use: No  . Sexual activity: Not on file   Other Topics Concern  . Not on file   Social History Narrative  . No narrative on file     Objective: BP 106/72   Pulse 65   Wt 253 lb (114.8 kg)   BMI 34.31 kg/m   General: Alert and Oriented, No Acute Distress HEENT: Pupils equal, round, reactive to  light. Conjunctivae clear.  Moist mucous membranes Lungs: Clear to auscultation bilaterally, no wheezing/ronchi/rales.  Comfortable work of breathing. Good air movement. Cardiac: Regular rate and rhythm. Normal S1/S2.  No murmurs, rubs, nor gallops.   Extremities: No peripheral edema.  Strong peripheral pulses.  Mental Status: No depression, anxiety, nor agitation. Skin: Warm and dry.  Assessment & Plan: [name] was seen today for hyperglycemia.  Diagnoses and all orders for this visit:  Essential hypertension, benign  Type 2 diabetes mellitus without complication, without long-term current use of insulin (HCC) -     POCT HgB A1C  Encounter for immunization -     Flu Vaccine QUAD 36+ mos IM   Essential hypertension: Controlled continue lisinopril Type 2 diabetes: A1c of 7.5, I discussed the option of starting an additional diabetes medication to get a A1c at goal of 7.0 or less. He would prefer to focus on cutting out bread entirely in his diet and continuing to lose weight.   Discussed with this patient that I will be resigning from my position here with [facility] in September in order to stay with my  family who will be moving to [facility]. I let him know about the providers that are still accepting patients and I feel that this individual will be under great care if he/she stays here with [facility].  Return for [facility] December New Visit With Dr. Georgina Snell (Diabetes).

## 2016-06-15 ENCOUNTER — Encounter: Payer: Self-pay | Admitting: Emergency Medicine

## 2016-06-15 ENCOUNTER — Emergency Department
Admission: EM | Admit: 2016-06-15 | Discharge: 2016-06-15 | Disposition: A | Discharge status: Home / Self Care | Payer: BLUE CROSS/BLUE SHIELD | Source: Home / Self Care | Attending: Family Medicine | Admitting: Family Medicine

## 2016-06-15 DIAGNOSIS — Z8709 Personal history of other diseases of the respiratory system: Secondary | ICD-10-CM

## 2016-06-15 DIAGNOSIS — B9789 Other viral agents as the cause of diseases classified elsewhere: Secondary | ICD-10-CM

## 2016-06-15 DIAGNOSIS — J069 Acute upper respiratory infection, unspecified: Secondary | ICD-10-CM

## 2016-06-15 MED ORDER — BENZONATATE 100 MG PO CAPS: 100.0000 mg | ORAL_CAPSULE | Freq: Three times a day (TID) | ORAL | 0 refills | Status: DC

## 2016-06-15 MED ORDER — AZITHROMYCIN 250 MG PO TABS: 250.0000 mg | ORAL_TABLET | Freq: Every day | ORAL | 0 refills | Status: DC

## 2016-06-15 MED ORDER — ALBUTEROL SULFATE HFA 108 (90 BASE) MCG/ACT IN AERS: 1.0000 | INHALATION_SPRAY | Freq: Four times a day (QID) | RESPIRATORY_TRACT | 0 refills | Status: DC | PRN

## 2016-06-15 NOTE — ED Triage Notes (Signed)
Cough started yesterday, runny nose

## 2016-06-15 NOTE — Discharge Instructions (Signed)
°  Your symptoms are likely due to a virus such as the common cold, however, if you developing worsening chest congestion with shortness of breath, persistent fever (> 100.4*F) for 3 days, or symptoms not improving in 4-5 days, you may fill the antibiotic (azithromycin).  If you do fill the antibiotic,  please take antibiotics as prescribed and be sure to complete entire course even if you start to feel better to ensure infection does not come back. ° °

## 2016-06-15 NOTE — ED Provider Notes (Signed)
CSN: [id number]     Arrival date & time 06/15/16  0809 History   First MD Initiated Contact with Patient 06/15/16 [phone]     Chief Complaint  Patient presents with  . Cough   (Consider location/radiation/quality/duration/timing/severity/associated sxs/prior Treatment) HPI Antonio Rivera is a 60 y.o. male presenting to UC with c/o moderately productive cough with chest tightness that started yesterday. Associated rhinorrhea.  He did take Nyquil last night, which helped him sleep. He uses a CPAP machine at night and notes he was able to sleep well with it on last night by using Breath-right strips.  Denies fever, chills, n/v/d. No sick contacts or recent travel. He notes he was dx bronchitis about 20 years ago and states he was advised he would always have it but denies hx of asthma or COPD.  He has had an inhaler in the past when he gets sick and does well but states he cannot take prednisone as it causes anxiety and anger.  He is a former pipe smoker.    Past Medical History:  Diagnosis Date  . Diabetes mellitus    type 2  . HNP (herniated nucleus pulposus)    C6--7  . Hyperlipidemia   . Hypertension   . OSA on CPAP 1999   Past Surgical History:  Procedure Laterality Date  . ruptured disk surgery  1999   C6--7  . TONSILLECTOMY  age 89   Family History  Problem Relation Age of Onset  . Cancer Mother     skin  . Cancer Father 18    prostate/died age 37   . Cancer Sister     ovarian cancer  . Cancer Brother     skin cancer   Social History  Substance Use Topics  . Smoking status: Former Smoker    Years: 30.00    Types: Pipe  . Smokeless tobacco: Never Used  . Alcohol use 0.5 oz/week    1 Standard drinks or equivalent per week     Comment: per week    Review of Systems  Constitutional: Negative for chills and fever.  HENT: Positive for congestion and rhinorrhea. Negative for ear pain, sore throat, trouble swallowing and voice change.   Respiratory: Positive for cough  and chest tightness. Negative for shortness of breath.   Cardiovascular: Negative for chest pain and palpitations.  Gastrointestinal: Negative for abdominal pain, diarrhea, nausea and vomiting.  Musculoskeletal: Negative for arthralgias, back pain and myalgias.  Skin: Negative for rash.    Allergies  Oysters [shellfish allergy] and Prednisone  Home Medications   Prior to Admission medications   Medication Sig Start Date End Date Taking? Authorizing Provider  albuterol (PROVENTIL HFA;VENTOLIN HFA) 108 (90 Base) MCG/ACT inhaler Inhale 1-2 puffs into the lungs every 6 (six) hours as needed for wheezing or shortness of breath. 06/15/16   Noland Fordyce, PA-C  AMBULATORY NON FORMULARY MEDICATION One Touch Ultra glucometer To use as directed Dx: E11.9 Type 2 Diabetes 08/14/14   Marcial Pacas, DO  AMBULATORY NON FORMULARY MEDICATION Medication Name: CPAP machine, mask, supplies Dx: OSA 02/09/16   Marcial Pacas, DO  aspirin 81 MG EC tablet Take 81 mg by mouth as needed.      Historical Provider, MD  azithromycin (ZITHROMAX) 250 MG tablet Take 1 tablet (250 mg total) by mouth daily. Take first 2 tablets together, then 1 every day until finished. 06/15/16   Noland Fordyce, PA-C  benzonatate (TESSALON) 100 MG capsule Take 1-2 capsules (100-200 mg total) by mouth  every 8 (eight) hours. 06/15/16   Noland Fordyce, PA-C  cetirizine (ZYRTEC) 10 MG tablet Take 1 tablet (10 mg total) by mouth daily. 10/06/13   Sean Hommel, DO  Empagliflozin-Metformin HCl (SYNJARDY) 12.12-998 MG TABS Take 1 tablet by mouth 2 (two) times daily. 01/06/16   Marcial Pacas, DO  Fish Oil OIL by Does not apply route.    Historical Provider, MD  lisinopril (PRINIVIL,ZESTRIL) 10 MG tablet Take 1 tablet (10 mg total) by mouth daily. 07/05/15   Marcial Pacas, DO  Multiple Vitamins-Minerals (MULTIVITAMIN PO) Take 1 tablet by mouth every morning.    Historical Provider, MD  ONE TOUCH ULTRA TEST test strip CHECK YOUR BLOOD SUGAR 2X A DAY AS NEEDED FOR TYPE 2  DIABETES 09/24/15   Marcial Pacas, DO  POTASSIUM PO Take by mouth.    Historical Provider, MD  Red Yeast Rice 600 MG CAPS 2 caps every evening 04/02/13   Marcial Pacas, DO   Meds Ordered and Administered this Visit  Medications - No data to display  BP 111/72 (BP Location: Left Arm)   Pulse 89   Temp 98.4 F (36.9 C) (Oral)   Ht 6' (1.829 m)   Wt 256 lb (116.1 kg)   SpO2 97%   BMI 34.72 kg/m  No data found.   Physical Exam  Constitutional: He appears well-developed and well-nourished. No distress.  HENT:  Head: Normocephalic and atraumatic.  Right Ear: Tympanic membrane normal.  Left Ear: Tympanic membrane normal.  Nose: Mucosal edema and rhinorrhea present. Right sinus exhibits no maxillary sinus tenderness and no frontal sinus tenderness. Left sinus exhibits no maxillary sinus tenderness and no frontal sinus tenderness.  Mouth/Throat: Uvula is midline, oropharynx is clear and moist and mucous membranes are normal.  Eyes: Conjunctivae are normal. No scleral icterus.  Neck: Normal range of motion.  Cardiovascular: Normal rate, regular rhythm and normal heart sounds.   Pulmonary/Chest: Effort normal and breath sounds normal. No respiratory distress. He has no wheezes. He has no rales.  Abdominal: Soft. He exhibits no distension. There is no tenderness.  Musculoskeletal: Normal range of motion.  Neurological: He is alert.  Skin: Skin is warm and dry. He is not diaphoretic.  Nursing note and vitals reviewed.   Urgent Care Course   Clinical Course     Procedures (including critical care time)  Labs Review Labs Reviewed - No data to display  Imaging Review No results found.   MDM   1. Viral URI with cough   2. History of bronchitis    Pt c/o cough, chest tightness, and rhinorrhea that started last night. O2 Sat 97% on RA. Lungs: CTAB. No respiratory distress on exam.  Symptoms likely viral in nature. Discussed use of sinus rinses and Flonase. Pt states he cannot  tolerate stuff up his nose.  Pt also states he cannot take prednisone.  Encouraged pt to try tessalon, albuterol inhaler and humidifier at home.  Encouraged fluids and rest. Prescription to hold with expiration date for Azithromycin provided. Advised to fill if not improving in 1 week or if he develops a persistent fever. Patient verbalized understanding and agreement with treatment plan.     Noland Fordyce, PA-C 06/15/16 [phone]

## 2016-06-22 ENCOUNTER — Other Ambulatory Visit: Payer: Self-pay

## 2016-06-22 DIAGNOSIS — E119 Type 2 diabetes mellitus without complications: Secondary | ICD-10-CM

## 2016-06-22 MED ORDER — EMPAGLIFLOZIN-METFORMIN HCL 12.5-1000 MG PO TABS: 1.0000 | ORAL_TABLET | Freq: Two times a day (BID) | ORAL | 3 refills | Status: DC

## 2016-07-17 ENCOUNTER — Other Ambulatory Visit: Payer: Self-pay | Admitting: *Deleted

## 2016-07-17 ENCOUNTER — Encounter: Payer: Self-pay | Admitting: Family Medicine

## 2016-07-17 ENCOUNTER — Ambulatory Visit (INDEPENDENT_AMBULATORY_CARE_PROVIDER_SITE_OTHER): Payer: BLUE CROSS/BLUE SHIELD | Admitting: Family Medicine

## 2016-07-17 VITALS — BP 114/66 | HR 63 | Wt 259.0 lb

## 2016-07-17 DIAGNOSIS — I10 Essential (primary) hypertension: Secondary | ICD-10-CM

## 2016-07-17 DIAGNOSIS — E119 Type 2 diabetes mellitus without complications: Secondary | ICD-10-CM | POA: Diagnosis not present

## 2016-07-17 DIAGNOSIS — R1310 Dysphagia, unspecified: Secondary | ICD-10-CM | POA: Diagnosis not present

## 2016-07-17 DIAGNOSIS — K2100 Gastro-esophageal reflux disease with esophagitis, without bleeding: Secondary | ICD-10-CM | POA: Insufficient documentation

## 2016-07-17 DIAGNOSIS — Z1159 Encounter for screening for other viral diseases: Secondary | ICD-10-CM

## 2016-07-17 DIAGNOSIS — E785 Hyperlipidemia, unspecified: Secondary | ICD-10-CM

## 2016-07-17 DIAGNOSIS — Z125 Encounter for screening for malignant neoplasm of prostate: Secondary | ICD-10-CM

## 2016-07-17 LAB — CBC
HCT: 48.4 % (ref 38.5–50.0)
Hemoglobin: 16.4 g/dL (ref 13.2–17.1)
MCH: 30.8 pg (ref 27.0–33.0)
MCHC: 33.9 g/dL (ref 32.0–36.0)
MCV: 90.8 fL (ref 80.0–100.0)
MPV: 9.9 fL (ref 7.5–12.5)
PLATELETS: 207 10*3/uL (ref 140–400)
RBC: 5.33 MIL/uL (ref 4.20–5.80)
RDW: 14.6 % (ref 11.0–15.0)
WBC: 6.6 10*3/uL (ref 3.8–10.8)

## 2016-07-17 LAB — LIPID PANEL
Cholesterol: 204 mg/dL — ABNORMAL HIGH (ref <200)
HDL: 47 mg/dL (ref >40)
LDL CALC: 131 mg/dL — AB (ref <100)
TRIGLYCERIDES: 129 mg/dL (ref <150)
Total CHOL/HDL Ratio: 4.3 Ratio (ref <5.0)
VLDL: 26 mg/dL (ref <30)

## 2016-07-17 LAB — COMPLETE METABOLIC PANEL WITH GFR
ALT: 50 U/L — AB (ref 9–46)
AST: 35 U/L (ref 10–35)
Albumin: 4.1 g/dL (ref 3.6–5.1)
Alkaline Phosphatase: 54 U/L (ref 40–115)
BUN: 15 mg/dL (ref 7–25)
CHLORIDE: 105 mmol/L (ref 98–110)
CO2: 25 mmol/L (ref 20–31)
CREATININE: 0.78 mg/dL (ref 0.70–1.25)
Calcium: 9.2 mg/dL (ref 8.6–10.3)
GFR, Est Non African American: >89 mL/min (ref >=60)
GLUCOSE: 186 mg/dL — AB (ref 65–99)
Potassium: 4.5 mmol/L (ref 3.5–5.3)
SODIUM: 138 mmol/L (ref 135–146)
TOTAL PROTEIN: 6.5 g/dL (ref 6.1–8.1)
Total Bilirubin: 0.4 mg/dL (ref 0.2–1.2)

## 2016-07-17 LAB — HEPATITIS C ANTIBODY: HCV AB: NEGATIVE

## 2016-07-17 LAB — PSA: PSA: 0.6 ng/mL (ref <=4.0)

## 2016-07-17 LAB — POCT GLYCOSYLATED HEMOGLOBIN (HGB A1C): HEMOGLOBIN A1C: 7.8

## 2016-07-17 MED ORDER — LISINOPRIL 10 MG PO TABS: 10.0000 mg | ORAL_TABLET | Freq: Every day | ORAL | 0 refills | Status: DC

## 2016-07-17 MED ORDER — SITAGLIPTIN PHOSPHATE 100 MG PO TABS: 100.0000 mg | ORAL_TABLET | Freq: Every day | ORAL | 1 refills | Status: DC

## 2016-07-17 NOTE — Patient Instructions (Signed)
Thank you for coming in today. 1) Add Januvia daily.  Continue diabetes medicines.  Avoid Carbs.   2) You should hear about the referral to [facility] gastroenterology soon.  Call if you have not heard anything in 1 week.   3) Get labs today. I will discuss cholesterol risk once we get blood work back.   Sitagliptin oral tablet What is this medicine? SITAGLIPTIN (sit a GLIP tin) helps to treat type 2 diabetes. It helps to control blood sugar. Treatment is combined with diet and exercise. COMMON BRAND NAME(S): Januvia What should I tell my health care provider before I take this medicine? They need to know if you have any of these conditions: -diabetic ketoacidosis -kidney disease -pancreatitis -previous swelling of the tongue, face, or lips with difficulty breathing, difficulty swallowing, hoarseness, or tightening of the throat -type 1 diabetes -an unusual or allergic reaction to sitagliptin, other medicines, foods, dyes, or preservatives -pregnant or trying to get pregnant -breast-feeding How should I use this medicine? Take this medicine by mouth with a glass of water. Follow the directions on the prescription label. You can take it with or without food. Do not cut, crush or chew this medicine. Take your dose at the same time each day. Do not take more often than directed. Do not stop taking except on your doctor's advice. Talk to your pediatrician regarding the use of this medicine in children. Special care may be needed. What if I miss a dose? If you miss a dose, take it as soon as you can. If it is almost time for your next dose, take only that dose. Do not take double or extra doses. What may interact with this medicine? Do not take this medicine with any of the following medications: -gatifloxacin This medicine may also interact with the following medications: -alcohol -digoxin -insulin -sulfonylureas like glimepiride, glipizide, glyburide What should I watch for while using  this medicine? Visit your doctor or health care professional for regular checks on your progress. A test called the HbA1C (A1C) will be monitored. This is a simple blood test. It measures your blood sugar control over the last 2 to 3 months. You will receive this test every 3 to 6 months. Learn how to check your blood sugar. Learn the symptoms of low and high blood sugar and how to manage them. Always carry a quick-source of sugar with you in case you have symptoms of low blood sugar. Examples include hard sugar candy or glucose tablets. Make sure others know that you can choke if you eat or drink when you develop serious symptoms of low blood sugar, such as seizures or unconsciousness. They must get medical help at once. Tell your doctor or health care professional if you have high blood sugar. You might need to change the dose of your medicine. If you are sick or exercising more than usual, you might need to change the dose of your medicine. Do not skip meals. Ask your doctor or health care professional if you should avoid alcohol. Many nonprescription cough and cold products contain sugar or alcohol. These can affect blood sugar. Wear a medical ID bracelet or chain, and carry a card that describes your disease and details of your medicine and dosage times. What side effects may I notice from receiving this medicine? Side effects that you should report to your doctor or health care professional as soon as possible: -allergic reactions like skin rash, itching or hives, swelling of the face, lips, or tongue -breathing  problems -fever, chills -joint pain -loss of appetite -signs and symptoms of low blood sugar such as feeling anxious, confusion, dizziness, increased hunger, unusually weak or tired, sweating, shakiness, cold, irritable, headache, blurred vision, fast heartbeat, loss of consciousness -unusual stomach pain or discomfort -vomiting Side effects that usually do not require medical  attention (report to your doctor or health care professional if they continue or are bothersome): -diarrhea -headache -sore throat -stomach upset -stuffy or runny nose Where should I keep my medicine? Keep out of the reach of children. Store at room temperature between 15 and 30 degrees C (59 and 86 degrees F). Throw away any unused medicine after the expiration date.  2017 Elsevier/Gold Standard (2014-04-03 17:46:21)

## 2016-07-17 NOTE — Progress Notes (Signed)
Antonio Rivera is a 60 y.o. male who presents to [place] [place] today for follow up diabetes and hypertension, as well as new dysphagia.  Diabetes: No issues with synjardy. Still trying to cut carbs but has gained some weight recently. Home fasting blood glucose values around 150. Denies polydipsia, polyuria, numbness, tingling, weakness.   Hypertension: Doing well on lisinopril. Does not take home BP but says it is around 140/60 whenever he donates platelets.  Dysphagia: For the past 2 weeks after getting over bronchitis, he feels that solids are getting stuck after swallowing. He has to swallow multiple times or drink a lot of fluids to dislodge the food. It is easier to swallow fine or softer foods. No issues with liquids, pain on swallowing, or trouble breathing. He has regurgitated food once. He had reflux in the past but not since the 90s. Former smoker for 30 years.  He denies urinary symptoms (trouble with the stream, incomplete emptying, etc.).  Past Medical History:  Diagnosis Date  . Diabetes mellitus    type 2  . HNP (herniated nucleus pulposus)    C6--7  . Hyperlipidemia   . Hypertension   . OSA on CPAP 1999   Past Surgical History:  Procedure Laterality Date  . ruptured disk surgery  1999   C6--7  . TONSILLECTOMY  age 45   Social History  Substance Use Topics  . Smoking status: Former Smoker    Years: 30.00    Types: Pipe  . Smokeless tobacco: Never Used  . Alcohol use 0.5 oz/week    1 Standard drinks or equivalent per week     Comment: per week   family history includes Cancer in his brother, mother, and sister; Cancer (age of onset: 12) in his father.  ROS as above:  Medications: Current Outpatient Prescriptions  Medication Sig Dispense Refill  . albuterol (PROVENTIL HFA;VENTOLIN HFA) 108 (90 Base) MCG/ACT inhaler Inhale 1-2 puffs into  the lungs every 6 (six) hours as needed for wheezing or shortness of breath. 1 Inhaler 0  . AMBULATORY NON FORMULARY MEDICATION One Touch Ultra glucometer To use as directed Dx: E11.9 Type 2 Diabetes 1 each 0  . AMBULATORY NON FORMULARY MEDICATION Medication Name: CPAP machine, mask, supplies Dx: OSA 1 Units 0  . aspirin 81 MG EC tablet Take 81 mg by mouth as needed.      . cetirizine (ZYRTEC) 10 MG tablet Take 1 tablet (10 mg total) by mouth daily. 60 tablet 0  . Empagliflozin-Metformin HCl (SYNJARDY) 12.12-998 MG TABS Take 1 tablet by mouth 2 (two) times daily. 60 tablet 3  . Fish Oil OIL by Does not apply route.    Marland Kitchen lisinopril (PRINIVIL,ZESTRIL) 10 MG tablet Take 1 tablet (10 mg total) by mouth daily. 90 tablet 3  . Multiple Vitamins-Minerals (MULTIVITAMIN PO) Take 1 tablet by mouth every morning.    . ONE TOUCH ULTRA TEST test strip CHECK YOUR BLOOD SUGAR 2X A DAY AS NEEDED FOR TYPE 2 DIABETES 100 each 2  . POTASSIUM PO Take by mouth.    . Red Yeast Rice 600 MG CAPS 2 caps every evening 2 capsule 1   No current facility-administered medications for this visit.    Allergies  Allergen Reactions  . Oysters [Shellfish Allergy] Nausea And Vomiting  . Prednisone Anxiety    Health Maintenance Health Maintenance  Topic Date Due  . Hepatitis C Screening  Feb 10, 1956  . HIV  Screening  07/29/1970  . OPHTHALMOLOGY EXAM  06/07/2013  . FOOT EXAM  10/07/2014  . ZOSTAVAX  07/30/2015  . HEMOGLOBIN A1C  10/05/2016  . PNEUMOCOCCAL POLYSACCHARIDE VACCINE (2) 04/02/2018  . COLONOSCOPY  04/23/2023  . TETANUS/TDAP  07/05/2024  . INFLUENZA VACCINE  Completed     Exam:  BP 114/66   Pulse 63   Wt 259 lb (117.5 kg)   BMI 35.13 kg/m  Gen: Well NAD HEENT: EOMI,  MMM, no palpable neck masses or thyromegaly, oropharynx normal appearing, uvula midline Lungs: Normal work of breathing. CTABL Heart: RRR no MRG Abd: Obese, NABS, Soft, Nontender Exts: Brisk capillary refill, warm and well perfused.    Diabetic Foot Exam - Simple   Simple Foot Form Diabetic Foot exam was performed with the following findings:  Yes 07/17/2016  9:22 AM  Visual Inspection No deformities, no ulcerations, no other skin breakdown bilaterally:  Yes Sensation Testing Intact to touch and monofilament testing bilaterally:  Yes Pulse Check Posterior Tibialis and Dorsalis pulse intact bilaterally:  Yes Comments       Results for orders placed or performed in visit on 07/17/16 (from the past 72 hour(s))  POCT HgB A1C     Status: None   Collection Time: 07/17/16  8:43 AM  Result Value Ref Range   Hemoglobin A1C 7.8    No results found.    Assessment and Plan: 60 y.o. male with diabetes, hypertension, and new-onset dysphagia.  Diabetes: Worsening despite attempted lifestyle modifications.  - Continue Synjardy, add Januvia. - His 10 year risk of heard disease or stroke is 11.8% based on cholesterol from last year, and he could benefit from a statin. Since we are starting Januvia today and patient is concerned about statin side effects, recheck labs/lipids today and consider adding pravastatin pending results.  Hypertension: Doing well; continue lisinopril.  Dysphagia: Painless, with solids only. Concerning for esophageal stricture, ring, or mass. Refer to GI for EGD.   we'll screen for prostate cancer with PSA today Hep C also ordered.    Orders Placed This Encounter  Procedures  . PSA  . CBC  . COMPLETE METABOLIC PANEL WITH GFR  . Lipid panel  . Hepatitis C antibody  . Ambulatory referral to Gastroenterology    Referral Priority:   Routine    Referral Type:   Consultation    Referral Reason:   Specialty Services Required    Requested Specialty:   Gastroenterology    Number of Visits Requested:   1  . POCT HgB A1C    Discussed warning signs or symptoms. Please see discharge instructions. Patient expresses understanding.

## 2016-07-18 MED ORDER — ATORVASTATIN CALCIUM 20 MG PO TABS: 20.0000 mg | ORAL_TABLET | Freq: Every day | ORAL | 0 refills | Status: DC

## 2016-07-18 NOTE — Addendum Note (Signed)
Addended by: Gregor Hams on: 07/18/2016 07:05 AM   Modules accepted: Orders

## 2016-07-20 ENCOUNTER — Encounter: Payer: Self-pay | Admitting: Family Medicine

## 2016-07-21 DIAGNOSIS — R1310 Dysphagia, unspecified: Secondary | ICD-10-CM | POA: Diagnosis not present

## 2016-07-21 DIAGNOSIS — Z8041 Family history of malignant neoplasm of ovary: Secondary | ICD-10-CM | POA: Diagnosis not present

## 2016-07-21 DIAGNOSIS — F458 Other somatoform disorders: Secondary | ICD-10-CM | POA: Diagnosis not present

## 2016-07-24 DIAGNOSIS — D175 Benign lipomatous neoplasm of intra-abdominal organs: Secondary | ICD-10-CM | POA: Insufficient documentation

## 2016-07-24 DIAGNOSIS — K3189 Other diseases of stomach and duodenum: Secondary | ICD-10-CM | POA: Diagnosis not present

## 2016-07-24 DIAGNOSIS — R1310 Dysphagia, unspecified: Secondary | ICD-10-CM | POA: Diagnosis not present

## 2016-07-24 DIAGNOSIS — K210 Gastro-esophageal reflux disease with esophagitis: Secondary | ICD-10-CM | POA: Diagnosis not present

## 2016-07-24 DIAGNOSIS — F458 Other somatoform disorders: Secondary | ICD-10-CM | POA: Diagnosis not present

## 2016-08-01 ENCOUNTER — Encounter: Payer: Self-pay | Admitting: Family Medicine

## 2016-08-08 ENCOUNTER — Encounter: Payer: Self-pay | Admitting: Family Medicine

## 2016-08-08 DIAGNOSIS — C49A1 Gastrointestinal stromal tumor of esophagus: Secondary | ICD-10-CM | POA: Insufficient documentation

## 2016-08-18 DIAGNOSIS — Z1379 Encounter for other screening for genetic and chromosomal anomalies: Secondary | ICD-10-CM | POA: Insufficient documentation

## 2016-08-30 DIAGNOSIS — Z9989 Dependence on other enabling machines and devices: Secondary | ICD-10-CM | POA: Diagnosis not present

## 2016-08-30 DIAGNOSIS — Z79899 Other long term (current) drug therapy: Secondary | ICD-10-CM | POA: Diagnosis not present

## 2016-08-30 DIAGNOSIS — K319 Disease of stomach and duodenum, unspecified: Secondary | ICD-10-CM | POA: Diagnosis not present

## 2016-08-30 DIAGNOSIS — E669 Obesity, unspecified: Secondary | ICD-10-CM | POA: Diagnosis not present

## 2016-08-30 DIAGNOSIS — Z888 Allergy status to other drugs, medicaments and biological substances status: Secondary | ICD-10-CM | POA: Diagnosis not present

## 2016-08-30 DIAGNOSIS — K3189 Other diseases of stomach and duodenum: Secondary | ICD-10-CM | POA: Diagnosis not present

## 2016-08-30 DIAGNOSIS — G4730 Sleep apnea, unspecified: Secondary | ICD-10-CM | POA: Diagnosis not present

## 2016-08-30 DIAGNOSIS — I10 Essential (primary) hypertension: Secondary | ICD-10-CM | POA: Diagnosis not present

## 2016-08-30 DIAGNOSIS — E1165 Type 2 diabetes mellitus with hyperglycemia: Secondary | ICD-10-CM | POA: Diagnosis not present

## 2016-08-30 DIAGNOSIS — Z87891 Personal history of nicotine dependence: Secondary | ICD-10-CM | POA: Diagnosis not present

## 2016-08-30 DIAGNOSIS — K210 Gastro-esophageal reflux disease with esophagitis: Secondary | ICD-10-CM | POA: Diagnosis not present

## 2016-08-30 DIAGNOSIS — K219 Gastro-esophageal reflux disease without esophagitis: Secondary | ICD-10-CM | POA: Diagnosis not present

## 2016-08-30 DIAGNOSIS — K2960 Other gastritis without bleeding: Secondary | ICD-10-CM | POA: Diagnosis not present

## 2016-08-30 DIAGNOSIS — Z91013 Allergy to seafood: Secondary | ICD-10-CM | POA: Diagnosis not present

## 2016-09-06 DIAGNOSIS — K210 Gastro-esophageal reflux disease with esophagitis: Secondary | ICD-10-CM | POA: Diagnosis not present

## 2016-09-06 DIAGNOSIS — K3189 Other diseases of stomach and duodenum: Secondary | ICD-10-CM | POA: Diagnosis not present

## 2016-10-15 ENCOUNTER — Other Ambulatory Visit: Payer: Self-pay | Admitting: Osteopathic Medicine

## 2016-10-15 ENCOUNTER — Other Ambulatory Visit: Payer: Self-pay | Admitting: Family Medicine

## 2016-10-15 DIAGNOSIS — E119 Type 2 diabetes mellitus without complications: Secondary | ICD-10-CM

## 2016-10-15 DIAGNOSIS — I10 Essential (primary) hypertension: Secondary | ICD-10-CM

## 2016-10-16 ENCOUNTER — Ambulatory Visit (INDEPENDENT_AMBULATORY_CARE_PROVIDER_SITE_OTHER): Payer: BLUE CROSS/BLUE SHIELD | Admitting: Family Medicine

## 2016-10-16 ENCOUNTER — Encounter: Payer: Self-pay | Admitting: Family Medicine

## 2016-10-16 VITALS — BP 114/59 | HR 66 | Wt 258.0 lb

## 2016-10-16 DIAGNOSIS — K2100 Gastro-esophageal reflux disease with esophagitis, without bleeding: Secondary | ICD-10-CM

## 2016-10-16 DIAGNOSIS — I10 Essential (primary) hypertension: Secondary | ICD-10-CM | POA: Diagnosis not present

## 2016-10-16 DIAGNOSIS — E785 Hyperlipidemia, unspecified: Secondary | ICD-10-CM | POA: Diagnosis not present

## 2016-10-16 DIAGNOSIS — E119 Type 2 diabetes mellitus without complications: Secondary | ICD-10-CM

## 2016-10-16 LAB — CBC
HCT: 46.6 % (ref 38.5–50.0)
HEMOGLOBIN: 15.5 g/dL (ref 13.2–17.1)
MCH: 30 pg (ref 27.0–33.0)
MCHC: 33.3 g/dL (ref 32.0–36.0)
MCV: 90.1 fL (ref 80.0–100.0)
MPV: 9.4 fL (ref 7.5–12.5)
PLATELETS: 181 10*3/uL (ref 140–400)
RBC: 5.17 MIL/uL (ref 4.20–5.80)
RDW: 13.8 % (ref 11.0–15.0)
WBC: 6.6 10*3/uL (ref 3.8–10.8)

## 2016-10-16 LAB — LIPID PANEL W/REFLEX DIRECT LDL
CHOLESTEROL: 136 mg/dL (ref <200)
HDL: 48 mg/dL (ref >40)
LDL-CHOLESTEROL: 65 mg/dL
Non-HDL Cholesterol (Calc): 88 mg/dL (ref <130)
TRIGLYCERIDES: 147 mg/dL (ref <150)
Total CHOL/HDL Ratio: 2.8 Ratio (ref <5.0)

## 2016-10-16 LAB — COMPLETE METABOLIC PANEL WITH GFR
ALBUMIN: 4.1 g/dL (ref 3.6–5.1)
ALK PHOS: 54 U/L (ref 40–115)
ALT: 24 U/L (ref 9–46)
AST: 19 U/L (ref 10–35)
BILIRUBIN TOTAL: 0.5 mg/dL (ref 0.2–1.2)
BUN: 16 mg/dL (ref 7–25)
CALCIUM: 9.1 mg/dL (ref 8.6–10.3)
CO2: 26 mmol/L (ref 20–31)
CREATININE: 0.91 mg/dL (ref 0.70–1.25)
Chloride: 103 mmol/L (ref 98–110)
Glucose, Bld: 145 mg/dL — ABNORMAL HIGH (ref 65–99)
Potassium: 4.5 mmol/L (ref 3.5–5.3)
Sodium: 139 mmol/L (ref 135–146)
TOTAL PROTEIN: 6.7 g/dL (ref 6.1–8.1)

## 2016-10-16 LAB — POCT GLYCOSYLATED HEMOGLOBIN (HGB A1C): Hemoglobin A1C: 7

## 2016-10-16 MED ORDER — AMBULATORY NON FORMULARY MEDICATION: 0 refills | Status: DC

## 2016-10-16 NOTE — Patient Instructions (Signed)
Thank you for coming in today. Get labs Continue current medicines.  Recheck in 3 months.   Return sooner if needed.

## 2016-10-16 NOTE — Progress Notes (Signed)
Antonio Rivera is a 61 y.o. male who presents to [facility] today for follow-up diabetes hypertension and hyperlipidemia.  Diabetes: Patient is doing well with medications listed below. He notes his average fasting sugars are around 120. He denies polyuria or polydipsia and feels well. He notes that he thinks he needs a new diabetes meter.  Hypertension: Patient is doing well with lisinopril. He denies chest pain palpitations or shortness of breath.  Hyperlipidemia: Patient is doing well with atorvastatin. He denies muscle aches or pains and feels well.  Gastritis and GERD: Interim patient has had evaluation with EGD by gastroenterology. He had dilation and biopsy. Diagnosis this time is unclear. It does not appear that he has Barrett's esophagus. He is taking Protonix daily. He feels well on this medication.   Past Medical History:  Diagnosis Date  . Diabetes mellitus    type 2  . HNP (herniated nucleus pulposus)    C6--7  . Hyperlipidemia   . Hypertension   . OSA on CPAP 1999   Past Surgical History:  Procedure Laterality Date  . ruptured disk surgery  1999   C6--7  . TONSILLECTOMY  age 35   Social History  Substance Use Topics  . Smoking status: Former Smoker    Years: 30.00    Types: Pipe  . Smokeless tobacco: Never Used  . Alcohol use 0.5 oz/week    1 Standard drinks or equivalent per week     Comment: per week   family history includes Cancer in his brother, mother, and sister; Cancer (age of onset: 33) in his father.  ROS as above:  Medications: Current Outpatient Prescriptions  Medication Sig Dispense Refill  . albuterol (PROVENTIL HFA;VENTOLIN HFA) 108 (90 Base) MCG/ACT inhaler Inhale 1-2 puffs into the lungs every 6 (six) hours as needed for wheezing or shortness of breath. 1 Inhaler 0  . AMBULATORY NON FORMULARY MEDICATION Medication  Name: CPAP machine, mask, supplies Dx: OSA 1 Units 0  . AMBULATORY NON FORMULARY MEDICATION Glucometer of choice with test test strips, lancets and other supplies.  Test daily To use as directed Dx: E11.9 Type 2 Diabetes 1 each 0  . aspirin 81 MG EC tablet Take 81 mg by mouth as needed.      Marland Kitchen atorvastatin (LIPITOR) 20 MG tablet Take 1 tablet (20 mg total) by mouth daily. 90 tablet 0  . cetirizine (ZYRTEC) 10 MG tablet Take 1 tablet (10 mg total) by mouth daily. 60 tablet 0  . Fish Oil OIL by Does not apply route.    Marland Kitchen lisinopril (PRINIVIL,ZESTRIL) 10 MG tablet Take 1 tablet (10 mg total) by mouth daily. 90 tablet 0  . Multiple Vitamins-Minerals (MULTIVITAMIN PO) Take 1 tablet by mouth every morning.    . pantoprazole (PROTONIX) 40 MG tablet Take by mouth.    Marland Kitchen POTASSIUM PO Take by mouth.    . Red Yeast Rice 600 MG CAPS 2 caps every evening 2 capsule 1  . sitaGLIPtin (JANUVIA) 100 MG tablet Take 1 tablet (100 mg total) by mouth daily. 90 tablet 1  . SYNJARDY 12.12-998 MG TABS TAKE 1 TABLET BY MOUTH 2 (TWO) TIMES DAILY. 60 tablet 3   No current facility-administered medications for this visit.    Allergies  Allergen Reactions  . Oysters [Shellfish Allergy] Nausea And Vomiting  . Prednisone Anxiety    Health Maintenance Health Maintenance  Topic Date Due  . HEMOGLOBIN A1C  01/15/2017  . OPHTHALMOLOGY EXAM  03/03/2017  . FOOT EXAM  07/17/2017  . PNEUMOCOCCAL POLYSACCHARIDE VACCINE (2) 04/02/2018  . COLONOSCOPY  04/23/2023  . TETANUS/TDAP  07/05/2024  . INFLUENZA VACCINE  Completed  . Hepatitis C Screening  Completed  . HIV Screening  Addressed     Exam:  BP (!) 114/59   Pulse 66   Wt 258 lb (117 kg)   BMI 34.99 kg/m  Gen: Well NAD HEENT: EOMI,  MMM  Lungs: Normal work of breathing. CTABL Heart: RRR no MRG Abd: NABS, Soft. Nondistended, Nontender Exts: Brisk capillary refill, warm and well perfused.    Results for orders placed or performed in visit on 10/16/16  (from the past 72 hour(s))  POCT HgB A1C     Status: None   Collection Time: 10/16/16  8:53 AM  Result Value Ref Range   Hemoglobin A1C 7.0    No results found.    Assessment and Plan: 61 y.o. male with  Diabetes: Doing well continue current regimen. Write for new meter.   Hypertension: Doing well continue current regimen.  Hyperlipidemia: Doing well continue current regimen check fasting lipids:  GERD: Doing well. Follow-up gastrology per recommendations.    Orders Placed This Encounter  Procedures  . CBC  . COMPLETE METABOLIC PANEL WITH GFR  . Lipid Panel w/reflex Direct LDL  . POCT HgB A1C   Meds ordered this encounter  Medications  . pantoprazole (PROTONIX) 40 MG tablet    Sig: Take by mouth.  . AMBULATORY NON FORMULARY MEDICATION    Sig: Glucometer of choice with test test strips, lancets and other supplies.  Test daily To use as directed Dx: E11.9 Type 2 Diabetes    Dispense:  1 each    Refill:  0     Discussed warning signs or symptoms. Please see discharge instructions. Patient expresses understanding.

## 2016-10-17 MED ORDER — ATORVASTATIN CALCIUM 20 MG PO TABS
20.0000 mg | ORAL_TABLET | Freq: Every day | ORAL | 1 refills | Status: DC
Start: 2016-10-17 — End: 2017-04-01

## 2016-10-17 NOTE — Addendum Note (Signed)
Addended by: Gregor Hams on: 10/17/2016 08:15 AM   Modules accepted: Orders

## 2016-11-16 ENCOUNTER — Ambulatory Visit (INDEPENDENT_AMBULATORY_CARE_PROVIDER_SITE_OTHER): Payer: BLUE CROSS/BLUE SHIELD

## 2016-11-16 ENCOUNTER — Encounter: Payer: Self-pay | Admitting: Sports Medicine

## 2016-11-16 ENCOUNTER — Ambulatory Visit (INDEPENDENT_AMBULATORY_CARE_PROVIDER_SITE_OTHER): Payer: BLUE CROSS/BLUE SHIELD | Admitting: Sports Medicine

## 2016-11-16 DIAGNOSIS — M5416 Radiculopathy, lumbar region: Secondary | ICD-10-CM

## 2016-11-16 DIAGNOSIS — M5136 Other intervertebral disc degeneration, lumbar region: Secondary | ICD-10-CM

## 2016-11-16 DIAGNOSIS — M545 Low back pain: Secondary | ICD-10-CM | POA: Diagnosis not present

## 2016-11-16 MED ORDER — CYCLOBENZAPRINE HCL 10 MG PO TABS: ORAL_TABLET | ORAL | 0 refills | Status: DC

## 2016-11-16 MED ORDER — MELOXICAM 15 MG PO TABS: ORAL_TABLET | ORAL | 3 refills | Status: DC

## 2016-11-16 MED ORDER — DEXAMETHASONE 4 MG PO TABS: 4.0000 mg | ORAL_TABLET | Freq: Two times a day (BID) | ORAL | 0 refills | Status: DC

## 2016-11-16 NOTE — Assessment & Plan Note (Signed)
Left L5 versus S1 distribution, Decadron, Flexeril at bedtime, meloxicam, x-rays, formal physical therapy. Return to see me in one month, MRI for interventional planning if no better.

## 2016-11-16 NOTE — Progress Notes (Signed)
   Subjective:    I'm seeing this patient as a consultation for:  Dr. Lynne Leader  CC: back pain that radiates down the left leg  HPI: Mr. Meikle is a 61 y.o. Male with a history of cervical radiculopathy presents with left sided back pain that radiates down the leg. Pt reports he was moving this morning and made a sharp turn when he felt a sharp pain in his back. The pain radiates down the back of the left leg. He was not lifting heavy objects when it happened. He denies loss of bowel or urinary function. Patient has not tried anything for the pain. He says it improves with leaning forward.  Past medical history:  Cervical radiculopathy.  See flowsheet/record as well for more information.  Surgical history: Negative.  See flowsheet/record as well for more information.  Family history: Negative.  See flowsheet/record as well for more information.  Social history: Negative.  See flowsheet/record as well for more information.  Allergies, and medications have been entered into the medical record, reviewed, and no changes needed.   Review of Systems: No headache, visual changes, nausea, vomiting, diarrhea, constipation, dizziness, abdominal pain, skin rash, fevers, chills, night sweats, weight loss, swollen lymph nodes, body aches, joint swelling, muscle aches, chest pain, shortness of breath, mood changes, visual or auditory hallucinations.   Objective:   General: Well Developed, well nourished, and in no acute distress.  Neuro/Psych: Alert and oriented x3, extra-ocular muscles intact, able to move all 4 extremities, sensation grossly intact. Skin: Warm and dry, no rashes noted.  Respiratory: Not using accessory muscles, speaking in full sentences, trachea midline.  Cardiovascular: Pulses palpable, no extremity edema. Abdomen: Does not appear distended. MSK:  Pain is localized in sacral spine and radiates down S1 distribution.  Patellar reflexes 2+ bilaterally. Achilles reflexes absent  bilaterally.  ROM intact in all planes for knee, hip, and ankle joints. Pain and diminished strength with resisted left knee flexion.  Strength against resistance otherwise intact bilaterally on knee extension, hip flexion, extension, abduction, adduction, foot dorsiflexion and plantarflexion.   Impression and Recommendations:   This case required medical decision making of moderate complexity.  Mr. Grundman is a 61 y.o. Male with a history of cervical radiculopathy who presents with left S1 radiculopathy likely secondary to disc herniation. X-ray ordered for evaluation. Pt referred to physical therapy. Pt started on Decadron, flexeril, meloxicam. Follow-up in 1 month.

## 2016-12-14 ENCOUNTER — Encounter: Payer: Self-pay | Admitting: Sports Medicine

## 2016-12-14 ENCOUNTER — Ambulatory Visit (INDEPENDENT_AMBULATORY_CARE_PROVIDER_SITE_OTHER): Payer: BLUE CROSS/BLUE SHIELD | Admitting: Sports Medicine

## 2016-12-14 DIAGNOSIS — M5416 Radiculopathy, lumbar region: Secondary | ICD-10-CM | POA: Diagnosis not present

## 2016-12-14 NOTE — Assessment & Plan Note (Signed)
Initially left L5 versus S1 distribution, did well with Decadron, Flexeril, meloxicam.  Only had some insomnia with decadron. Never did formal physical therapy, I am going to get him home exercise program. I also like him to discuss weight loss with his PCP.

## 2016-12-14 NOTE — Progress Notes (Signed)
  Subjective:    CC: Follow-up  HPI: Left lumbar radiculitis: Resolved with Decadron, Flexeril, meloxicam. He had a bit of insomnia with Decadron and tells me he would not like to do steroids again. He never ever did any physical therapy.  Past medical history:  Negative.  See flowsheet/record as well for more information.  Surgical history: Negative.  See flowsheet/record as well for more information.  Family history: Negative.  See flowsheet/record as well for more information.  Social history: Negative.  See flowsheet/record as well for more information.  Allergies, and medications have been entered into the medical record, reviewed, and no changes needed.   Review of Systems: No fevers, chills, night sweats, weight loss, chest pain, or shortness of breath.   Objective:    General: Well Developed, well nourished, and in no acute distress.  Neuro: Alert and oriented x3, extra-ocular muscles intact, sensation grossly intact.  HEENT: Normocephalic, atraumatic, pupils equal round reactive to light, neck supple, no masses, no lymphadenopathy, thyroid nonpalpable.  Skin: Warm and dry, no rashes. Cardiac: Regular rate and rhythm, no murmurs rubs or gallops, no lower extremity edema.  Respiratory: Clear to auscultation bilaterally. Not using accessory muscles, speaking in full sentences.  Impression and Recommendations:    Left lumbar radiculitis Initially left L5 versus S1 distribution, did well with Decadron, Flexeril, meloxicam.  Only had some insomnia with decadron. Never did formal physical therapy, I am going to get him home exercise program. I also like him to discuss weight loss with his PCP.  I spent 25 minutes with this patient, greater than 50% was face-to-face time counseling regarding the above diagnoses

## 2017-01-09 ENCOUNTER — Other Ambulatory Visit: Payer: Self-pay | Admitting: Family Medicine

## 2017-01-09 DIAGNOSIS — E119 Type 2 diabetes mellitus without complications: Secondary | ICD-10-CM

## 2017-01-16 ENCOUNTER — Ambulatory Visit (INDEPENDENT_AMBULATORY_CARE_PROVIDER_SITE_OTHER): Payer: BLUE CROSS/BLUE SHIELD | Admitting: Family Medicine

## 2017-01-16 ENCOUNTER — Encounter: Payer: Self-pay | Admitting: Family Medicine

## 2017-01-16 VITALS — BP 128/63 | HR 55 | Wt 265.0 lb

## 2017-01-16 DIAGNOSIS — E119 Type 2 diabetes mellitus without complications: Secondary | ICD-10-CM

## 2017-01-16 DIAGNOSIS — E785 Hyperlipidemia, unspecified: Secondary | ICD-10-CM | POA: Diagnosis not present

## 2017-01-16 DIAGNOSIS — I10 Essential (primary) hypertension: Secondary | ICD-10-CM | POA: Diagnosis not present

## 2017-01-16 DIAGNOSIS — M5416 Radiculopathy, lumbar region: Secondary | ICD-10-CM | POA: Diagnosis not present

## 2017-01-16 LAB — POCT GLYCOSYLATED HEMOGLOBIN (HGB A1C): Hemoglobin A1C: 7.1

## 2017-01-16 NOTE — Patient Instructions (Signed)
Thank you for coming in today. Recheck in 3 months.  Work on reduced carbs.,  Try to eat less than 45g of carbs per meal.  Pay attention to your nutrition.  Try to eat less than 2500 calories per day.  You will lose weight with this.  You are getting exercise at work.

## 2017-01-16 NOTE — Progress Notes (Signed)
Antonio Rivera is a 61 y.o. male who presents to [facility] today for follow-up diabetes hypertension and obesity and lumbar radiculopathy.  Diabetes: Patient takes Januvia Jardiance and metformin. He notes that his blood sugars are typically pretty well controlled. He denies any episodes of hyperglycemia or polyuria polydipsia. He previously has done well with a low carbohydrate diet. He no longer is pain attention to his diet very closely.  Hypertension: Patient takes lisinopril daily. He notes this works quite well with no noxious side effects. No cough or lip swelling chest pain or palpitations.  Hyperlipidemia: Patient takes Lipitor daily and denies significant muscle aches or pain.  Obesity: Patient has gained weight recently. 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 He notes this is exhausting and he does not have time to exercise. He no longer is pain attention to his diet  Lumbar radiculopathy: In the interim patient developed lumbar radiculopathy and was seen by my colleague Dr. Dianah Field. He was given Flexeril and dexamethasone. This worked reasonably well. The dexamethasone cause significant jitteriness however. He is currently treating his lumbar radicular symptoms with an inversion table which seems to be working quite well. He does not have significant symptoms   Past Medical History:  Diagnosis Date  . Diabetes mellitus    type 2  . HNP (herniated nucleus pulposus)    C6--7  . Hyperlipidemia   . Hypertension   . OSA on CPAP 1999   Past Surgical History:  Procedure Laterality Date  . ruptured disk surgery  1999   C6--7  . TONSILLECTOMY  age 18   Social History  Substance Use Topics  . Smoking status: Former Smoker    Years: 30.00    Types: Pipe  . Smokeless tobacco: Never  Used  . Alcohol use 0.5 oz/week    1 Standard drinks or equivalent per week     Comment: per week   family history includes Cancer in his brother, mother, and sister; Cancer (age of onset: 68) in his father.  ROS as above:  Medications: Current Outpatient Prescriptions  Medication Sig Dispense Refill  . albuterol (PROVENTIL HFA;VENTOLIN HFA) 108 (90 Base) MCG/ACT inhaler Inhale 1-2 puffs into the lungs every 6 (six) hours as needed for wheezing or shortness of breath. 1 Inhaler 0  . AMBULATORY NON FORMULARY MEDICATION Medication Name: CPAP machine, mask, supplies Dx: OSA 1 Units 0  . AMBULATORY NON FORMULARY MEDICATION Glucometer of choice with test test strips, lancets and other supplies.  Test daily To use as directed Dx: E11.9 Type 2 Diabetes 1 each 0  . aspirin 81 MG EC tablet Take 81 mg by mouth as needed.      Marland Kitchen atorvastatin (LIPITOR) 20 MG tablet Take 1 tablet (20 mg total) by mouth daily. 90 tablet 1  . cetirizine (ZYRTEC) 10 MG tablet Take 1 tablet (10 mg total) by mouth daily. 60 tablet 0  . Fish Oil OIL by Does not apply route.    Marland Kitchen JANUVIA 100 MG tablet TAKE 1 TABLET (100 MG TOTAL) BY MOUTH DAILY. 90 tablet 0  . lisinopril (PRINIVIL,ZESTRIL) 10 MG tablet TAKE 1 TABLET (10 MG TOTAL) BY MOUTH DAILY. 90 tablet 0  . meloxicam (MOBIC) 15 MG tablet One tab PO qAM with breakfast for 2 weeks, then daily prn pain. 30 tablet 3  . Multiple Vitamins-Minerals (MULTIVITAMIN  PO) Take 1 tablet by mouth every morning.    . pantoprazole (PROTONIX) 40 MG tablet Take by mouth.    Marland Kitchen POTASSIUM PO Take by mouth.    . Red Yeast Rice 600 MG CAPS 2 caps every evening 2 capsule 1  . SYNJARDY 12.12-998 MG TABS TAKE 1 TABLET BY MOUTH 2 (TWO) TIMES DAILY. 60 tablet 3   No current facility-administered medications for this visit.    Allergies  Allergen Reactions  . Oysters [Shellfish Allergy] Nausea And Vomiting  . Prednisone Anxiety    Health Maintenance Health Maintenance  Topic Date Due  .  OPHTHALMOLOGY EXAM  03/03/2017  . INFLUENZA VACCINE  03/07/2017  . HEMOGLOBIN A1C  04/18/2017  . FOOT EXAM  07/17/2017  . PNEUMOCOCCAL POLYSACCHARIDE VACCINE (2) 04/02/2018  . COLONOSCOPY  04/23/2023  . TETANUS/TDAP  07/05/2024  . Hepatitis C Screening  Completed  . HIV Screening  Completed     Exam:  BP 128/63   Pulse (!) 55   Wt 265 lb (120.2 kg)   BMI 35.94 kg/m \ Wt Readings from Last 10 Encounters:  01/16/17 265 lb (120.2 kg)  12/14/16 259 lb (117.5 kg)  11/16/16 259 lb 4.8 oz (117.6 kg)  10/16/16 258 lb (117 kg)  07/17/16 259 lb (117.5 kg)  06/15/16 256 lb (116.1 kg)  04/07/16 253 lb (114.8 kg)  01/06/16 264 lb (119.7 kg)  10/06/15 255 lb (115.7 kg)  07/05/15 254 lb (115.2 kg)    Gen: Well NAD HEENT: EOMI,  MMM Lungs: Normal work of breathing. CTABL Heart: RRR no MRG Abd: NABS, Soft. Nondistended, Nontender Exts: Brisk capillary refill, warm and well perfused.    Results for orders placed or performed in visit on 01/16/17 (from the past 72 hour(s))  POCT HgB A1C     Status: None   Collection Time: 01/16/17  8:45 AM  Result Value Ref Range   Hemoglobin A1C 7.1    No results found.    Assessment and Plan: 61 y.o. male with  Diabetes: Doing quite well. A1c is at goal today. Workup a lower carbohydrate diet is a think this will help with diabetes and weight loss.  Hypertension: Blood pressure goal continue current regimen.  Hyperlipidemia: Seems to be doing quite well. Continue current regimen.  Obesity: Patient has gained weight recently. We had a lengthy discussion about strategies. Plan to try to consume less than 45 g of carbs per meal and eat less than 2500 cal per day. We'll start food log in and will recheck in 3 months. He additionally had a discussion about potential medications for weight loss. He still thinking about it. If he would like I think GLP-1 agonist would be a good idea as this would help with diabetes and improve satiety.   Orders  Placed This Encounter  Procedures  . POCT HgB A1C   No orders of the defined types were placed in this encounter.    Discussed warning signs or symptoms. Please see discharge instructions. Patient expresses understanding.  I spent 40 minutes with this patient, greater than 50% was face-to-face time counseling regarding the above diagnosis.

## 2017-02-19 ENCOUNTER — Other Ambulatory Visit: Payer: Self-pay | Admitting: Family Medicine

## 2017-02-19 DIAGNOSIS — I10 Essential (primary) hypertension: Secondary | ICD-10-CM

## 2017-03-04 ENCOUNTER — Other Ambulatory Visit: Payer: Self-pay | Admitting: Sports Medicine

## 2017-03-04 ENCOUNTER — Other Ambulatory Visit: Payer: Self-pay | Admitting: Family Medicine

## 2017-03-04 DIAGNOSIS — M5416 Radiculopathy, lumbar region: Secondary | ICD-10-CM

## 2017-03-04 DIAGNOSIS — E119 Type 2 diabetes mellitus without complications: Secondary | ICD-10-CM

## 2017-03-05 DIAGNOSIS — E119 Type 2 diabetes mellitus without complications: Secondary | ICD-10-CM | POA: Diagnosis not present

## 2017-03-05 LAB — HM DIABETES EYE EXAM

## 2017-03-09 ENCOUNTER — Encounter: Payer: Self-pay | Admitting: Family Medicine

## 2017-04-01 ENCOUNTER — Other Ambulatory Visit: Payer: Self-pay | Admitting: Family Medicine

## 2017-04-07 ENCOUNTER — Other Ambulatory Visit: Payer: Self-pay | Admitting: Family Medicine

## 2017-04-07 DIAGNOSIS — E119 Type 2 diabetes mellitus without complications: Secondary | ICD-10-CM

## 2017-04-18 ENCOUNTER — Ambulatory Visit: Payer: BLUE CROSS/BLUE SHIELD | Admitting: Family Medicine

## 2017-04-24 ENCOUNTER — Encounter: Payer: Self-pay | Admitting: Family Medicine

## 2017-04-24 ENCOUNTER — Ambulatory Visit (INDEPENDENT_AMBULATORY_CARE_PROVIDER_SITE_OTHER): Payer: BLUE CROSS/BLUE SHIELD | Admitting: Family Medicine

## 2017-04-24 VITALS — BP 127/67 | HR 67 | Wt 267.0 lb

## 2017-04-24 DIAGNOSIS — I10 Essential (primary) hypertension: Secondary | ICD-10-CM | POA: Diagnosis not present

## 2017-04-24 DIAGNOSIS — E119 Type 2 diabetes mellitus without complications: Secondary | ICD-10-CM | POA: Diagnosis not present

## 2017-04-24 DIAGNOSIS — Z23 Encounter for immunization: Secondary | ICD-10-CM | POA: Diagnosis not present

## 2017-04-24 DIAGNOSIS — E785 Hyperlipidemia, unspecified: Secondary | ICD-10-CM

## 2017-04-24 LAB — POCT GLYCOSYLATED HEMOGLOBIN (HGB A1C): Hemoglobin A1C: 7

## 2017-04-24 NOTE — Progress Notes (Signed)
Antonio Rivera is a 61 y.o. male who presents to [facility] today for follow-up diabetes hypertension and hyperlipidemia.  Diabetes: Patient takes Synjardi and Januvia daily. He tolerates medication well. He notes his blood sugars are typically in the 120s. He denies any hyper or hypoglycemic episodes. He denies significant polyuria or polydipsia. Overall he feels well.  Hypertension: Patient takes lisinopril listed below. He denies chest pain palpitations or shortness of breath. He denies any lightheadedness or dizziness.  Hyperlipidemia: Patient takes atorvastatin 20 mg daily along with fish oil and red rice yeast extract. He denies significant muscle aches or pain and feels well.   Past Medical History:  Diagnosis Date  . Diabetes mellitus    type 2  . HNP (herniated nucleus pulposus)    C6--7  . Hyperlipidemia   . Hypertension   . OSA Rivera CPAP 1999   Past Surgical History:  Procedure Laterality Date  . ruptured disk surgery  1999   C6--7  . TONSILLECTOMY  age 33   Social History  Substance Use Topics  . Smoking status: Former Smoker    Years: 30.00    Types: Pipe  . Smokeless tobacco: Never Used  . Alcohol use 0.5 oz/week    1 Standard drinks or equivalent per week     Comment: per week   family history includes Cancer in his brother, mother, and sister; Cancer (age of onset: 65) in his father.  ROS as above:  Medications: Current Outpatient Prescriptions  Medication Sig Dispense Refill  . albuterol (PROVENTIL HFA;VENTOLIN HFA) 108 (90 Base) MCG/ACT inhaler Inhale 1-2 puffs into the lungs every 6 (six) hours as needed for wheezing or shortness of breath. 1 Inhaler 0  . AMBULATORY NON FORMULARY MEDICATION Medication Name: CPAP machine, mask, supplies Dx: OSA 1 Units 0  . AMBULATORY NON FORMULARY MEDICATION Glucometer of choice with test test  strips, lancets and other supplies.  Test daily To use as directed Dx: E11.9 Type 2 Diabetes 1 each 0  . aspirin 81 MG EC tablet Take 81 mg by mouth as needed.      Marland Kitchen atorvastatin (LIPITOR) 20 MG tablet TAKE 1 TABLET (20 MG TOTAL) BY MOUTH DAILY. 90 tablet 1  . cetirizine (ZYRTEC) 10 MG tablet Take 1 tablet (10 mg total) by mouth daily. 60 tablet 0  . Fish Oil OIL by Does not apply route.    Marland Kitchen JANUVIA 100 MG tablet TAKE 1 TABLET (100 MG TOTAL) BY MOUTH DAILY. 90 tablet 0  . lisinopril (PRINIVIL,ZESTRIL) 10 MG tablet TAKE 1 TABLET (10 MG TOTAL) BY MOUTH DAILY. 90 tablet 0  . meloxicam (MOBIC) 15 MG tablet TAKE 1 TABLET IN THE MORNING WITH BREAKFAST FOR 2 WEEKS THEN DAILY AS NEEDED FOR PAIN 30 tablet 3  . Multiple Vitamins-Minerals (MULTIVITAMIN PO) Take 1 tablet by mouth every morning.    . pantoprazole (PROTONIX) 40 MG tablet Take by mouth.    Marland Kitchen POTASSIUM PO Take by mouth.    . Red Yeast Rice 600 MG CAPS 2 caps every evening 2 capsule 1  . SYNJARDY 12.12-998 MG TABS TAKE 1 TABLET BY MOUTH 2 (TWO) TIMES DAILY. 60 tablet 3   No current facility-administered medications for this visit.    Allergies  Allergen Reactions  . Oysters [Shellfish Allergy] Nausea And Vomiting  . Prednisone Anxiety    Health Maintenance Health Maintenance  Topic Date Due  . FOOT EXAM  07/17/2017  .  HEMOGLOBIN A1C  10/22/2017  . OPHTHALMOLOGY EXAM  03/05/2018  . PNEUMOCOCCAL POLYSACCHARIDE VACCINE (2) 04/02/2018  . COLONOSCOPY  04/23/2023  . TETANUS/TDAP  07/05/2024  . INFLUENZA VACCINE  Completed  . Hepatitis C Screening  Completed  . HIV Screening  Completed     Exam:  BP 127/67   Pulse 67   Wt 267 lb (121.1 kg)   BMI 36.21 kg/m   Wt Readings from Last 10 Encounters:  04/24/17 267 lb (121.1 kg)  01/16/17 265 lb (120.2 kg)  12/14/16 259 lb (117.5 kg)  11/16/16 259 lb 4.8 oz (117.6 kg)  10/16/16 258 lb (117 kg)  07/17/16 259 lb (117.5 kg)  06/15/16 256 lb (116.1 kg)  04/07/16 253 lb (114.8  kg)  01/06/16 264 lb (119.7 kg)  10/06/15 255 lb (115.7 kg)    Gen: Well NAD HEENT: EOMI,  MMM Lungs: Normal work of breathing. CTABL Heart: RRR no MRG Abd: NABS, Soft. Nondistended, Nontender Exts: Brisk capillary refill, warm and well perfused.    Results for orders placed or performed in visit Rivera 04/24/17 (from the past 72 hour(s))  POCT HgB A1C     Status: None   Collection Time: 04/24/17  8:30 AM  Result Value Ref Range   Hemoglobin A1C 7.0    No results found.    Assessment and Plan: 61 y.o. male with  Diabetes: Doing well. A1c control. Continue current medications and continue lower carbohydrate diet. Recheck in 3 months.  Hypertension: Blood pressure at goal. Continue current regimen. Check fasting labs in March 2019.  Hyperlipidemia: Doing well continue current regimen and recheck labs in March 2019.  Flu vaccine given today.  Discussed Shingrix vaccine   Orders Placed This Encounter  Procedures  . Flu Vaccine QUAD 36+ mos IM  . POCT HgB A1C   No orders of the defined types were placed in this encounter.    Discussed warning signs or symptoms. Please see discharge instructions. Patient expresses understanding.

## 2017-04-24 NOTE — Patient Instructions (Addendum)
Thank you for coming in today. Contact your health insurance and ask about Shingrix vaccine.  We will want to recheck in 3 months.  Continue to watch carbs.  You are doing great.  Recheck in 3 months or sooner if needed.

## 2017-06-05 ENCOUNTER — Other Ambulatory Visit: Payer: Self-pay | Admitting: Family Medicine

## 2017-06-05 DIAGNOSIS — I10 Essential (primary) hypertension: Secondary | ICD-10-CM

## 2017-06-05 IMAGING — DX DG LUMBAR SPINE COMPLETE 4+V
5 series · 5 of 5 positions shown · non-contrast
Comparison: None.

CLINICAL DATA: Left-sided lower back pain for several weeks without
known injury.

EXAM:
LUMBAR SPINE - COMPLETE 4+ VIEW

[l-spine ap]
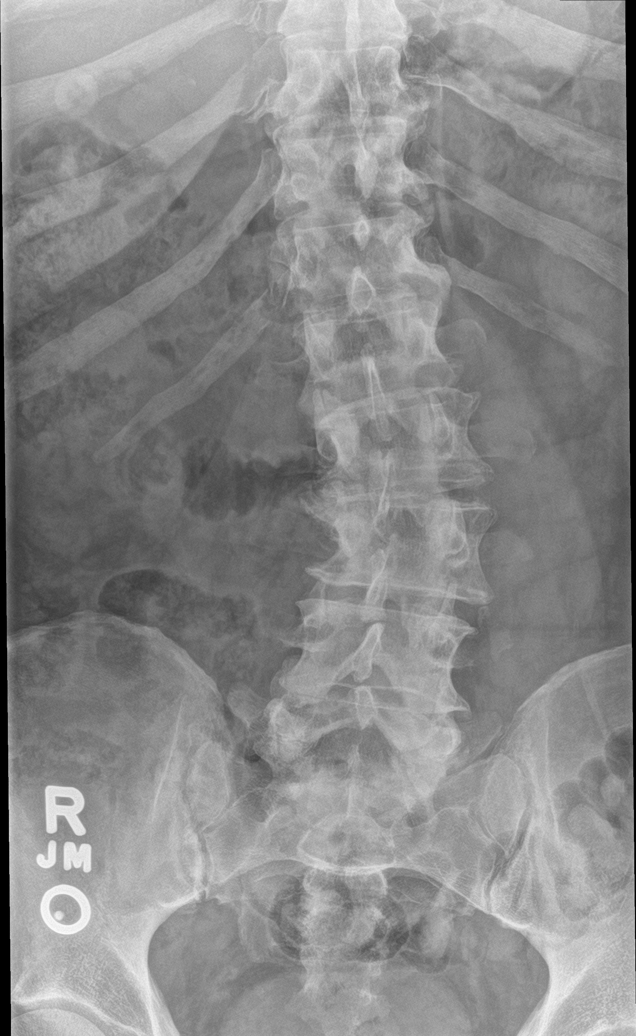

[l-spine obl (1 of 2)]
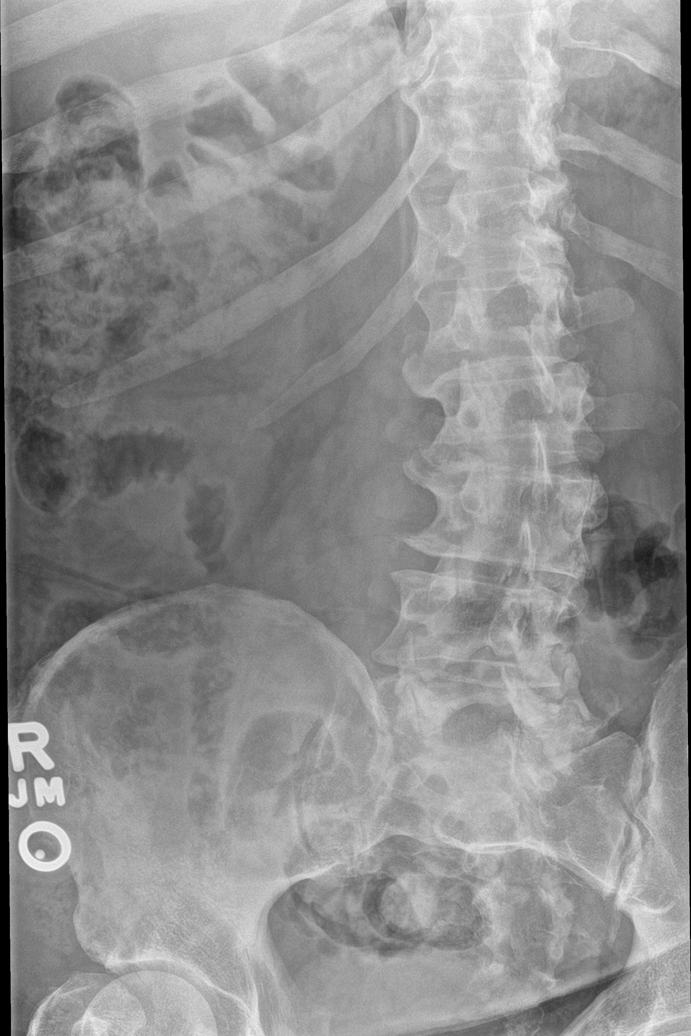

[l-spine obl (2 of 2)]
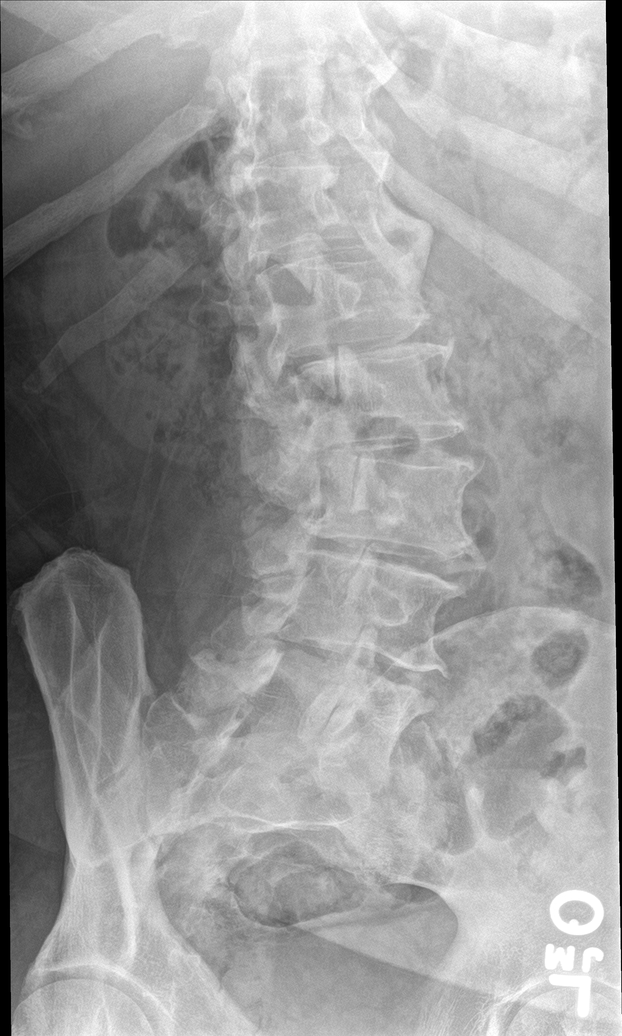

[l-spine lat]
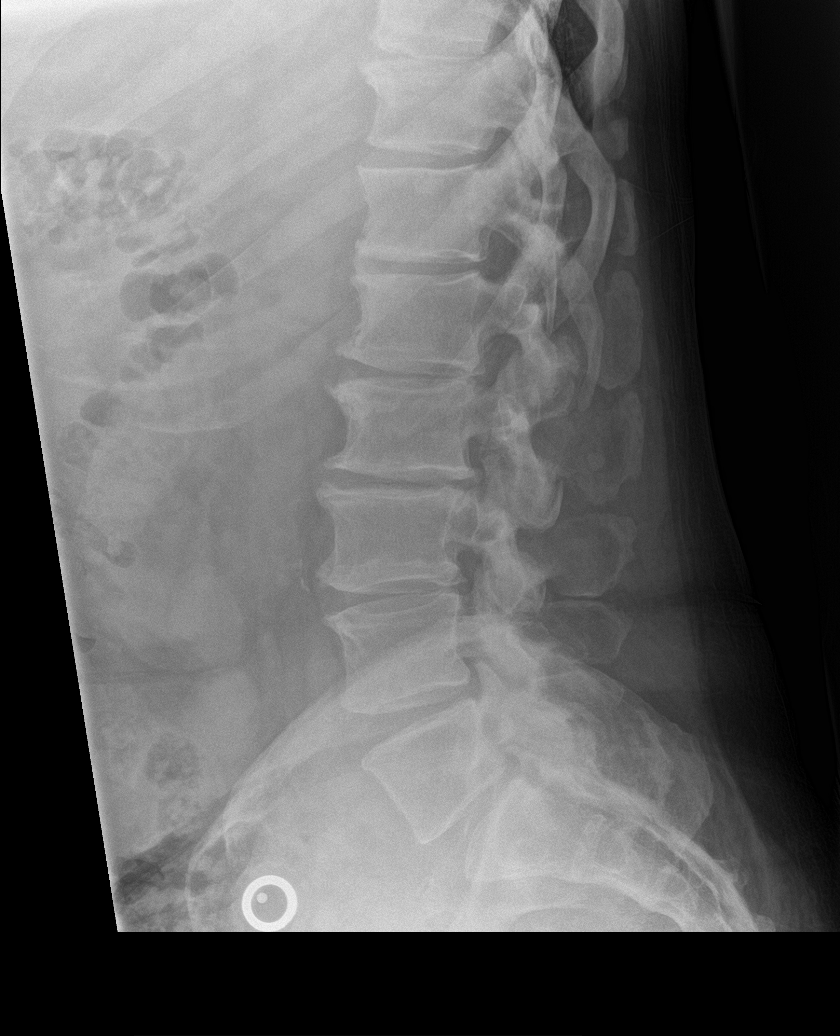

[l-spine spot]
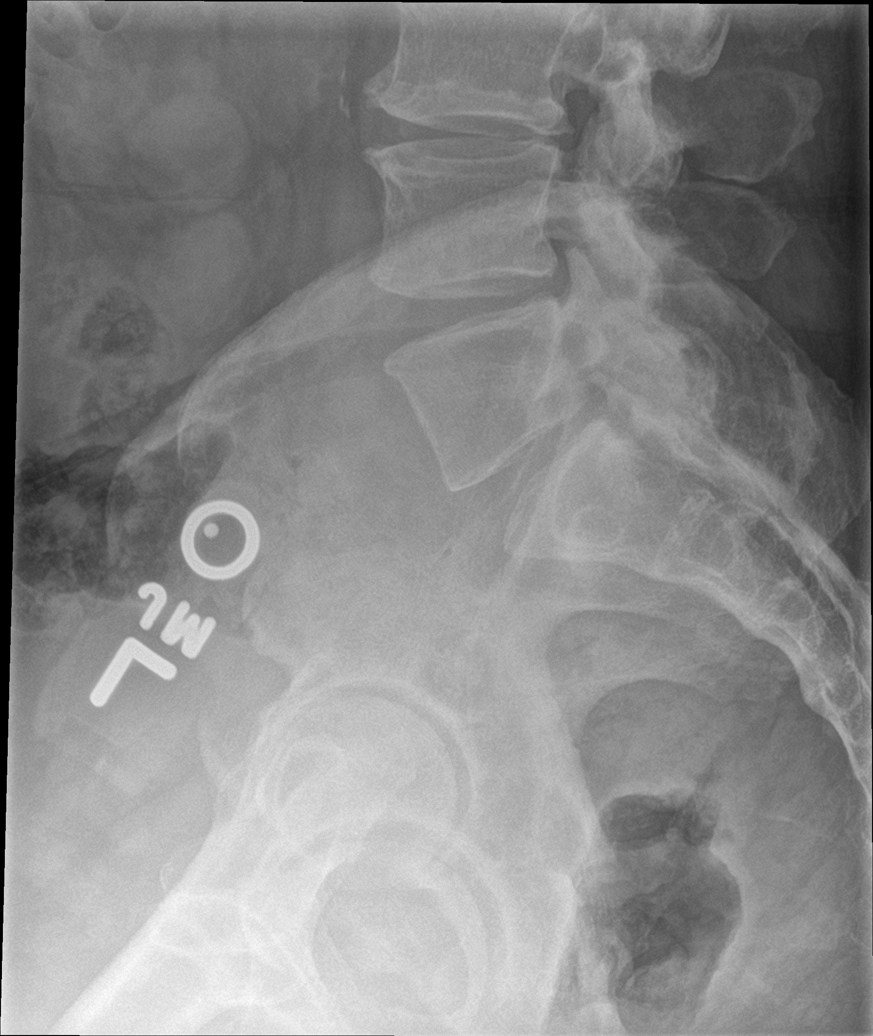

[5 of 5 positions shown; findings below may reference images not displayed]

FINDINGS: Moderate levoscoliosis of lumbar spine is noted. No fracture or
spondylolisthesis is noted. Moderate degenerative disc disease is
noted at L1-2, L2-3 and L3-4 with anterior osteophyte formation.
Mild degenerative changes seen involving posterior facet joints of
the lumbar spine.
IMPRESSION: Multilevel degenerative disc disease. No acute abnormality seen in
the lumbar spine.

## 2017-07-10 ENCOUNTER — Other Ambulatory Visit: Payer: Self-pay | Admitting: Family Medicine

## 2017-07-10 ENCOUNTER — Other Ambulatory Visit: Payer: Self-pay | Admitting: Sports Medicine

## 2017-07-10 DIAGNOSIS — E119 Type 2 diabetes mellitus without complications: Secondary | ICD-10-CM

## 2017-07-10 DIAGNOSIS — M5416 Radiculopathy, lumbar region: Secondary | ICD-10-CM

## 2017-07-10 NOTE — Telephone Encounter (Signed)
To PCP

## 2017-07-25 ENCOUNTER — Encounter: Payer: Self-pay | Admitting: Family Medicine

## 2017-07-25 ENCOUNTER — Ambulatory Visit (INDEPENDENT_AMBULATORY_CARE_PROVIDER_SITE_OTHER): Payer: BLUE CROSS/BLUE SHIELD | Admitting: Family Medicine

## 2017-07-25 VITALS — BP 130/85 | HR 81 | Ht 72.0 in | Wt 268.0 lb

## 2017-07-25 DIAGNOSIS — Z125 Encounter for screening for malignant neoplasm of prostate: Secondary | ICD-10-CM | POA: Diagnosis not present

## 2017-07-25 DIAGNOSIS — E119 Type 2 diabetes mellitus without complications: Secondary | ICD-10-CM | POA: Diagnosis not present

## 2017-07-25 DIAGNOSIS — E785 Hyperlipidemia, unspecified: Secondary | ICD-10-CM | POA: Diagnosis not present

## 2017-07-25 DIAGNOSIS — I10 Essential (primary) hypertension: Secondary | ICD-10-CM

## 2017-07-25 DIAGNOSIS — R5383 Other fatigue: Secondary | ICD-10-CM | POA: Diagnosis not present

## 2017-07-25 DIAGNOSIS — E1140 Type 2 diabetes mellitus with diabetic neuropathy, unspecified: Secondary | ICD-10-CM

## 2017-07-25 LAB — POCT GLYCOSYLATED HEMOGLOBIN (HGB A1C): HEMOGLOBIN A1C: 7.5

## 2017-07-25 MED ORDER — AMBULATORY NON FORMULARY MEDICATION: 0 refills | Status: DC

## 2017-07-25 NOTE — Progress Notes (Signed)
Antonio Rivera is a 61 y.o. male who presents to [facility] today for follow up diabetes, HTN, HLD, fatigue.   Antonio Rivera has type 2 diabetes that is typically pretty well controlled with Januvia and Synjardy.  He denies any hyperglycemia polyuria or polydipsia.  He notes that he is gained some weight and thinks his diabetes is going to be less well controlled than typical.  Hypertension: Antonio Rivera has hypertension which is controlled with lisinopril.  He denies chest pain or palpitations. He denies any light headed or dizzy.   HLD: Antonio Rivera tolerates Lipitor well. He denies any significant muscle aches or pain.   Fatigue: Antonio Rivera notes some fatigue.  He uses his CPAP machine with an auto titration setting.  He notes he typically feels very tired in the evening and is concerned that either the CPAP is not working right or he has low testosterone.  He does not note significant low libido.     Past Medical History:  Diagnosis Date  . Diabetes mellitus    type 2  . HNP (herniated nucleus pulposus)    C6--7  . Hyperlipidemia   . Hypertension   . OSA on CPAP 1999   Past Surgical History:  Procedure Laterality Date  . ruptured disk surgery  1999   C6--7  . TONSILLECTOMY  age 57   Social History   Tobacco Use  . Smoking status: Former Smoker    Years: 30.00    Types: Pipe  . Smokeless tobacco: Never Used  Substance Use Topics  . Alcohol use: Yes    Alcohol/week: 0.5 oz    Types: 1 Standard drinks or equivalent per week    Comment: per week   family history includes Cancer in his brother, mother, and sister; Cancer (age of onset: 66) in his father.  ROS as above:  Medications: Current Outpatient Medications  Medication Sig Dispense Refill  . albuterol (PROVENTIL HFA;VENTOLIN HFA) 108 (90 Base) MCG/ACT inhaler Inhale 1-2 puffs into the lungs every 6 (six) hours as  needed for wheezing or shortness of breath. 1 Inhaler 0  . AMBULATORY NON FORMULARY MEDICATION Glucometer of choice with test test strips, lancets and other supplies.  Test daily To use as directed Dx: E11.9 Type 2 Diabetes 1 each 0  . AMBULATORY NON FORMULARY MEDICATION CPAP mask and supplies as needed.  Also provide 30 day download.  Send to [phone] (fax) Dx: OSA 1 Units 0  . aspirin 81 MG EC tablet Take 81 mg by mouth as needed.      Marland Kitchen atorvastatin (LIPITOR) 20 MG tablet TAKE 1 TABLET (20 MG TOTAL) BY MOUTH DAILY. 90 tablet 1  . cetirizine (ZYRTEC) 10 MG tablet Take 1 tablet (10 mg total) by mouth daily. 60 tablet 0  . Fish Oil OIL by Does not apply route.    Marland Kitchen JANUVIA 100 MG tablet TAKE 1 TABLET (100 MG TOTAL) BY MOUTH DAILY. 90 tablet 0  . lisinopril (PRINIVIL,ZESTRIL) 10 MG tablet TAKE 1 TABLET (10 MG TOTAL) BY MOUTH DAILY. 90 tablet 0  . meloxicam (MOBIC) 15 MG tablet TAKE 1 TABLET IN THE MORNING WITH BREAKFAST FOR 2 WEEKS THEN DAILY AS NEEDED FOR PAIN 30 tablet 3  . Multiple Vitamins-Minerals (MULTIVITAMIN PO) Take 1 tablet by mouth every morning.    . pantoprazole (PROTONIX) 40 MG tablet Take by mouth.    Marland Kitchen POTASSIUM PO Take by mouth.    . Red Yeast  Rice 600 MG CAPS 2 caps every evening 2 capsule 1  . SYNJARDY 12.12-998 MG TABS TAKE 1 TABLET BY MOUTH 2 (TWO) TIMES DAILY. 60 tablet 3   No current facility-administered medications for this visit.    Allergies  Allergen Reactions  . Oysters [Shellfish Allergy] Nausea And Vomiting  . Prednisone Anxiety    Health Maintenance Health Maintenance  Topic Date Due  . FOOT EXAM  07/17/2017  . HEMOGLOBIN A1C  10/22/2017  . OPHTHALMOLOGY EXAM  03/05/2018  . PNEUMOCOCCAL POLYSACCHARIDE VACCINE (2) 04/02/2018  . COLONOSCOPY  04/23/2023  . TETANUS/TDAP  07/05/2024  . INFLUENZA VACCINE  Completed  . Hepatitis C Screening  Completed  . HIV Screening  Completed     Exam:  BP 130/85   Pulse 81   Ht 6' (1.829 m)   Wt 268 lb  (121.6 kg)   BMI 36.35 kg/m  Gen: Well NAD HEENT: EOMI,  MMM Lungs: Normal work of breathing. CTABL Heart: RRR no MRG Abd: NABS, Soft. Nondistended, Nontender Exts: Brisk capillary refill, warm and well perfused.  Diabetic Foot Exam - Simple   Simple Foot Form Diabetic Foot exam was performed with the following findings:  Yes 07/25/2017  7:35 AM  Visual Inspection No deformities, no ulcerations, no other skin breakdown bilaterally:  Yes Sensation Testing See comments:  Yes Pulse Check Posterior Tibialis and Dorsalis pulse intact bilaterally:  Yes Comments Decrease medial foot sensation BL       Results for orders placed or performed in visit on 07/25/17 (from the past 72 hour(s))  POCT HgB A1C     Status: None   Collection Time: 07/25/17  7:17 AM  Result Value Ref Range   Hemoglobin A1C 7.5    No results found.    Assessment and Plan: 61 y.o. male with  Diabetes: A1c slightly increased today.  Plan to work on low carbohydrate diet and weight loss.  Will recheck in 3 months.  Hyperlipidemia: Doing well on Lipitor.  Will check fasting labs before next visit.  Hypertension doing well check metabolic panel before the next visit.  Fatigue: We will check testosterone levels but also will get a 30-day CPAP report.  Prostate cancer screening: We will check PSA before the next visit.   Orders Placed This Encounter  Procedures  . CBC  . COMPLETE METABOLIC PANEL WITH GFR  . Lipid Panel w/reflex Direct LDL  . Hemoglobin A1c  . PSA  . Testosterone  . POCT HgB A1C   Meds ordered this encounter  Medications  . AMBULATORY NON FORMULARY MEDICATION    Sig: CPAP mask and supplies as needed.  Also provide 30 day download.  Send to [phone] (fax) Dx: OSA    Dispense:  1 Units    Refill:  0     Discussed warning signs or symptoms. Please see discharge instructions. Patient expresses understanding.

## 2017-07-25 NOTE — Patient Instructions (Addendum)
Thank you for coming in today. Call or go to the emergency room if you get worse, have trouble breathing, have chest pains, or palpitations.  Work on reduced carbs and weight loss.  We will recheck in 3 months.  If we can remember lets get fasting labs about 1 week before the next visit.  Get labs at last 3 months from today.

## 2017-08-14 ENCOUNTER — Encounter: Payer: Self-pay | Admitting: Emergency Medicine

## 2017-08-14 ENCOUNTER — Other Ambulatory Visit: Payer: Self-pay

## 2017-08-14 ENCOUNTER — Emergency Department
Admission: EM | Admit: 2017-08-14 | Discharge: 2017-08-14 | Disposition: A | Discharge status: Home / Self Care | Payer: BLUE CROSS/BLUE SHIELD | Source: Home / Self Care | Attending: Family Medicine | Admitting: Family Medicine

## 2017-08-14 DIAGNOSIS — B9789 Other viral agents as the cause of diseases classified elsewhere: Secondary | ICD-10-CM

## 2017-08-14 DIAGNOSIS — J069 Acute upper respiratory infection, unspecified: Secondary | ICD-10-CM | POA: Diagnosis not present

## 2017-08-14 MED ORDER — DOXYCYCLINE HYCLATE 100 MG PO CAPS: 100.0000 mg | ORAL_CAPSULE | Freq: Two times a day (BID) | ORAL | 0 refills | Status: DC

## 2017-08-14 NOTE — ED Triage Notes (Signed)
Sinus pain, pressure behind eyes, sneezing, cough, sore throat x 2 days

## 2017-08-14 NOTE — ED Provider Notes (Signed)
[name]    CSN: [id number] Arrival date & time: 08/14/17  1707     History   Chief Complaint Chief Complaint  Patient presents with  . Sinus Problem    HPI Antonio Rivera is a 62 y.o. male.   Patient complains of four day history of typical cold-like symptoms developing over several days, including mild sore throat, sinus congestion, fatigue, and cough.  He has developed myalgias today.  He has a history of recurring bronchitis.   The history is provided by the patient.    Past Medical History:  Diagnosis Date  . Diabetes mellitus    type 2  . HNP (herniated nucleus pulposus)    C6--7  . Hyperlipidemia   . Hypertension   . OSA on CPAP 1999    Patient Active Problem List   Diagnosis Date Noted  . GERD with esophagitis 07/17/2016  . Dermatofibroma 12/29/2014  . Constipation 09/30/2014  . Muscle cramp 03/24/2014  . Allergic rhinitis 10/06/2013  . Osteoarthritis of both knees 09/08/2013  . Personal history of colonic polyps 04/23/2013  . Type 2 diabetes mellitus with diabetic neuropathy, unspecified ([place]) 10/01/2012  . Hyperlipidemia LDL goal <100 07/03/2012  . Family history of prostate cancer 07/03/2012  . Essential hypertension, benign 08/16/2009  . Morbid obesity ([place]) 11/09/2008  . ASTHMA, INTERMITTENT, MILD 11/09/2008  . SLEEP APNEA 10/30/2008    Past Surgical History:  Procedure Laterality Date  . ruptured disk surgery  1999   C6--7  . TONSILLECTOMY  age 24       Home Medications    Prior to Admission medications   Medication Sig Start Date End Date Taking? Authorizing Provider  albuterol (PROVENTIL HFA;VENTOLIN HFA) 108 (90 Base) MCG/ACT inhaler Inhale 1-2 puffs into the lungs every 6 (six) hours as needed for wheezing or shortness of breath. 06/15/16   Noe Gens, PA-C  AMBULATORY NON FORMULARY MEDICATION Glucometer of choice with test test strips, lancets and other supplies.  Test daily To use as directed Dx: E11.9 Type 2  Diabetes 10/16/16   Gregor Hams, MD  AMBULATORY NON FORMULARY MEDICATION CPAP mask and supplies as needed.  Also provide 30 day download.  Send to [phone] (fax) Dx: OSA 07/25/17   Gregor Hams, MD  aspirin 81 MG EC tablet Take 81 mg by mouth as needed.      [provider]  atorvastatin (LIPITOR) 20 MG tablet TAKE 1 TABLET (20 MG TOTAL) BY MOUTH DAILY. 04/02/17   Gregor Hams, MD  cetirizine (ZYRTEC) 10 MG tablet Take 1 tablet (10 mg total) by mouth daily. 10/06/13   Marcial Pacas, DO  doxycycline (VIBRAMYCIN) 100 MG capsule Take 1 capsule (100 mg total) by mouth 2 (two) times daily. Take with food. 08/14/17   Kandra Nicolas, MD  Fish Oil OIL by Does not apply route.    [provider]  JANUVIA 100 MG tablet TAKE 1 TABLET (100 MG TOTAL) BY MOUTH DAILY. 07/10/17   Gregor Hams, MD  lisinopril (PRINIVIL,ZESTRIL) 10 MG tablet TAKE 1 TABLET (10 MG TOTAL) BY MOUTH DAILY. 06/05/17   Gregor Hams, MD  meloxicam (MOBIC) 15 MG tablet TAKE 1 TABLET IN THE MORNING WITH BREAKFAST FOR 2 WEEKS THEN DAILY AS NEEDED FOR PAIN 07/10/17   Gregor Hams, MD  Multiple Vitamins-Minerals (MULTIVITAMIN PO) Take 1 tablet by mouth every morning.    [provider]  pantoprazole (PROTONIX) 40 MG tablet Take by mouth. 07/24/16  [provider]  POTASSIUM PO Take by mouth.    [provider]  Red Yeast Rice 600 MG CAPS 2 caps every evening 04/02/13   Hommel, Sean, DO  SYNJARDY 12.12-998 MG TABS TAKE 1 TABLET BY MOUTH 2 (TWO) TIMES DAILY. 07/10/17   Gregor Hams, MD    Family History Family History  Problem Relation Age of Onset  . Cancer Mother        skin  . Cancer Father 40       prostate/died age 43   . Cancer Sister        ovarian cancer  . Cancer Brother        skin cancer    Social History Social History   Tobacco Use  . Smoking status: Former Smoker    Years: 30.00    Types: Pipe  . Smokeless tobacco: Never Used  Substance Use Topics  . Alcohol  use: Yes    Alcohol/week: 0.5 oz    Types: 1 Standard drinks or equivalent per week    Comment: per week  . Drug use: No     Allergies   Oysters [shellfish allergy] and Prednisone   Review of Systems Review of Systems + sore throat + cough + sneezing No pleuritic pain No wheezing + nasal congestion + post-nasal drainage + sinus pain/pressure No itchy/red eyes No earache No hemoptysis No SOB No fever/chills No nausea No vomiting No abdominal pain No diarrhea No urinary symptoms No skin rash + fatigue No myalgias No headache Used OTC meds without relief   Physical Exam Triage Vital Signs ED Triage Vitals [08/14/17 1832]  Enc Vitals Group     BP (!) 152/93     Pulse Rate 90     Resp      Temp 99 F (37.2 C)     Temp Source Oral     SpO2 98 %     Weight 269 lb (122 kg)     Height 6' (1.829 m)     Head Circumference      Peak Flow      Pain Score 3     Pain Loc      Pain Edu?      Excl. in [place]?    No data found.  Updated Vital Signs BP (!) 152/93 (BP Location: Right Arm)   Pulse 90   Temp 99 F (37.2 C) (Oral)   Ht 6' (1.829 m)   Wt 269 lb (122 kg)   SpO2 98%   BMI 36.48 kg/m   Visual Acuity Right Eye Distance:   Left Eye Distance:   Bilateral Distance:    Right Eye Near:   Left Eye Near:    Bilateral Near:     Physical Exam Nursing notes and Vital Signs reviewed. Appearance:  Patient appears stated age, and in no acute distress Eyes:  Pupils are equal, round, and reactive to light and accomodation.  Extraocular movement is intact.  Conjunctivae are not inflamed  Ears:  Canals normal.  Tympanic membranes normal.  Nose:  Mildly congested turbinates.  No sinus tenderness.  Pharynx:  Normal Neck:  Supple.  Enlarged posterior/lateral nodes are palpated bilaterally, tender to palpation on the left.   Lungs:  Clear to auscultation.  Breath sounds are equal.  Moving air well. Heart:  Regular rate and rhythm without murmurs, rubs, or gallops.   Abdomen:  Nontender without masses or hepatosplenomegaly.  Bowel sounds are present.  No CVA or flank tenderness.  Extremities:  No edema.  Skin:  No rash present.    UC Treatments / Results  Labs (all labs ordered are listed, but only abnormal results are displayed) Labs Reviewed - No data to display  EKG  EKG Interpretation None       Radiology No results found.  Procedures Procedures (including critical care time)  Medications Ordered in UC Medications - No data to display   Initial Impression / Assessment and Plan / UC Course  I have reviewed the triage vital signs and the nursing notes.  Pertinent labs & imaging results that were available during my care of the patient were reviewed by me and considered in my medical decision making (see chart for details).    Begin empiric doxycycline for atypical coverage. Take plain guaifenesin (1200mg  extended release tabs such as Mucinex) twice daily, with plenty of water, for cough and congestion.  Get adequate rest.   May use Afrin nasal spray (or generic oxymetazoline) each morning for about 5 days and then discontinue.  Also recommend using saline nasal spray several times daily and saline nasal irrigation (AYR is a common brand).    Try warm salt water gargles for sore throat.  Stop all antihistamines for now (Zyrtec, Nyquil, etc.) and other non-prescription cough/cold preparations. May take Delsym Cough Suppressant at bedtime for nighttime cough.  Followup with Family Doctor if not improved in 7 to 10 days.    Final Clinical Impressions(s) / UC Diagnoses   Final diagnoses:  Viral URI with cough    ED Discharge Orders        Ordered    doxycycline (VIBRAMYCIN) 100 MG capsule  2 times daily     08/14/17 1857          Kandra Nicolas, MD 08/19/17 1202

## 2017-08-14 NOTE — Discharge Instructions (Signed)
Take plain guaifenesin (1200mg  extended release tabs such as Mucinex) twice daily, with plenty of water, for cough and congestion.  Get adequate rest.   May use Afrin nasal spray (or generic oxymetazoline) each morning for about 5 days and then discontinue.  Also recommend using saline nasal spray several times daily and saline nasal irrigation (AYR is a common brand).    Try warm salt water gargles for sore throat.  Stop all antihistamines for now (Zyrtec, Nyquil, etc.) and other non-prescription cough/cold preparations. May take Delsym Cough Suppressant at bedtime for nighttime cough.

## 2017-09-04 DIAGNOSIS — K3189 Other diseases of stomach and duodenum: Secondary | ICD-10-CM | POA: Diagnosis not present

## 2017-09-04 DIAGNOSIS — K228 Other specified diseases of esophagus: Secondary | ICD-10-CM | POA: Diagnosis not present

## 2017-09-08 ENCOUNTER — Other Ambulatory Visit: Payer: Self-pay | Admitting: Family Medicine

## 2017-09-08 DIAGNOSIS — I10 Essential (primary) hypertension: Secondary | ICD-10-CM

## 2017-10-12 ENCOUNTER — Other Ambulatory Visit: Payer: Self-pay | Admitting: Family Medicine

## 2017-10-12 DIAGNOSIS — E119 Type 2 diabetes mellitus without complications: Secondary | ICD-10-CM

## 2017-11-07 ENCOUNTER — Ambulatory Visit: Payer: BLUE CROSS/BLUE SHIELD | Admitting: Family Medicine

## 2017-11-07 ENCOUNTER — Encounter: Payer: Self-pay | Admitting: Family Medicine

## 2017-11-07 VITALS — BP 127/69 | HR 73 | Wt 267.0 lb

## 2017-11-07 DIAGNOSIS — E785 Hyperlipidemia, unspecified: Secondary | ICD-10-CM

## 2017-11-07 DIAGNOSIS — Z794 Long term (current) use of insulin: Secondary | ICD-10-CM

## 2017-11-07 DIAGNOSIS — E1140 Type 2 diabetes mellitus with diabetic neuropathy, unspecified: Secondary | ICD-10-CM | POA: Diagnosis not present

## 2017-11-07 DIAGNOSIS — Z125 Encounter for screening for malignant neoplasm of prostate: Secondary | ICD-10-CM | POA: Diagnosis not present

## 2017-11-07 DIAGNOSIS — R6882 Decreased libido: Secondary | ICD-10-CM | POA: Diagnosis not present

## 2017-11-07 DIAGNOSIS — I10 Essential (primary) hypertension: Secondary | ICD-10-CM | POA: Diagnosis not present

## 2017-11-07 DIAGNOSIS — K5900 Constipation, unspecified: Secondary | ICD-10-CM | POA: Diagnosis not present

## 2017-11-07 LAB — POCT GLYCOSYLATED HEMOGLOBIN (HGB A1C): Hemoglobin A1C: 7.6

## 2017-11-07 NOTE — Progress Notes (Signed)
FREDIS MALKIEWICZ is a 62 y.o. male who presents to [facility] today for follow-up diabetes, hypertension, hyperlipidemia, obesity, and constipation.   Diabetes: Melven is doing well taking Januvia, and Synjardy for diabetes.  He notes his fasting blood sugars are typically in the 120s or 130s.  He does not typically check postprandial sugars.  He denies any low blood sugar episodes.  He denies any polyuria or polydipsia chest pain palpitations and feels well.  Hypertension: Alva takes lisinopril daily with no chest pain lightheadedness dizziness or headaches.  No significant cough.  Hyperlipidemia: Lavoris takes atorvastatin and baby aspirin daily.  He tolerates these medications well with no muscle aches or pains.  Obesity: Ongoing for years.  He is trying to eat a lower carbohydrate healthy diet.  He is trying to exercise as well.   Past Medical History:  Diagnosis Date  . Diabetes mellitus    type 2  . HNP (herniated nucleus pulposus)    C6--7  . Hyperlipidemia   . Hypertension   . OSA on CPAP 1999   Past Surgical History:  Procedure Laterality Date  . ruptured disk surgery  1999   C6--7  . TONSILLECTOMY  age 32   Social History   Tobacco Use  . Smoking status: Former Smoker    Years: 30.00    Types: Pipe  . Smokeless tobacco: Never Used  Substance Use Topics  . Alcohol use: Yes    Alcohol/week: 0.5 oz    Types: 1 Standard drinks or equivalent per week    Comment: per week   family history includes Cancer in his brother, mother, and sister; Cancer (age of onset: 48) in his father.  ROS as above:  Medications: Current Outpatient Medications  Medication Sig Dispense Refill  . albuterol (PROVENTIL HFA;VENTOLIN HFA) 108 (90 Base) MCG/ACT inhaler Inhale 1-2 puffs into the lungs every 6 (six) hours as needed for wheezing or shortness of breath. 1  Inhaler 0  . AMBULATORY NON FORMULARY MEDICATION Glucometer of choice with test test strips, lancets and other supplies.  Test daily To use as directed Dx: E11.9 Type 2 Diabetes 1 each 0  . AMBULATORY NON FORMULARY MEDICATION CPAP mask and supplies as needed.  Also provide 30 day download.  Send to [phone] (fax) Dx: OSA 1 Units 0  . aspirin 81 MG EC tablet Take 81 mg by mouth as needed.      Marland Kitchen atorvastatin (LIPITOR) 20 MG tablet TAKE 1 TABLET (20 MG TOTAL) BY MOUTH DAILY. 90 tablet 1  . cetirizine (ZYRTEC) 10 MG tablet Take 1 tablet (10 mg total) by mouth daily. 60 tablet 0  . Fish Oil OIL by Does not apply route.    Marland Kitchen JANUVIA 100 MG tablet TAKE 1 TABLET (100 MG TOTAL) BY MOUTH DAILY. 90 tablet 0  . lisinopril (PRINIVIL,ZESTRIL) 10 MG tablet TAKE 1 TABLET (10 MG TOTAL) BY MOUTH DAILY. 90 tablet 0  . Multiple Vitamins-Minerals (MULTIVITAMIN PO) Take 1 tablet by mouth every morning.    . pantoprazole (PROTONIX) 40 MG tablet Take by mouth.    Marland Kitchen POTASSIUM PO Take by mouth.    . Red Yeast Rice 600 MG CAPS 2 caps every evening 2 capsule 1  . SYNJARDY 12.12-998 MG TABS TAKE 1 TABLET BY MOUTH 2 (TWO) TIMES DAILY. 60 tablet 3   No current facility-administered medications for this visit.    Allergies  Allergen Reactions  . Oysters [  Shellfish Allergy] Nausea And Vomiting  . Prednisone Anxiety    Health Maintenance Health Maintenance  Topic Date Due  . HEMOGLOBIN A1C  01/23/2018  . OPHTHALMOLOGY EXAM  03/05/2018  . INFLUENZA VACCINE  03/07/2018  . PNEUMOCOCCAL POLYSACCHARIDE VACCINE (2) 04/02/2018  . COLONOSCOPY  04/22/2018  . FOOT EXAM  07/25/2018  . TETANUS/TDAP  07/05/2024  . Hepatitis C Screening  Completed  . HIV Screening  Completed     Exam:  BP 127/69   Pulse 73   Wt 267 lb (121.1 kg)   SpO2 99%   BMI 36.21 kg/m   Wt Readings from Last 5 Encounters:  11/07/17 267 lb (121.1 kg)  08/14/17 269 lb (122 kg)  07/25/17 268 lb (121.6 kg)  04/24/17 267 lb (121.1 kg)    01/16/17 265 lb (120.2 kg)    Gen: Well NAD obese HEENT: EOMI,  MMM Lungs: Normal work of breathing. CTABL Heart: RRR no MRG Abd: NABS, Soft. Nondistended, Nontender Exts: Brisk capillary refill, warm and well perfused.    Results for orders placed or performed in visit on 11/07/17 (from the past 72 hour(s))  POCT HgB A1C     Status: Abnormal   Collection Time: 11/07/17  7:38 AM  Result Value Ref Range   Hemoglobin A1C 7.6    No results found.    Assessment and Plan: 62 y.o. male with  Diabetes: A1c a bit above goal.  Discussed options.  Plan to work hard on lower carbohydrate diet increase activity.  Recheck in 3 months.  Next step would be GLP-1 agonists  Hypertension: Blood pressure at goal continue current regimen.  Check metabolic panel.  Hyperlipidemia: Doing well.  Check lipids along with metabolic panel.  Morbid obesity: Cofactors are hypertension and diabetes.  Work on lifestyle management discussed above.  Check PSA for prostate cancer screening.  Check testosterone as well given history of low libido in the past.   Orders Placed This Encounter  Procedures  . CBC  . COMPLETE METABOLIC PANEL WITH GFR  . Lipid Panel w/reflex Direct LDL  . Testosterone  . PSA  . POCT HgB A1C   No orders of the defined types were placed in this encounter.    Discussed warning signs or symptoms. Please see discharge instructions. Patient expresses understanding.

## 2017-11-07 NOTE — Patient Instructions (Addendum)
Thank you for coming in today. Try to do less than 40g of carbs per meal.  Check sugars three time daily after meals.  Send the results to me in 1 month.  Sign up for mychart.   Get labs today.   Recheck in 3 months.

## 2017-11-08 ENCOUNTER — Encounter: Payer: Self-pay | Admitting: Family Medicine

## 2017-11-08 DIAGNOSIS — R972 Elevated prostate specific antigen [PSA]: Secondary | ICD-10-CM | POA: Insufficient documentation

## 2017-11-08 LAB — LIPID PANEL W/REFLEX DIRECT LDL
CHOL/HDL RATIO: 2.8 (calc) (ref <5.0)
CHOLESTEROL: 145 mg/dL (ref <200)
HDL: 52 mg/dL (ref >40)
LDL CHOLESTEROL (CALC): 72 mg/dL
Non-HDL Cholesterol (Calc): 93 mg/dL (calc) (ref <130)
TRIGLYCERIDES: 124 mg/dL (ref <150)

## 2017-11-08 LAB — CBC
HCT: 42.4 % (ref 38.5–50.0)
HEMOGLOBIN: 13.8 g/dL (ref 13.2–17.1)
MCH: 26 pg — AB (ref 27.0–33.0)
MCHC: 32.5 g/dL (ref 32.0–36.0)
MCV: 80 fL (ref 80.0–100.0)
MPV: 10.5 fL (ref 7.5–12.5)
PLATELETS: 250 10*3/uL (ref 140–400)
RBC: 5.3 10*6/uL (ref 4.20–5.80)
RDW: 15.2 % — ABNORMAL HIGH (ref 11.0–15.0)
WBC: 6 10*3/uL (ref 3.8–10.8)

## 2017-11-08 LAB — COMPLETE METABOLIC PANEL WITH GFR
AG Ratio: 1.8 (calc) (ref 1.0–2.5)
ALBUMIN MSPROF: 4.4 g/dL (ref 3.6–5.1)
ALKALINE PHOSPHATASE (APISO): 60 U/L (ref 40–115)
ALT: 31 U/L (ref 9–46)
AST: 24 U/L (ref 10–35)
BILIRUBIN TOTAL: 0.6 mg/dL (ref 0.2–1.2)
BUN: 14 mg/dL (ref 7–25)
CHLORIDE: 107 mmol/L (ref 98–110)
CO2: 26 mmol/L (ref 20–32)
CREATININE: 0.88 mg/dL (ref 0.70–1.25)
Calcium: 9.2 mg/dL (ref 8.6–10.3)
GFR, EST AFRICAN AMERICAN: 107 mL/min/{1.73_m2} (ref >=60)
GFR, Est Non African American: 92 mL/min/{1.73_m2} (ref >=60)
GLUCOSE: 158 mg/dL — AB (ref 65–99)
Globulin: 2.4 g/dL (calc) (ref 1.9–3.7)
Potassium: 4.7 mmol/L (ref 3.5–5.3)
Sodium: 140 mmol/L (ref 135–146)
TOTAL PROTEIN: 6.8 g/dL (ref 6.1–8.1)

## 2017-11-08 LAB — PSA: PSA: 1.2 ng/mL (ref <=4.0)

## 2017-11-08 LAB — TESTOSTERONE: Testosterone: 316 ng/dL (ref 250–827)

## 2017-11-11 ENCOUNTER — Other Ambulatory Visit: Payer: Self-pay | Admitting: Family Medicine

## 2017-11-11 DIAGNOSIS — E119 Type 2 diabetes mellitus without complications: Secondary | ICD-10-CM

## 2017-11-11 DIAGNOSIS — M5416 Radiculopathy, lumbar region: Secondary | ICD-10-CM

## 2017-12-12 ENCOUNTER — Other Ambulatory Visit: Payer: Self-pay | Admitting: Family Medicine

## 2017-12-12 DIAGNOSIS — I10 Essential (primary) hypertension: Secondary | ICD-10-CM

## 2018-01-19 ENCOUNTER — Other Ambulatory Visit: Payer: Self-pay | Admitting: Family Medicine

## 2018-01-19 DIAGNOSIS — E119 Type 2 diabetes mellitus without complications: Secondary | ICD-10-CM

## 2018-01-31 ENCOUNTER — Telehealth: Payer: Self-pay | Admitting: Family Medicine

## 2018-01-31 ENCOUNTER — Encounter: Payer: Self-pay | Admitting: Family Medicine

## 2018-01-31 NOTE — Telephone Encounter (Signed)
Spoke with Pt, he is taking Rx daily. States he just picked up a refill from the pharmacy. No further questions.

## 2018-01-31 NOTE — Telephone Encounter (Signed)
I received a letter from your insurance company.  They noticed that you have not been refilling your atorvastatin frequently.  If you are having trouble getting this medicine or having trouble affording it are not tolerating it please let me know.  We have strong studies to show that taking medicines like atorvastatin reduces your risk of death when you have diabetes.  Antonio Rivera

## 2018-02-13 ENCOUNTER — Ambulatory Visit: Payer: BLUE CROSS/BLUE SHIELD | Admitting: Family Medicine

## 2018-02-13 ENCOUNTER — Ambulatory Visit (INDEPENDENT_AMBULATORY_CARE_PROVIDER_SITE_OTHER): Payer: BLUE CROSS/BLUE SHIELD | Admitting: Family Medicine

## 2018-02-13 ENCOUNTER — Encounter: Payer: Self-pay | Admitting: Family Medicine

## 2018-02-13 VITALS — BP 117/65 | HR 64 | Ht 72.0 in | Wt 254.0 lb

## 2018-02-13 DIAGNOSIS — R972 Elevated prostate specific antigen [PSA]: Secondary | ICD-10-CM

## 2018-02-13 DIAGNOSIS — E669 Obesity, unspecified: Secondary | ICD-10-CM | POA: Diagnosis not present

## 2018-02-13 DIAGNOSIS — I10 Essential (primary) hypertension: Secondary | ICD-10-CM | POA: Diagnosis not present

## 2018-02-13 DIAGNOSIS — E785 Hyperlipidemia, unspecified: Secondary | ICD-10-CM

## 2018-02-13 DIAGNOSIS — E1140 Type 2 diabetes mellitus with diabetic neuropathy, unspecified: Secondary | ICD-10-CM | POA: Diagnosis not present

## 2018-02-13 DIAGNOSIS — Z794 Long term (current) use of insulin: Secondary | ICD-10-CM

## 2018-02-13 LAB — POCT GLYCOSYLATED HEMOGLOBIN (HGB A1C): Hemoglobin A1C: 6.9 % — AB (ref 4.0–5.6)

## 2018-02-13 MED ORDER — AMBULATORY NON FORMULARY MEDICATION: 0 refills | Status: DC

## 2018-02-13 MED ORDER — ATORVASTATIN CALCIUM 20 MG PO TABS: 20.0000 mg | ORAL_TABLET | Freq: Every day | ORAL | 1 refills | Status: DC

## 2018-02-13 NOTE — Patient Instructions (Addendum)
Thank you for coming in today.  You are doing great.  Continue the careful diet.  This will continue to result in weight loss and better diabetes control.  If it continues I think we will be able to stop 1 of the diabetes medicines.  Recheck in 3 months.

## 2018-02-13 NOTE — Progress Notes (Signed)
Antonio Rivera is a 62 y.o. male who presents to [place]: [place] today for follow-up diabetes hypertension hyperlipidemia elevated PSA and obesity.  Antonio Rivera was seen 3 months ago.  At that time his diabetes was creeping up with an A1c of 7.6.  He was asked to decrease his carbohydrates in his diet and has done so.  He continues to take medications listed below.  He notes his blood sugars typically from 110-140 fasting.  He feels like he is doing quite well.  He is lost some weight with the improved diet.  He denies polyuria or polydipsia.  Hypertension: Very takes medications listed below.  No chest pain palpitations shortness of breath lightheadedness or dizziness.  He does note some mild muscle cramping but that is been ongoing his entire life and is not changed.  Elevated PSA: Logen's PSA went from 0.6 last year to 1.2 in April.  He feels well and does not have any urinary symptoms.  He notes his dad had aggressive prostate cancer.   ROS as above:  Exam:  BP 117/65   Pulse 64   Ht 6' (1.829 m)   Wt 254 lb (115.2 kg)   BMI 34.45 kg/m   Wt Readings from Last 5 Encounters:  02/13/18 254 lb (115.2 kg)  11/07/17 267 lb (121.1 kg)  08/14/17 269 lb (122 kg)  07/25/17 268 lb (121.6 kg)  04/24/17 267 lb (121.1 kg)    Gen: Well NAD HEENT: EOMI,  MMM Lungs: Normal work of breathing. CTABL Heart: RRR no MRG Abd: NABS, Soft. Nondistended, Nontender Exts: Brisk capillary refill, warm and well perfused.   Lab and Radiology Results Lab Results  Component Value Date   PSA 1.2 11/07/2017   PSA 0.6 07/17/2016   PSA 0.93 03/31/2015   Lab Results  Component Value Date   HGBA1C 6.9 (A) 02/13/2018   Lab Results  Component Value Date   CHOL 145 11/07/2017   HDL 52 11/07/2017   LDLCALC 72 11/07/2017   TRIG 124 11/07/2017   CHOLHDL 2.8 11/07/2017       Assessment  and Plan: 62 y.o. male with  Diabetes significant improvement.  Continue current regimen and diet.  Recheck in 3 months.  Hypertension at goal continue current regimen.  Recheck 3 months.  Hyperlipidemia: Doing very well.  Continue weight loss.  Recheck 3 months.  Obesity: BMI now less than 35.  Patient has left the morbid obesity category.  This is excellent news.  Continue to work on improved diet.  Mildly elevated PSA: Recheck in 3 months (6 months since last check)   Orders Placed This Encounter  Procedures  . POCT HgB A1C   Meds ordered this encounter  Medications  . atorvastatin (LIPITOR) 20 MG tablet    Sig: Take 1 tablet (20 mg total) by mouth daily.    Dispense:  90 tablet    Refill:  1     Historical information moved to improve visibility of documentation.  Past Medical History:  Diagnosis Date  . Diabetes mellitus    type 2  . HNP (herniated nucleus pulposus)    C6--7  . Hyperlipidemia   . Hypertension   . OSA on CPAP 1999   Past Surgical History:  Procedure Laterality Date  . ruptured disk surgery  1999   C6--7  . TONSILLECTOMY  age 62   Social History   Tobacco Use  . Smoking status: Former Smoker  Years: 30.00    Types: Pipe  . Smokeless tobacco: Never Used  Substance Use Topics  . Alcohol use: Yes    Alcohol/week: 0.6 oz    Types: 1 Standard drinks or equivalent per week    Comment: per week   family history includes Cancer in his brother, mother, and sister; Cancer (age of onset: 60) in his father.  Medications: Current Outpatient Medications  Medication Sig Dispense Refill  . albuterol (PROVENTIL HFA;VENTOLIN HFA) 108 (90 Base) MCG/ACT inhaler Inhale 1-2 puffs into the lungs every 6 (six) hours as needed for wheezing or shortness of breath. 1 Inhaler 0  . AMBULATORY NON FORMULARY MEDICATION Glucometer of choice with test test strips, lancets and other supplies.  Test daily To use as directed Dx: E11.9 Type 2 Diabetes 1 each 0  .  AMBULATORY NON FORMULARY MEDICATION CPAP mask and supplies as needed.  Also provide 30 day download.  Send to [phone] (fax) Dx: OSA 1 Units 0  . aspirin 81 MG EC tablet Take 81 mg by mouth as needed.      Marland Kitchen atorvastatin (LIPITOR) 20 MG tablet Take 1 tablet (20 mg total) by mouth daily. 90 tablet 1  . cetirizine (ZYRTEC) 10 MG tablet Take 1 tablet (10 mg total) by mouth daily. 60 tablet 0  . Fish Oil OIL by Does not apply route.    Marland Kitchen JANUVIA 100 MG tablet TAKE 1 TABLET (100 MG TOTAL) BY MOUTH DAILY. 90 tablet 0  . lisinopril (PRINIVIL,ZESTRIL) 10 MG tablet TAKE 1 TABLET (10 MG TOTAL) BY MOUTH DAILY. 90 tablet 0  . meloxicam (MOBIC) 15 MG tablet TAKE 1 TABLET IN THE MORNING WITH BREAKFAST FOR 2 WEEKS THEN DAILY AS NEEDED FOR PAIN 30 tablet 3  . Multiple Vitamins-Minerals (MULTIVITAMIN PO) Take 1 tablet by mouth every morning.    . pantoprazole (PROTONIX) 40 MG tablet Take by mouth.    Marland Kitchen POTASSIUM PO Take by mouth.    . Red Yeast Rice 600 MG CAPS 2 caps every evening 2 capsule 1  . SYNJARDY 12.12-998 MG TABS TAKE 1 TABLET BY MOUTH 2 (TWO) TIMES DAILY. 60 tablet 3   No current facility-administered medications for this visit.    Allergies  Allergen Reactions  . Oysters [Shellfish Allergy] Nausea And Vomiting  . Prednisone Anxiety     Discussed warning signs or symptoms. Please see discharge instructions. Patient expresses understanding.

## 2018-03-24 ENCOUNTER — Other Ambulatory Visit: Payer: Self-pay | Admitting: Family Medicine

## 2018-03-24 DIAGNOSIS — I10 Essential (primary) hypertension: Secondary | ICD-10-CM

## 2018-03-24 DIAGNOSIS — E119 Type 2 diabetes mellitus without complications: Secondary | ICD-10-CM

## 2018-04-05 DIAGNOSIS — H5203 Hypermetropia, bilateral: Secondary | ICD-10-CM | POA: Diagnosis not present

## 2018-04-05 DIAGNOSIS — E119 Type 2 diabetes mellitus without complications: Secondary | ICD-10-CM | POA: Diagnosis not present

## 2018-04-05 DIAGNOSIS — Q143 Congenital malformation of choroid: Secondary | ICD-10-CM | POA: Diagnosis not present

## 2018-04-21 ENCOUNTER — Other Ambulatory Visit: Payer: Self-pay | Admitting: Family Medicine

## 2018-04-21 DIAGNOSIS — E119 Type 2 diabetes mellitus without complications: Secondary | ICD-10-CM

## 2018-04-26 DIAGNOSIS — D175 Benign lipomatous neoplasm of intra-abdominal organs: Secondary | ICD-10-CM | POA: Diagnosis not present

## 2018-04-26 DIAGNOSIS — D120 Benign neoplasm of cecum: Secondary | ICD-10-CM | POA: Diagnosis not present

## 2018-04-26 DIAGNOSIS — Z86010 Personal history of colonic polyps: Secondary | ICD-10-CM | POA: Diagnosis not present

## 2018-04-26 DIAGNOSIS — D123 Benign neoplasm of transverse colon: Secondary | ICD-10-CM | POA: Diagnosis not present

## 2018-04-26 DIAGNOSIS — K6389 Other specified diseases of intestine: Secondary | ICD-10-CM | POA: Diagnosis not present

## 2018-04-26 LAB — HM COLONOSCOPY

## 2018-05-09 ENCOUNTER — Telehealth: Payer: Self-pay | Admitting: Family Medicine

## 2018-05-09 NOTE — Telephone Encounter (Signed)
Colonoscopy report received.  Recheck in 5 years.  Note will be sent to abstract

## 2018-05-10 ENCOUNTER — Encounter: Payer: Self-pay | Admitting: Family Medicine

## 2018-05-16 ENCOUNTER — Encounter: Payer: Self-pay | Admitting: Family Medicine

## 2018-05-16 ENCOUNTER — Ambulatory Visit: Payer: BLUE CROSS/BLUE SHIELD | Admitting: Family Medicine

## 2018-05-16 VITALS — BP 104/61 | HR 58 | Ht 72.0 in | Wt 253.0 lb

## 2018-05-16 DIAGNOSIS — Z125 Encounter for screening for malignant neoplasm of prostate: Secondary | ICD-10-CM

## 2018-05-16 DIAGNOSIS — R972 Elevated prostate specific antigen [PSA]: Secondary | ICD-10-CM | POA: Diagnosis not present

## 2018-05-16 DIAGNOSIS — I10 Essential (primary) hypertension: Secondary | ICD-10-CM | POA: Diagnosis not present

## 2018-05-16 DIAGNOSIS — Z23 Encounter for immunization: Secondary | ICD-10-CM | POA: Diagnosis not present

## 2018-05-16 DIAGNOSIS — E1140 Type 2 diabetes mellitus with diabetic neuropathy, unspecified: Secondary | ICD-10-CM | POA: Diagnosis not present

## 2018-05-16 DIAGNOSIS — Z794 Long term (current) use of insulin: Secondary | ICD-10-CM | POA: Diagnosis not present

## 2018-05-16 LAB — POCT GLYCOSYLATED HEMOGLOBIN (HGB A1C): Hemoglobin A1C: 6.7 % — AB (ref 4.0–5.6)

## 2018-05-16 NOTE — Progress Notes (Signed)
Antonio Rivera is a 62 y.o. male who presents to [place]: [facility] today for diabetes hypertension and PSA.  Antonio Rivera continues to adhere to a lower carbohydrate diet and feels well.  He notes his blood sugars are typically in the 130s.  No hyper or hypoglycemic episodes.  He tolerates his medications well.  Hypertension:.  Takes medications listed below.  No chest pain palpitations or shortness of breath.  Elevated PSA level.  Patient had slight increase in his PSA since last check about 6 months ago.  He is due for recheck.  He remains asymptomatic with no significant urinary symptoms.   ROS as above:  Exam:  BP 104/61   Pulse (!) 58   Ht 6' (1.829 m)   Wt 253 lb (114.8 kg)   BMI 34.31 kg/m  Wt Readings from Last 5 Encounters:  05/16/18 253 lb (114.8 kg)  02/13/18 254 lb (115.2 kg)  11/07/17 267 lb (121.1 kg)  08/14/17 269 lb (122 kg)  07/25/17 268 lb (121.6 kg)    Gen: Well NAD HEENT: EOMI,  MMM Lungs: Normal work of breathing. CTABL Heart: RRR no MRG Abd: NABS, Soft. Nondistended, Nontender Exts: Brisk capillary refill, warm and well perfused.   Lab and Radiology Results Results for orders placed or performed in visit on 05/16/18 (from the past 72 hour(s))  POCT HgB A1C     Status: Abnormal   Collection Time: 05/16/18  8:18 AM  Result Value Ref Range   Hemoglobin A1C 6.7 (A) 4.0 - 5.6 %   HbA1c POC (<> result, manual entry)     HbA1c, POC (prediabetic range)     HbA1c, POC (controlled diabetic range)     No results found.    Assessment and Plan: 62 y.o. male with  Diabetes extremely well controlled continue current regimen recheck in 5 months.  Hypertension well-controlled continue current regimen and recheck in 5 months.  Elevated PSA velocity.  Recheck PSA for trend.  Recheck 5 months if all is well.  Flu vaccine given today prior to  discharge. Records request for diabetic eye exam.   Orders Placed This Encounter  Procedures  . Flu Vaccine QUAD 6+ mos PF IM (Fluarix Quad PF)  . PSA  . POCT HgB A1C   No orders of the defined types were placed in this encounter.    Historical information moved to improve visibility of documentation.  Past Medical History:  Diagnosis Date  . Diabetes mellitus    type 2  . HNP (herniated nucleus pulposus)    C6--7  . Hyperlipidemia   . Hypertension   . OSA on CPAP 1999   Past Surgical History:  Procedure Laterality Date  . ruptured disk surgery  1999   C6--7  . TONSILLECTOMY  age 89   Social History   Tobacco Use  . Smoking status: Former Smoker    Years: 30.00    Types: Pipe  . Smokeless tobacco: Never Used  Substance Use Topics  . Alcohol use: Yes    Alcohol/week: 1.0 standard drinks    Types: 1 Standard drinks or equivalent per week    Comment: per week   family history includes Cancer in his brother, mother, and sister; Cancer (age of onset: 34) in his father.  Medications: Current Outpatient Medications  Medication Sig Dispense Refill  . albuterol (PROVENTIL HFA;VENTOLIN HFA) 108 (90 Base) MCG/ACT inhaler Inhale 1-2 puffs into the lungs every 6 (six)  hours as needed for wheezing or shortness of breath. 1 Inhaler 0  . AMBULATORY NON FORMULARY MEDICATION Glucometer of choice with test test strips, lancets and other supplies.  Test daily To use as directed Dx: E11.9 Type 2 Diabetes 1 each 0  . AMBULATORY NON FORMULARY MEDICATION CPAP mask and supplies as needed.  Also provide 30 day download.  Send to [phone] (fax) Dx: OSA 1 Units 0  . aspirin 81 MG EC tablet Take 81 mg by mouth as needed.      Marland Kitchen atorvastatin (LIPITOR) 20 MG tablet Take 1 tablet (20 mg total) by mouth daily. 90 tablet 1  . cetirizine (ZYRTEC) 10 MG tablet Take 1 tablet (10 mg total) by mouth daily. 60 tablet 0  . Fish Oil OIL by Does not apply route.    Marland Kitchen JANUVIA 100 MG tablet TAKE  1 TABLET (100 MG TOTAL) BY MOUTH DAILY. 30 tablet 2  . lisinopril (PRINIVIL,ZESTRIL) 10 MG tablet TAKE 1 TABLET (10 MG TOTAL) BY MOUTH DAILY. 30 tablet 2  . meloxicam (MOBIC) 15 MG tablet TAKE 1 TABLET IN THE MORNING WITH BREAKFAST FOR 2 WEEKS THEN DAILY AS NEEDED FOR PAIN 30 tablet 3  . Multiple Vitamins-Minerals (MULTIVITAMIN PO) Take 1 tablet by mouth every morning.    . pantoprazole (PROTONIX) 40 MG tablet Take by mouth.    Marland Kitchen POTASSIUM PO Take by mouth.    . Red Yeast Rice 600 MG CAPS 2 caps every evening 2 capsule 1  . SYNJARDY 12.12-998 MG TABS TAKE 1 TABLET BY MOUTH 2 (TWO) TIMES DAILY. 60 tablet 3   No current facility-administered medications for this visit.    Allergies  Allergen Reactions  . Oysters [Shellfish Allergy] Nausea And Vomiting  . Prednisone Anxiety     Discussed warning signs or symptoms. Please see discharge instructions. Patient expresses understanding.

## 2018-05-16 NOTE — Patient Instructions (Signed)
Thank you for coming in today. Recheck in 5 months or so.  Return sooner if needed.  Recheck PSA.

## 2018-05-17 LAB — PSA: PSA: 1 ng/mL (ref <=4.0)

## 2018-05-21 ENCOUNTER — Telehealth: Payer: Self-pay | Admitting: Family Medicine

## 2018-05-21 NOTE — Telephone Encounter (Signed)
-----   Message from Gregor Hams, MD sent at 05/16/2018  8:13 AM EDT ----- Regarding: Sh[employer] Please add Alvester Chou to the [employer]

## 2018-05-21 NOTE — Telephone Encounter (Signed)
Added

## 2018-07-08 ENCOUNTER — Other Ambulatory Visit: Payer: Self-pay | Admitting: Family Medicine

## 2018-07-08 DIAGNOSIS — I10 Essential (primary) hypertension: Secondary | ICD-10-CM

## 2018-07-15 ENCOUNTER — Encounter: Payer: Self-pay | Admitting: Family Medicine

## 2018-07-15 DIAGNOSIS — H9193 Unspecified hearing loss, bilateral: Secondary | ICD-10-CM

## 2018-08-09 ENCOUNTER — Other Ambulatory Visit: Payer: Self-pay | Admitting: Family Medicine

## 2018-08-09 DIAGNOSIS — E119 Type 2 diabetes mellitus without complications: Secondary | ICD-10-CM

## 2018-09-08 ENCOUNTER — Other Ambulatory Visit: Payer: Self-pay | Admitting: Family Medicine

## 2018-09-17 ENCOUNTER — Ambulatory Visit: Payer: BLUE CROSS/BLUE SHIELD | Admitting: Family Medicine

## 2018-09-17 ENCOUNTER — Encounter: Payer: Self-pay | Admitting: Family Medicine

## 2018-09-17 VITALS — BP 129/75 | HR 83 | Temp 97.6°F | Ht 72.0 in | Wt 250.0 lb

## 2018-09-17 DIAGNOSIS — J3089 Other allergic rhinitis: Secondary | ICD-10-CM | POA: Diagnosis not present

## 2018-09-17 DIAGNOSIS — Z794 Long term (current) use of insulin: Secondary | ICD-10-CM | POA: Diagnosis not present

## 2018-09-17 DIAGNOSIS — J4521 Mild intermittent asthma with (acute) exacerbation: Secondary | ICD-10-CM | POA: Diagnosis not present

## 2018-09-17 DIAGNOSIS — E1140 Type 2 diabetes mellitus with diabetic neuropathy, unspecified: Secondary | ICD-10-CM | POA: Diagnosis not present

## 2018-09-17 LAB — POCT GLYCOSYLATED HEMOGLOBIN (HGB A1C): Hemoglobin A1C: 7.1 % — AB (ref 4.0–5.6)

## 2018-09-17 MED ORDER — ALBUTEROL SULFATE HFA 108 (90 BASE) MCG/ACT IN AERS: 1.0000 | INHALATION_SPRAY | Freq: Four times a day (QID) | RESPIRATORY_TRACT | 0 refills | Status: DC | PRN

## 2018-09-17 MED ORDER — FAMOTIDINE 40 MG PO TABS: 40.0000 mg | ORAL_TABLET | Freq: Every day | ORAL | 3 refills | Status: DC

## 2018-09-17 MED ORDER — AZELASTINE HCL 0.1 % NA SOLN: 2.0000 | Freq: Two times a day (BID) | NASAL | 12 refills | Status: DC

## 2018-09-17 NOTE — Progress Notes (Signed)
Antonio Rivera is a 63 y.o. male who presents to [place]: [facility] today for throat discomfort.  Antonio Rivera developed throat irritation and some mild swelling yesterday evening.  He was exposed to some cleaning chemicals in a restaurant and notes that it caused irritation and a cough.  For his being cleaned and he was able to inhale some of the vapors.  He has had similar episodes like this before.  He denies any trouble breathing or swallowing.  He denies any visible swelling.  He does not feel as though his throat is swelling shut.  He has a history of mild asthma and allergic rhinitis.  He has an old albuterol inhaler that is probably expired at home.  He has not tried any treatment yet.  No fevers or chills nausea vomiting or diarrhea.  Additionally he is here to follow-up his diabetes.  He takes medications listed below notes diabetes is typically pretty well controlled.  No polyuria or polydipsia.   ROS as above:  Exam:  BP 129/75   Pulse 83   Temp 97.6 F (36.4 C) (Oral)   Ht 6' (1.829 m)   Wt 250 lb (113.4 kg)   BMI 33.91 kg/m  Wt Readings from Last 5 Encounters:  09/17/18 250 lb (113.4 kg)  05/16/18 253 lb (114.8 kg)  02/13/18 254 lb (115.2 kg)  11/07/17 267 lb (121.1 kg)  08/14/17 269 lb (122 kg)    Gen: Well NAD HEENT: EOMI,  MMM visible throat swelling or tongue swelling.  Normal posterior pharynx tympanic membranes.  Normal nasal turbinates.  No cervical lymphadenopathy. Lungs: Normal work of breathing. CTABL Heart: RRR no MRG Abd: NABS, Soft. Nondistended, Nontender Exts: Brisk capillary refill, warm and well perfused.   Lab and Radiology Results Results for orders placed or performed in visit on 09/17/18 (from the past 72 hour(s))  POCT HgB A1C     Status: Abnormal   Collection Time: 09/17/18  2:58 PM  Result Value Ref Range   Hemoglobin A1C 7.1 (A)  4.0 - 5.6 %   HbA1c POC (<> result, manual entry)     HbA1c, POC (prediabetic range)     HbA1c, POC (controlled diabetic range)     No results found.    Assessment and Plan: 63 y.o. male with rhinitis and asthma likely due to irritant exposure.  Plan for oral antihistamines.  Will use cetirizine and famotidine.  Additionally will use nasal antihistamine Astelin nasal spray.  If not improving will use inhaled corticosteroids steroid nasal steroids as well.  Avoid oral steroids if possible as patient has had noxious side effects to the oral steroids in the past.  Diabetes is reasonably controlled.  Continue current regimen.  PDMP not reviewed this encounter. Orders Placed This Encounter  Procedures  . POCT HgB A1C   Meds ordered this encounter  Medications  . azelastine (ASTELIN) 0.1 % nasal spray    Sig: Place 2 sprays into both nostrils 2 (two) times daily. Use in each nostril as directed    Dispense:  30 mL    Refill:  12  . famotidine (PEPCID) 40 MG tablet    Sig: Take 1 tablet (40 mg total) by mouth daily.    Dispense:  60 tablet    Refill:  3  . albuterol (PROVENTIL HFA;VENTOLIN HFA) 108 (90 Base) MCG/ACT inhaler    Sig: Inhale 1-2 puffs into the lungs every 6 (six) hours as needed  for wheezing or shortness of breath.    Dispense:  1 Inhaler    Refill:  0     Historical information moved to improve visibility of documentation.  Past Medical History:  Diagnosis Date  . Diabetes mellitus    type 2  . HNP (herniated nucleus pulposus)    C6--7  . Hyperlipidemia   . Hypertension   . OSA on CPAP 1999   Past Surgical History:  Procedure Laterality Date  . ruptured disk surgery  1999   C6--7  . TONSILLECTOMY  age 55   Social History   Tobacco Use  . Smoking status: Former Smoker    Years: 30.00    Types: Pipe  . Smokeless tobacco: Never Used  Substance Use Topics  . Alcohol use: Yes    Alcohol/week: 1.0 standard drinks    Types: 1 Standard drinks or  equivalent per week    Comment: per week   family history includes Cancer in his brother, mother, and sister; Cancer (age of onset: 3) in his father.  Medications: Current Outpatient Medications  Medication Sig Dispense Refill  . albuterol (PROVENTIL HFA;VENTOLIN HFA) 108 (90 Base) MCG/ACT inhaler Inhale 1-2 puffs into the lungs every 6 (six) hours as needed for wheezing or shortness of breath. 1 Inhaler 0  . AMBULATORY NON FORMULARY MEDICATION Glucometer of choice with test test strips, lancets and other supplies.  Test daily To use as directed Dx: E11.9 Type 2 Diabetes 1 each 0  . AMBULATORY NON FORMULARY MEDICATION CPAP mask and supplies as needed.  Also provide 30 day download.  Send to [phone] (fax) Dx: OSA 1 Units 0  . aspirin 81 MG EC tablet Take 81 mg by mouth as needed.      Marland Kitchen atorvastatin (LIPITOR) 20 MG tablet TAKE 1 TABLET BY MOUTH EVERY DAY 30 tablet 5  . cetirizine (ZYRTEC) 10 MG tablet Take 1 tablet (10 mg total) by mouth daily. 60 tablet 0  . Fish Oil OIL by Does not apply route.    Marland Kitchen JANUVIA 100 MG tablet TAKE 1 TABLET (100 MG TOTAL) BY MOUTH DAILY. 30 tablet 2  . lisinopril (PRINIVIL,ZESTRIL) 10 MG tablet TAKE 1 TABLET (10 MG TOTAL) BY MOUTH DAILY. 30 tablet 2  . meloxicam (MOBIC) 15 MG tablet TAKE 1 TABLET IN THE MORNING WITH BREAKFAST FOR 2 WEEKS THEN DAILY AS NEEDED FOR PAIN 30 tablet 3  . Multiple Vitamins-Minerals (MULTIVITAMIN PO) Take 1 tablet by mouth every morning.    . pantoprazole (PROTONIX) 40 MG tablet Take by mouth.    Marland Kitchen POTASSIUM PO Take by mouth.    . Red Yeast Rice 600 MG CAPS 2 caps every evening 2 capsule 1  . SYNJARDY 12.12-998 MG TABS TAKE 1 TABLET BY MOUTH 2 (TWO) TIMES DAILY. 60 tablet 3  . azelastine (ASTELIN) 0.1 % nasal spray Place 2 sprays into both nostrils 2 (two) times daily. Use in each nostril as directed 30 mL 12  . famotidine (PEPCID) 40 MG tablet Take 1 tablet (40 mg total) by mouth daily. 60 tablet 3   No current  facility-administered medications for this visit.    Allergies  Allergen Reactions  . Oysters [Shellfish Allergy] Nausea And Vomiting  . Prednisone Anxiety     Discussed warning signs or symptoms. Please see discharge instructions. Patient expresses understanding.

## 2018-09-17 NOTE — Patient Instructions (Addendum)
Thank you for coming in today. Take over the counter zyrtec twice daily for 1 week then weaning to once daily for a 1-3 weeks.  Take with famotidine (pepcid) twice daily for 1-3 weeks.  Use with the astelin nasal spray.  Use albuterol as needed.  Call or go to the emergency room if you get worse, have trouble breathing, have chest pains, or palpitations.   If not improving will consider spray steroids for the nose and for the mouth and lungs.   Last resort is oral steroids like prednisone.     Angioedema  Angioedema is sudden swelling in the body. The swelling can happen in any part of the body. It often happens on the skin and causes itchy, bumpy patches (hives) to form. This condition may:  Happen only one time.  Happen more than one time. It may come back at random times.  Keep coming back for a number of years. Someday it may stop coming back. Follow these instructions at home:  Take over-the-counter and prescription medicines only as told by your doctor.  If you were given medicines for emergency allergy treatment, always carry them with you.  Wear a medical bracelet as told by your doctor.  Avoid the things that cause your attacks (triggers).  If this condition was passed to you from your parents and you want to have kids, talk to your doctor. Your kids may also have this condition. Contact a doctor if:  You have another attack.  Your attacks happen more often, even after you take steps to prevent them.  This condition was passed to you by your parents and you want to have kids. Get help right away if:  Your mouth, tongue, or lips get very swollen.  You have trouble breathing.  You have trouble swallowing.  You pass out (faint). This information is not intended to replace advice given to you by your health care provider. Make sure you discuss any questions you have with your health care provider. Document Released: 07/12/2009 Document Revised: 02/23/2016  Document Reviewed: 02/01/2016 Elsevier Interactive Patient Education  2019 [employer].

## 2018-10-10 ENCOUNTER — Other Ambulatory Visit: Payer: Self-pay | Admitting: Family Medicine

## 2018-10-10 DIAGNOSIS — I10 Essential (primary) hypertension: Secondary | ICD-10-CM

## 2018-10-15 ENCOUNTER — Ambulatory Visit: Payer: BLUE CROSS/BLUE SHIELD | Admitting: Family Medicine

## 2018-11-09 ENCOUNTER — Encounter: Payer: Self-pay | Admitting: Family Medicine

## 2018-11-12 ENCOUNTER — Other Ambulatory Visit: Payer: Self-pay | Admitting: Family Medicine

## 2018-11-12 DIAGNOSIS — E119 Type 2 diabetes mellitus without complications: Secondary | ICD-10-CM

## 2018-12-14 ENCOUNTER — Other Ambulatory Visit: Payer: Self-pay | Admitting: Family Medicine

## 2018-12-14 DIAGNOSIS — E119 Type 2 diabetes mellitus without complications: Secondary | ICD-10-CM

## 2018-12-16 ENCOUNTER — Encounter: Payer: Self-pay | Admitting: Family Medicine

## 2018-12-16 ENCOUNTER — Ambulatory Visit: Payer: BLUE CROSS/BLUE SHIELD | Admitting: Family Medicine

## 2018-12-16 VITALS — BP 118/71 | HR 78 | Temp 97.8°F | Wt 252.0 lb

## 2018-12-16 DIAGNOSIS — E1140 Type 2 diabetes mellitus with diabetic neuropathy, unspecified: Secondary | ICD-10-CM

## 2018-12-16 DIAGNOSIS — E119 Type 2 diabetes mellitus without complications: Secondary | ICD-10-CM | POA: Diagnosis not present

## 2018-12-16 DIAGNOSIS — I10 Essential (primary) hypertension: Secondary | ICD-10-CM

## 2018-12-16 LAB — POCT GLYCOSYLATED HEMOGLOBIN (HGB A1C): Hemoglobin A1C: 7 % — AB (ref 4.0–5.6)

## 2018-12-16 MED ORDER — ATORVASTATIN CALCIUM 20 MG PO TABS: 20.0000 mg | ORAL_TABLET | Freq: Every day | ORAL | 1 refills | Status: DC

## 2018-12-16 MED ORDER — EMPAGLIFLOZIN-METFORMIN HCL 12.5-1000 MG PO TABS: 1.0000 | ORAL_TABLET | Freq: Two times a day (BID) | ORAL | 1 refills | Status: DC

## 2018-12-16 MED ORDER — LISINOPRIL 10 MG PO TABS: 10.0000 mg | ORAL_TABLET | Freq: Every day | ORAL | 1 refills | Status: DC

## 2018-12-16 MED ORDER — SITAGLIPTIN PHOSPHATE 100 MG PO TABS: 100.0000 mg | ORAL_TABLET | Freq: Every day | ORAL | 1 refills | Status: DC

## 2018-12-16 NOTE — Patient Instructions (Signed)
Thank you for coming in today.  Continue current medicines.   Recheck in 3 months or sooner if needed.   Keep on exercising and keeping the carbs low.

## 2018-12-16 NOTE — Progress Notes (Signed)
Antonio Rivera is a 63 y.o. male who presents to [place]: [facility] today for diabetes hypertension.  Diabetes: Patient takes medications listed below.  He notes his blood sugars are typically in the 120s to 150s fasting.  He denies any hypoglycemic episodes or hypoglycemic episodes.  He had a eye exam done at [facility] in July last year.  He notes it was normal.  Hypertension: Taking medications listed below.  No chest pain palpitations shortness of breath.   ROS as above:  Exam:  BP 118/71   Pulse 78   Temp 97.8 F (36.6 C) (Oral)   Wt 252 lb (114.3 kg)   BMI 34.18 kg/m  Wt Readings from Last 5 Encounters:  12/16/18 252 lb (114.3 kg)  09/17/18 250 lb (113.4 kg)  05/16/18 253 lb (114.8 kg)  02/13/18 254 lb (115.2 kg)  11/07/17 267 lb (121.1 kg)    Gen: Well NAD HEENT: EOMI,  MMM Lungs: Normal work of breathing. CTABL Heart: RRR no MRG Abd: NABS, Soft. Nondistended, Nontender Exts: Brisk capillary refill, warm and well perfused.   Lab and Radiology Results Results for orders placed or performed in visit on 12/16/18 (from the past 72 hour(s))  POCT HgB A1C     Status: Abnormal   Collection Time: 12/16/18  4:39 PM  Result Value Ref Range   Hemoglobin A1C 7.0 (A) 4.0 - 5.6 %   HbA1c POC (<> result, manual entry)     HbA1c, POC (prediabetic range)     HbA1c, POC (controlled diabetic range)     No results found.    Assessment and Plan: 63 y.o. male with  Diabetes: Doing well continue current regimen.  Work on weight loss exercise.  Check 3 months.  Hypertension: Doing well continue current regimen.   PDMP not reviewed this encounter. Orders Placed This Encounter  Procedures  . POCT HgB A1C   Meds ordered this encounter  Medications  . atorvastatin (LIPITOR) 20 MG tablet    Sig: Take 1 tablet (20 mg total) by mouth daily.   Dispense:  90 tablet    Refill:  1  . sitaGLIPtin (JANUVIA) 100 MG tablet    Sig: Take 1 tablet (100 mg total) by mouth daily.    Dispense:  90 tablet    Refill:  1  . lisinopril (ZESTRIL) 10 MG tablet    Sig: Take 1 tablet (10 mg total) by mouth daily.    Dispense:  90 tablet    Refill:  1  . Empagliflozin-metFORMIN HCl (SYNJARDY) 12.12-998 MG TABS    Sig: Take 1 tablet by mouth 2 (two) times daily.    Dispense:  180 tablet    Refill:  1     Historical information moved to improve visibility of documentation.  Past Medical History:  Diagnosis Date  . Diabetes mellitus    type 2  . HNP (herniated nucleus pulposus)    C6--7  . Hyperlipidemia   . Hypertension   . OSA on CPAP 1999   Past Surgical History:  Procedure Laterality Date  . ruptured disk surgery  1999   C6--7  . TONSILLECTOMY  age 48   Social History   Tobacco Use  . Smoking status: Former Smoker    Years: 30.00    Types: Pipe  . Smokeless tobacco: Never Used  Substance Use Topics  . Alcohol use: Yes    Alcohol/week: 1.0 standard drinks  Types: 1 Standard drinks or equivalent per week    Comment: per week   family history includes Cancer in his brother, mother, and sister; Cancer (age of onset: 39) in his father.  Medications: Current Outpatient Medications  Medication Sig Dispense Refill  . albuterol (PROVENTIL HFA;VENTOLIN HFA) 108 (90 Base) MCG/ACT inhaler Inhale 1-2 puffs into the lungs every 6 (six) hours as needed for wheezing or shortness of breath. 1 Inhaler 0  . AMBULATORY NON FORMULARY MEDICATION Glucometer of choice with test test strips, lancets and other supplies.  Test daily To use as directed Dx: E11.9 Type 2 Diabetes 1 each 0  . AMBULATORY NON FORMULARY MEDICATION CPAP mask and supplies as needed.  Also provide 30 day download.  Send to [phone] (fax) Dx: OSA 1 Units 0  . aspirin 81 MG EC tablet Take 81 mg by mouth as needed.      Marland Kitchen atorvastatin (LIPITOR) 20 MG tablet Take 1  tablet (20 mg total) by mouth daily. 90 tablet 1  . azelastine (ASTELIN) 0.1 % nasal spray Place 2 sprays into both nostrils 2 (two) times daily. Use in each nostril as directed 30 mL 12  . cetirizine (ZYRTEC) 10 MG tablet Take 1 tablet (10 mg total) by mouth daily. 60 tablet 0  . Empagliflozin-metFORMIN HCl (SYNJARDY) 12.12-998 MG TABS Take 1 tablet by mouth 2 (two) times daily. 180 tablet 1  . famotidine (PEPCID) 40 MG tablet Take 1 tablet (40 mg total) by mouth daily. 60 tablet 3  . Fish Oil OIL by Does not apply route.    Marland Kitchen lisinopril (ZESTRIL) 10 MG tablet Take 1 tablet (10 mg total) by mouth daily. 90 tablet 1  . Multiple Vitamins-Minerals (MULTIVITAMIN PO) Take 1 tablet by mouth every morning.    . pantoprazole (PROTONIX) 40 MG tablet Take by mouth.    Marland Kitchen POTASSIUM PO Take by mouth.    . Red Yeast Rice 600 MG CAPS 2 caps every evening 2 capsule 1  . sitaGLIPtin (JANUVIA) 100 MG tablet Take 1 tablet (100 mg total) by mouth daily. 90 tablet 1   No current facility-administered medications for this visit.    Allergies  Allergen Reactions  . Oysters [Shellfish Allergy] Nausea And Vomiting  . Prednisone Anxiety     Discussed warning signs or symptoms. Please see discharge instructions. Patient expresses understanding.

## 2019-03-18 ENCOUNTER — Other Ambulatory Visit: Payer: Self-pay

## 2019-03-18 ENCOUNTER — Ambulatory Visit: Payer: BC Managed Care – PPO | Admitting: Family Medicine

## 2019-03-18 ENCOUNTER — Encounter: Payer: Self-pay | Admitting: Family Medicine

## 2019-03-18 VITALS — BP 127/67 | HR 78 | Temp 98.4°F | Wt 251.0 lb

## 2019-03-18 DIAGNOSIS — E785 Hyperlipidemia, unspecified: Secondary | ICD-10-CM | POA: Diagnosis not present

## 2019-03-18 DIAGNOSIS — R972 Elevated prostate specific antigen [PSA]: Secondary | ICD-10-CM

## 2019-03-18 DIAGNOSIS — Z23 Encounter for immunization: Secondary | ICD-10-CM

## 2019-03-18 DIAGNOSIS — I10 Essential (primary) hypertension: Secondary | ICD-10-CM | POA: Diagnosis not present

## 2019-03-18 DIAGNOSIS — E1140 Type 2 diabetes mellitus with diabetic neuropathy, unspecified: Secondary | ICD-10-CM

## 2019-03-18 DIAGNOSIS — E669 Obesity, unspecified: Secondary | ICD-10-CM

## 2019-03-18 LAB — POCT GLYCOSYLATED HEMOGLOBIN (HGB A1C): Hemoglobin A1C: 6.6 % — AB (ref 4.0–5.6)

## 2019-03-18 NOTE — Progress Notes (Signed)
Antonio Rivera is a 63 y.o. male who presents to [place]: [place] today for follow-up diabetes, hypertension, hyperlipidemia.  Diabetes: Doing well with Antonio Rivera.    His blood sugars are typically quite well controlled.  No hyper glycemia or hypoglycemic episodes.  Hypertension: Managed with lisinopril.  Blood pressure well controlled.  No chest pain palpitations shortness of breath lightheadedness or dizziness.  Hyper lipidemia: Managed with atorvastatin and aspirin.  Doing well with no issues.  Patient notes that he recently was laid off from work and fortunately retirement.  He is somewhat ambivalent about it and would like to have kept working but notes his overall stress level has reduced now that he is not working any longer.   ROS as above:  Exam:  BP 127/67   Pulse 78   Temp 98.4 F (36.9 C) (Oral)   Wt 251 lb (113.9 kg)   BMI 34.04 kg/m  Wt Readings from Last 5 Encounters:  03/18/19 251 lb (113.9 kg)  12/16/18 252 lb (114.3 kg)  09/17/18 250 lb (113.4 kg)  05/16/18 253 lb (114.8 kg)  02/13/18 254 lb (115.2 kg)    Gen: Well NAD HEENT: EOMI,  MMM Lungs: Normal work of breathing. CTABL Heart: RRR no MRG Abd: NABS, Soft. Nondistended, Nontender Exts: Brisk capillary refill, warm and well perfused.   Lab and Radiology Results No results found for this or any previous visit (from the past 72 hour(s)). No results found. A1c is 6.6   Assessment and Plan: 63 y.o. male with  Diabetes: Doing well.  Plan to continue current regimen.  A1c well controlled.  Plan to check basic fasting labs in near future and recheck in about 6 months.  Will establish care with new PCP at that point.  Hypertension: Blood pressure reasonably managed.  Continue current regimen.  Fasting labs as below.  Hyperlipidemia: Also doing quite well.  Plan to check fasting  labs in near future.  History of increased PSA velocity.  Recheck PSA.  As noted I am transitioning to purely sports medicine starting in November and patient will establish care with new PCP.  Recommend Antonio Rivera.  Schedule visit with Antonio Rivera in 6 months.  Additionally plan to administer Shingrix vaccine 1/2 today.  Will schedule nurse visit in 2 months for Shingrix vaccine 2/2 and flu vaccine.  PDMP not reviewed this encounter. Orders Placed This Encounter  Procedures  . CBC  . COMPLETE METABOLIC PANEL WITH GFR  . Lipid Panel w/reflex Direct LDL  . PSA  . POCT HgB A1C   No orders of the defined types were placed in this encounter.    Historical information moved to improve visibility of documentation.  Past Medical History:  Diagnosis Date  . Diabetes mellitus    type 2  . HNP (herniated nucleus pulposus)    C6--7  . Hyperlipidemia   . Hypertension   . OSA on CPAP 1999   Past Surgical History:  Procedure Laterality Date  . ruptured disk surgery  1999   C6--7  . TONSILLECTOMY  age 6   Social History   Tobacco Use  . Smoking status: Former Smoker    Years: 30.00    Types: Pipe  . Smokeless tobacco: Never Used  Substance Use Topics  . Alcohol use: Yes    Alcohol/week: 1.0 standard drinks    Types: 1 Standard drinks or equivalent per week    Comment: per week  family history includes Cancer in his brother, mother, and sister; Cancer (age of onset: 50) in his father.  Medications: Current Outpatient Medications  Medication Sig Dispense Refill  . albuterol (PROVENTIL HFA;VENTOLIN HFA) 108 (90 Base) MCG/ACT inhaler Inhale 1-2 puffs into the lungs every 6 (six) hours as needed for wheezing or shortness of breath. 1 Inhaler 0  . AMBULATORY NON FORMULARY MEDICATION Glucometer of choice with test test strips, lancets and other supplies.  Test daily To use as directed Dx: E11.9 Type 2 Diabetes 1 each 0  . AMBULATORY NON FORMULARY MEDICATION CPAP mask and  supplies as needed.  Also provide 30 day download.  Send to [phone] (fax) Dx: OSA 1 Units 0  . aspirin 81 MG EC tablet Take 81 mg by mouth as needed.      Marland Kitchen atorvastatin (LIPITOR) 20 MG tablet Take 1 tablet (20 mg total) by mouth daily. 90 tablet 1  . azelastine (ASTELIN) 0.1 % nasal spray Place 2 sprays into both nostrils 2 (two) times daily. Use in each nostril as directed 30 mL 12  . cetirizine (ZYRTEC) 10 MG tablet Take 1 tablet (10 mg total) by mouth daily. 60 tablet 0  . Empagliflozin-metFORMIN HCl (SYNJARDY) 12.12-998 MG TABS Take 1 tablet by mouth 2 (two) times daily. 180 tablet 1  . famotidine (PEPCID) 40 MG tablet Take 1 tablet (40 mg total) by mouth daily. 60 tablet 3  . Fish Oil OIL by Does not apply route.    Marland Kitchen lisinopril (ZESTRIL) 10 MG tablet Take 1 tablet (10 mg total) by mouth daily. 90 tablet 1  . Multiple Vitamins-Minerals (MULTIVITAMIN PO) Take 1 tablet by mouth every morning.    . pantoprazole (PROTONIX) 40 MG tablet Take by mouth.    Marland Kitchen POTASSIUM PO Take by mouth.    . Red Yeast Rice 600 MG CAPS 2 caps every evening 2 capsule 1  . sitaGLIPtin (JANUVIA) 100 MG tablet Take 1 tablet (100 mg total) by mouth daily. 90 tablet 1   No current facility-administered medications for this visit.    Allergies  Allergen Reactions  . Oysters [Shellfish Allergy] Nausea And Vomiting  . Prednisone Anxiety     Discussed warning signs or symptoms. Please see discharge instructions. Patient expresses understanding.

## 2019-03-18 NOTE — Patient Instructions (Addendum)
Thank you for coming in today. Be careful with diet.  Avoid carbs and sugars.  Get fasting labs soon.  Schedule with Dr Sheppard Coil for diabetes folllow up in 6 months. We will do 1st shingles vaccine today and repeat shingles vaccine with flu vaccine in 2 months.

## 2019-05-19 ENCOUNTER — Ambulatory Visit (INDEPENDENT_AMBULATORY_CARE_PROVIDER_SITE_OTHER): Payer: BC Managed Care – PPO | Admitting: Osteopathic Medicine

## 2019-05-19 ENCOUNTER — Other Ambulatory Visit: Payer: Self-pay

## 2019-05-19 DIAGNOSIS — E785 Hyperlipidemia, unspecified: Secondary | ICD-10-CM | POA: Diagnosis not present

## 2019-05-19 DIAGNOSIS — I10 Essential (primary) hypertension: Secondary | ICD-10-CM | POA: Diagnosis not present

## 2019-05-19 DIAGNOSIS — Z23 Encounter for immunization: Secondary | ICD-10-CM

## 2019-05-19 DIAGNOSIS — E1140 Type 2 diabetes mellitus with diabetic neuropathy, unspecified: Secondary | ICD-10-CM | POA: Diagnosis not present

## 2019-05-19 DIAGNOSIS — R972 Elevated prostate specific antigen [PSA]: Secondary | ICD-10-CM | POA: Diagnosis not present

## 2019-05-19 NOTE — Progress Notes (Signed)
Established Patient Office Visit  Subjective:  Patient ID: Antonio Rivera, male    DOB: [birth year]  Age: 63 y.o. MRN: [id number]  CC: No chief complaint on file.   HPI Antonio Rivera presents for Flu vaccine and Shingles.   Past Medical History:  Diagnosis Date  . Diabetes mellitus    type 2  . HNP (herniated nucleus pulposus)    C6--7  . Hyperlipidemia   . Hypertension   . OSA on CPAP 1999    Past Surgical History:  Procedure Laterality Date  . ruptured disk surgery  1999   C6--7  . TONSILLECTOMY  age 84    Family History  Problem Relation Age of Onset  . Cancer Mother        skin  . Cancer Father 87       prostate/died age 42   . Cancer Sister        ovarian cancer  . Cancer Brother        skin cancer    Social History   Socioeconomic History  . Marital status: Single    Spouse name: Not on file  . Number of children: Not on file  . Years of education: Not on file  . Highest education level: Not on file  Occupational History  . Not on file  Social Needs  . Financial resource strain: Not on file  . Food insecurity    Worry: Not on file    Inability: Not on file  . Transportation needs    Medical: Not on file    Non-medical: Not on file  Tobacco Use  . Smoking status: Former Smoker    Years: 30.00    Types: Pipe  . Smokeless tobacco: Never Used  Substance and Sexual Activity  . Alcohol use: Yes    Alcohol/week: 1.0 standard drinks    Types: 1 Standard drinks or equivalent per week    Comment: per week  . Drug use: No  . Sexual activity: Not on file  Lifestyle  . Physical activity    Days per week: Not on file    Minutes per session: Not on file  . Stress: Not on file  Relationships  . Social Herbalist on phone: Not on file    Gets together: Not on file    Attends religious service: Not on file    Active member of club or organization: Not on file    Attends meetings of clubs or organizations: Not on file    Relationship  status: Not on file  . Intimate partner violence    Fear of current or ex partner: Not on file    Emotionally abused: Not on file    Physically abused: Not on file    Forced sexual activity: Not on file  Other Topics Concern  . Not on file  Social History Narrative  . Not on file    Outpatient Medications Prior to Visit  Medication Sig Dispense Refill  . albuterol (PROVENTIL HFA;VENTOLIN HFA) 108 (90 Base) MCG/ACT inhaler Inhale 1-2 puffs into the lungs every 6 (six) hours as needed for wheezing or shortness of breath. 1 Inhaler 0  . AMBULATORY NON FORMULARY MEDICATION Glucometer of choice with test test strips, lancets and other supplies.  Test daily To use as directed Dx: E11.9 Type 2 Diabetes 1 each 0  . AMBULATORY NON FORMULARY MEDICATION CPAP mask and supplies as needed.  Also provide 30 day download.  Send to  [phone] (fax) Dx: OSA 1 Units 0  . aspirin 81 MG EC tablet Take 81 mg by mouth as needed.      Marland Kitchen atorvastatin (LIPITOR) 20 MG tablet Take 1 tablet (20 mg total) by mouth daily. 90 tablet 1  . azelastine (ASTELIN) 0.1 % nasal spray Place 2 sprays into both nostrils 2 (two) times daily. Use in each nostril as directed 30 mL 12  . cetirizine (ZYRTEC) 10 MG tablet Take 1 tablet (10 mg total) by mouth daily. 60 tablet 0  . Empagliflozin-metFORMIN HCl (SYNJARDY) 12.12-998 MG TABS Take 1 tablet by mouth 2 (two) times daily. 180 tablet 1  . famotidine (PEPCID) 40 MG tablet Take 1 tablet (40 mg total) by mouth daily. 60 tablet 3  . Fish Oil OIL by Does not apply route.    Marland Kitchen lisinopril (ZESTRIL) 10 MG tablet Take 1 tablet (10 mg total) by mouth daily. 90 tablet 1  . Multiple Vitamins-Minerals (MULTIVITAMIN PO) Take 1 tablet by mouth every morning.    . pantoprazole (PROTONIX) 40 MG tablet Take by mouth.    Marland Kitchen POTASSIUM PO Take by mouth.    . Red Yeast Rice 600 MG CAPS 2 caps every evening 2 capsule 1  . sitaGLIPtin (JANUVIA) 100 MG tablet Take 1 tablet (100 mg total) by mouth  daily. 90 tablet 1   No facility-administered medications prior to visit.     Allergies  Allergen Reactions  . Oysters [Shellfish Allergy] Nausea And Vomiting  . Prednisone Anxiety    ROS Review of Systems    Objective:    Physical Exam  There were no vitals taken for this visit. Wt Readings from Last 3 Encounters:  03/18/19 251 lb (113.9 kg)  12/16/18 252 lb (114.3 kg)  09/17/18 250 lb (113.4 kg)     Health Maintenance Due  Topic Date Due  . OPHTHALMOLOGY EXAM  03/05/2018  . INFLUENZA VACCINE  03/08/2019    There are no preventive care reminders to display for this patient.  No results found for: TSH Lab Results  Component Value Date   WBC 6.0 11/07/2017   HGB 13.8 11/07/2017   HCT 42.4 11/07/2017   MCV 80.0 11/07/2017   PLT 250 11/07/2017   Lab Results  Component Value Date   NA 140 11/07/2017   K 4.7 11/07/2017   CO2 26 11/07/2017   GLUCOSE 158 (H) 11/07/2017   BUN 14 11/07/2017   CREATININE 0.88 11/07/2017   BILITOT 0.6 11/07/2017   ALKPHOS 54 10/16/2016   AST 24 11/07/2017   ALT 31 11/07/2017   PROT 6.8 11/07/2017   ALBUMIN 4.1 10/16/2016   CALCIUM 9.2 11/07/2017   Lab Results  Component Value Date   CHOL 145 11/07/2017   Lab Results  Component Value Date   HDL 52 11/07/2017   Lab Results  Component Value Date   LDLCALC 72 11/07/2017   Lab Results  Component Value Date   TRIG 124 11/07/2017   Lab Results  Component Value Date   CHOLHDL 2.8 11/07/2017   Lab Results  Component Value Date   HGBA1C 6.6 (A) 03/18/2019      Assessment & Plan:  Vaccines - Patient tolerated injection well without complications. Last Shingrix, no follow up needed.    Problem List Items Addressed This Visit    None    Visit Diagnoses    Need for shingles vaccine    -  Primary   Relevant Orders   Varicella-zoster vaccine IM (Shingrix) (Completed)  Need for immunization against influenza       Relevant Orders   Flu Vaccine QUAD 36+ mos IM  (Completed)      No orders of the defined types were placed in this encounter.   Follow-up: No follow-ups on file.    Lavell Luster, [place]

## 2019-05-20 ENCOUNTER — Other Ambulatory Visit: Payer: Self-pay | Admitting: Family Medicine

## 2019-05-20 LAB — COMPLETE METABOLIC PANEL WITH GFR
AG Ratio: 1.8 (calc) (ref 1.0–2.5)
ALT: 41 U/L (ref 9–46)
AST: 27 U/L (ref 10–35)
Albumin: 4.2 g/dL (ref 3.6–5.1)
Alkaline phosphatase (APISO): 56 U/L (ref 35–144)
BUN: 14 mg/dL (ref 7–25)
CO2: 25 mmol/L (ref 20–32)
Calcium: 9.4 mg/dL (ref 8.6–10.3)
Chloride: 106 mmol/L (ref 98–110)
Creat: 0.9 mg/dL (ref 0.70–1.25)
GFR, Est African American: 105 mL/min/{1.73_m2} (ref >=60)
GFR, Est Non African American: 91 mL/min/{1.73_m2} (ref >=60)
Globulin: 2.4 g/dL (calc) (ref 1.9–3.7)
Glucose, Bld: 156 mg/dL — ABNORMAL HIGH (ref 65–99)
Potassium: 4.7 mmol/L (ref 3.5–5.3)
Sodium: 138 mmol/L (ref 135–146)
Total Bilirubin: 0.3 mg/dL (ref 0.2–1.2)
Total Protein: 6.6 g/dL (ref 6.1–8.1)

## 2019-05-20 LAB — CBC
HCT: 49.8 % (ref 38.5–50.0)
Hemoglobin: 16.3 g/dL (ref 13.2–17.1)
MCH: 30.7 pg (ref 27.0–33.0)
MCHC: 32.7 g/dL (ref 32.0–36.0)
MCV: 93.8 fL (ref 80.0–100.0)
MPV: 10.4 fL (ref 7.5–12.5)
Platelets: 213 10*3/uL (ref 140–400)
RBC: 5.31 10*6/uL (ref 4.20–5.80)
RDW: 13.6 % (ref 11.0–15.0)
WBC: 6.9 10*3/uL (ref 3.8–10.8)

## 2019-05-20 LAB — LIPID PANEL W/REFLEX DIRECT LDL
Cholesterol: 125 mg/dL (ref <200)
HDL: 56 mg/dL (ref >=40)
LDL Cholesterol (Calc): 49 mg/dL (calc)
Non-HDL Cholesterol (Calc): 69 mg/dL (calc) (ref <130)
Total CHOL/HDL Ratio: 2.2 (calc) (ref <5.0)
Triglycerides: 110 mg/dL (ref <150)

## 2019-05-20 LAB — PSA: PSA: 0.9 ng/mL (ref <=4.0)

## 2019-06-03 DIAGNOSIS — H259 Unspecified age-related cataract: Secondary | ICD-10-CM | POA: Diagnosis not present

## 2019-06-03 DIAGNOSIS — H524 Presbyopia: Secondary | ICD-10-CM | POA: Diagnosis not present

## 2019-06-03 DIAGNOSIS — E119 Type 2 diabetes mellitus without complications: Secondary | ICD-10-CM | POA: Diagnosis not present

## 2019-06-03 DIAGNOSIS — H318 Other specified disorders of choroid: Secondary | ICD-10-CM | POA: Diagnosis not present

## 2019-06-16 ENCOUNTER — Other Ambulatory Visit: Payer: Self-pay | Admitting: Family Medicine

## 2019-06-16 DIAGNOSIS — E119 Type 2 diabetes mellitus without complications: Secondary | ICD-10-CM

## 2019-07-26 ENCOUNTER — Other Ambulatory Visit: Payer: Self-pay | Admitting: Family Medicine

## 2019-07-26 DIAGNOSIS — I10 Essential (primary) hypertension: Secondary | ICD-10-CM

## 2019-07-26 DIAGNOSIS — E119 Type 2 diabetes mellitus without complications: Secondary | ICD-10-CM

## 2019-07-28 ENCOUNTER — Encounter: Payer: Self-pay | Admitting: Family Medicine

## 2019-08-16 ENCOUNTER — Other Ambulatory Visit: Payer: Self-pay | Admitting: Family Medicine

## 2019-08-17 ENCOUNTER — Other Ambulatory Visit: Payer: Self-pay | Admitting: Family Medicine

## 2019-09-19 ENCOUNTER — Ambulatory Visit: Payer: BC Managed Care – PPO | Admitting: Osteopathic Medicine

## 2019-09-23 ENCOUNTER — Encounter: Payer: Self-pay | Admitting: Osteopathic Medicine

## 2019-09-23 ENCOUNTER — Other Ambulatory Visit: Payer: Self-pay

## 2019-09-23 ENCOUNTER — Ambulatory Visit: Payer: 59 | Admitting: Osteopathic Medicine

## 2019-09-23 VITALS — BP 116/73 | HR 71 | Temp 98.0°F | Wt 250.0 lb

## 2019-09-23 DIAGNOSIS — N529 Male erectile dysfunction, unspecified: Secondary | ICD-10-CM

## 2019-09-23 DIAGNOSIS — I10 Essential (primary) hypertension: Secondary | ICD-10-CM

## 2019-09-23 DIAGNOSIS — B356 Tinea cruris: Secondary | ICD-10-CM

## 2019-09-23 DIAGNOSIS — E1140 Type 2 diabetes mellitus with diabetic neuropathy, unspecified: Secondary | ICD-10-CM | POA: Diagnosis not present

## 2019-09-23 DIAGNOSIS — G2581 Restless legs syndrome: Secondary | ICD-10-CM | POA: Diagnosis not present

## 2019-09-23 LAB — POCT GLYCOSYLATED HEMOGLOBIN (HGB A1C): Hemoglobin A1C: 7 % — AB (ref 4.0–5.6)

## 2019-09-23 MED ORDER — CETIRIZINE HCL 10 MG PO TABS: 10.0000 mg | ORAL_TABLET | Freq: Every day | ORAL | 0 refills | Status: DC

## 2019-09-23 MED ORDER — SILDENAFIL CITRATE 100 MG PO TABS: 50.0000 mg | ORAL_TABLET | Freq: Every day | ORAL | 11 refills | Status: DC | PRN

## 2019-09-23 MED ORDER — NYSTATIN POWD: 2 refills | Status: DC

## 2019-09-23 MED ORDER — ATORVASTATIN CALCIUM 20 MG PO TABS: 20.0000 mg | ORAL_TABLET | Freq: Every day | ORAL | 3 refills | Status: DC

## 2019-09-23 MED ORDER — ASPIRIN 81 MG PO TBEC: 81.0000 mg | DELAYED_RELEASE_TABLET | Freq: Every day | ORAL | 3 refills | Status: AC

## 2019-09-23 MED ORDER — GABAPENTIN 100 MG PO CAPS: 100.0000 mg | ORAL_CAPSULE | Freq: Every day | ORAL | 1 refills | Status: DC

## 2019-09-23 NOTE — Patient Instructions (Signed)
Message me in a few weeks, let me know how medicines are working!

## 2019-09-23 NOTE — Progress Notes (Signed)
Antonio Rivera is a 64 y.o. male who presents to  [place] at [facility]  today, 09/23/19, seeking care for the following:  The primary encounter diagnosis was Type 2 diabetes mellitus with diabetic neuropathy, without long-term current use of insulin ([place]). Diagnoses of Essential hypertension, benign, Erectile dysfunction, unspecified erectile dysfunction type, Restless legs, and Jock itch were also pertinent to this visit.    ASSESSMENT & PLAN with other pertinent history/findings:  1. Type 2 diabetes mellitus with diabetic neuropathy, without long-term current use of insulin (H[facility]) Stable, continue current meds, AC up a bit, closer f/u   2. Essential hypertension, benign At goal  3. Erectile dysfunction, unspecified erectile dysfunction type Viagra Rx Consider T testing   4. Restless legs Trial Gabapentin Declined iron screening Consider Requip if Gabapentin AE/ineffective  5. Jock itch Trial nystatin, consider po antifungals  Last CMP 05/2019 was WNL       Orders Placed This Encounter  Procedures  . POCT HgB A1C    Meds ordered this encounter  Medications  . atorvastatin (LIPITOR) 20 MG tablet    Sig: Take 1 tablet (20 mg total) by mouth daily.    Dispense:  90 tablet    Refill:  3  . aspirin 81 MG EC tablet    Sig: Take 1 tablet (81 mg total) by mouth daily.    Dispense:  90 tablet    Refill:  3  . cetirizine (ZYRTEC) 10 MG tablet    Sig: Take 1 tablet (10 mg total) by mouth daily.    Dispense:  60 tablet    Refill:  0  . gabapentin (NEURONTIN) 100 MG capsule    Sig: Take 1-4 capsules (100-400 mg total) by mouth at bedtime.    Dispense:  90 capsule    Refill:  1  . sildenafil (VIAGRA) 100 MG tablet    Sig: Take 0.5-1 tablets (50-100 mg total) by mouth daily as needed for erectile dysfunction.    Dispense:  10 tablet    Refill:  11  . Nystatin POWD    Sig: Apply liberally to affected area 2 times per day     Dispense:  1 each    Refill:  2    Patient Instructions  Message me in a few weeks, let me know how medicines are working!       Follow-up instructions: Return in about 4 months (around 01/21/2020) for recheck A1C, see me sooner if needed! .              BP 116/73 (BP Location: Left Arm, Patient Position: Sitting, Cuff Size: Normal)   Pulse 71   Temp 98 F (36.7 C) (Oral)   Wt 250 lb 0.6 oz (113.4 kg)   BMI 33.91 kg/m   No outpatient medications have been marked as taking for the 09/23/19 encounter (Office Visit) with Emeterio Reeve, DO.    Results for orders placed or performed in visit on 09/23/19 (from the past 72 hour(s))  POCT HgB A1C     Status: Abnormal   Collection Time: 09/23/19  7:34 AM  Result Value Ref Range   Hemoglobin A1C 7.0 (A) 4.0 - 5.6 %   HbA1c POC (<> result, manual entry)     HbA1c, POC (prediabetic range)     HbA1c, POC (controlled diabetic range)      No results found.  Depression screen [facility] 2/9 03/18/2019 11/07/2017 04/24/2017  Decreased Interest 0 1 0  Down, Depressed, Hopeless 0 0 0  PHQ - 2 Score 0 1 0    No flowsheet data found.    All questions at time of visit were answered - patient instructed to contact office with any additional concerns or updates.  ER/RTC precautions were reviewed with the patient.  Please note: voice recognition software was used to produce this document, and typos may escape review. Please contact Dr. Sheppard Coil for any needed clarifications.

## 2019-10-07 ENCOUNTER — Encounter: Payer: Self-pay | Admitting: Osteopathic Medicine

## 2019-10-15 ENCOUNTER — Ambulatory Visit: Payer: 59 | Admitting: Family Medicine

## 2019-10-15 ENCOUNTER — Encounter: Payer: Self-pay | Admitting: Family Medicine

## 2019-10-15 ENCOUNTER — Other Ambulatory Visit: Payer: Self-pay

## 2019-10-15 ENCOUNTER — Ambulatory Visit: Payer: 59 | Admitting: Osteopathic Medicine

## 2019-10-15 DIAGNOSIS — I10 Essential (primary) hypertension: Secondary | ICD-10-CM

## 2019-10-15 NOTE — Patient Instructions (Signed)
Blood pressure looks ok today.  Please continue current medications.  If you develop symptoms of dizziness, fatigue, shortness of breath, or chest pain please give [place] a call.

## 2019-10-15 NOTE — Assessment & Plan Note (Signed)
BP low at recent visit for platelelt donation.  Well controlled here and majority of readings at home have been normal.  He is completely asymptomatic currently as well as when he had low BP readings.  I recommended he continue lisinopril at current dose.  Reassurance given.  He will continue to monitor BP at home, if having consistently low readings (<100/60) or develops symptoms he is instructed to let us know.

## 2019-10-15 NOTE — Progress Notes (Signed)
Antonio Rivera - 64 y.o. male MRN [id number]  Date of birth: [birth year]  Subjective Chief Complaint  Patient presents with  . Hypotension    HPI Antonio Rivera is a 64 y.o. male with history of HTN, T2DM, and HLD here today with concern or low blood pressure.  He reports that he went to donate platelets last week and was told the BP was too low to donate and he should f/u with his PCP.  He reports BP was 98/56 with repeat readings that were similar.  He denies any symptoms related to hypotension including dizziness, pre-syncope, fatigue, or nausea.  He has not had chest pain, tightness, edema or palpitations.  He is prescribed lisinopril but has been on this for several years without any recent changes.  He reports good fluid intake.  His blood pressure at home has been normal.  He had one outlier readings with bp of 97/64.    ROS:  A comprehensive ROS was completed and negative except as noted per HPI  Allergies  Allergen Reactions  . Oysters [Shellfish Allergy] Nausea And Vomiting  . Prednisone Anxiety    Past Medical History:  Diagnosis Date  . Diabetes mellitus    type 2  . HNP (herniated nucleus pulposus)    C6--7  . Hyperlipidemia   . Hypertension   . OSA on CPAP 1999    Past Surgical History:  Procedure Laterality Date  . ruptured disk surgery  1999   C6--7  . TONSILLECTOMY  age 34    Social History   Socioeconomic History  . Marital status: Single    Spouse name: Not on file  . Number of children: Not on file  . Years of education: Not on file  . Highest education level: Not on file  Occupational History  . Not on file  Tobacco Use  . Smoking status: Former Smoker    Years: 30.00    Types: Pipe  . Smokeless tobacco: Never Used  Substance and Sexual Activity  . Alcohol use: Yes    Alcohol/week: 1.0 standard drinks    Types: 1 Standard drinks or equivalent per week    Comment: per week  . Drug use: No  . Sexual activity: Not on file  Other Topics  Concern  . Not on file  Social History Narrative  . Not on file   Social Determinants of Health   Financial Resource Strain:   . Difficulty of Paying Living Expenses: Not on file  Food Insecurity:   . Worried About Charity fundraiser in the Last Year: Not on file  . Ran Out of Food in the Last Year: Not on file  Transportation Needs:   . Lack of Transportation (Medical): Not on file  . Lack of Transportation (Non-Medical): Not on file  Physical Activity:   . Days of Exercise per Week: Not on file  . Minutes of Exercise per Session: Not on file  Stress:   . Feeling of Stress : Not on file  Social Connections:   . Frequency of Communication with Friends and Family: Not on file  . Frequency of Social Gatherings with Friends and Family: Not on file  . Attends Religious Services: Not on file  . Active Member of Clubs or Organizations: Not on file  . Attends Archivist Meetings: Not on file  . Marital Status: Not on file    Family History  Problem Relation Age of Onset  . Cancer Mother  skin  . Cancer Father 73       prostate/died age 27   . Cancer Sister        ovarian cancer  . Cancer Brother        skin cancer    Health Maintenance  Topic Date Due  . FOOT EXAM  10/14/2020 (Originally 09/18/2019)  . OPHTHALMOLOGY EXAM  02/05/2020  . HEMOGLOBIN A1C  03/22/2020  . COLONOSCOPY  04/27/2023  . TETANUS/TDAP  07/05/2024  . INFLUENZA VACCINE  Completed  . Hepatitis C Screening  Completed  . HIV Screening  Completed     ----------------------------------------------------------------------------------------------------------------------------------------------------------------------------------------------------------------- Physical Exam BP 126/79   Pulse 76   Temp 98.4 F (36.9 C) (Oral)   Ht 6' (1.829 m)   Wt 254 lb (115.2 kg)   BMI 34.45 kg/m   Physical Exam Constitutional:      Appearance: Normal appearance.  HENT:     Head: Normocephalic  and atraumatic.     Mouth/Throat:     Mouth: Mucous membranes are moist.  Eyes:     General: No scleral icterus. Cardiovascular:     Rate and Rhythm: Normal rate and regular rhythm.     Heart sounds: Normal heart sounds.  Pulmonary:     Effort: Pulmonary effort is normal.     Breath sounds: Normal breath sounds.  Neurological:     General: No focal deficit present.     Mental Status: He is alert.  Psychiatric:        Mood and Affect: Mood normal.        Behavior: Behavior normal.     ------------------------------------------------------------------------------------------------------------------------------------------------------------------------------------------------------------------- Assessment and Plan  Essential hypertension, benign BP low at recent visit for platelelt donation.  Well controlled here and majority of readings at home have been normal.  He is completely asymptomatic currently as well as when he had low BP readings.  I recommended he continue lisinopril at current dose.  Reassurance given.  He will continue to monitor BP at home, if having consistently low readings (<100/60) or develops symptoms he is instructed to let us know.     No orders of the defined types were placed in this encounter.   No follow-ups on file.    This visit occurred during the SARS-CoV-2 public health emergency.  Safety protocols were in place, including screening questions prior to the visit, additional usage of staff PPE, and extensive cleaning of exam room while observing appropriate contact time as indicated for disinfecting solutions.

## 2019-10-25 ENCOUNTER — Other Ambulatory Visit: Payer: Self-pay | Admitting: Family Medicine

## 2019-10-25 DIAGNOSIS — I10 Essential (primary) hypertension: Secondary | ICD-10-CM

## 2019-11-01 ENCOUNTER — Encounter: Payer: Self-pay | Admitting: Osteopathic Medicine

## 2019-11-02 ENCOUNTER — Other Ambulatory Visit: Payer: Self-pay | Admitting: Family Medicine

## 2019-11-02 DIAGNOSIS — E119 Type 2 diabetes mellitus without complications: Secondary | ICD-10-CM

## 2019-11-03 ENCOUNTER — Other Ambulatory Visit: Payer: Self-pay | Admitting: Physician Assistant

## 2019-11-03 ENCOUNTER — Other Ambulatory Visit: Payer: Self-pay | Admitting: Osteopathic Medicine

## 2019-11-03 ENCOUNTER — Other Ambulatory Visit: Payer: Self-pay | Admitting: Family Medicine

## 2019-11-19 ENCOUNTER — Other Ambulatory Visit: Payer: Self-pay

## 2019-11-19 ENCOUNTER — Encounter: Payer: Self-pay | Admitting: Osteopathic Medicine

## 2019-11-19 DIAGNOSIS — E119 Type 2 diabetes mellitus without complications: Secondary | ICD-10-CM

## 2019-11-19 MED ORDER — SITAGLIPTIN PHOSPHATE 100 MG PO TABS: 100.0000 mg | ORAL_TABLET | Freq: Every day | ORAL | 1 refills | Status: DC

## 2019-11-20 ENCOUNTER — Telehealth: Payer: Self-pay | Admitting: Osteopathic Medicine

## 2019-11-20 NOTE — Telephone Encounter (Signed)
Received fax for PA on Januvia sent through cover my meds waiting on determination. - CF

## 2019-12-17 ENCOUNTER — Other Ambulatory Visit: Payer: Self-pay | Admitting: Osteopathic Medicine

## 2019-12-18 NOTE — Telephone Encounter (Signed)
[facility] requesting med refill for famotidine. Rx not listed in active med list. Contacted pt, he did confirmed that he requested a med refill.

## 2020-01-01 NOTE — Telephone Encounter (Signed)
Checked on PA through cover my meds and received authorization for Januvia.    Case Number: [id number] Valid: 11/20/2019 - 11/19/2020. - CF

## 2020-01-05 ENCOUNTER — Other Ambulatory Visit: Payer: Self-pay | Admitting: Osteopathic Medicine

## 2020-01-20 ENCOUNTER — Ambulatory Visit: Payer: 59 | Admitting: Osteopathic Medicine

## 2020-01-22 ENCOUNTER — Other Ambulatory Visit: Payer: Self-pay

## 2020-01-22 ENCOUNTER — Encounter: Payer: Self-pay | Admitting: Osteopathic Medicine

## 2020-01-22 ENCOUNTER — Ambulatory Visit (INDEPENDENT_AMBULATORY_CARE_PROVIDER_SITE_OTHER): Payer: 59 | Admitting: Osteopathic Medicine

## 2020-01-22 VITALS — BP 110/70 | HR 65 | Temp 97.7°F | Wt 247.0 lb

## 2020-01-22 DIAGNOSIS — E119 Type 2 diabetes mellitus without complications: Secondary | ICD-10-CM | POA: Diagnosis not present

## 2020-01-22 DIAGNOSIS — I10 Essential (primary) hypertension: Secondary | ICD-10-CM

## 2020-01-22 DIAGNOSIS — L723 Sebaceous cyst: Secondary | ICD-10-CM | POA: Diagnosis not present

## 2020-01-22 DIAGNOSIS — Z0000 Encounter for general adult medical examination without abnormal findings: Secondary | ICD-10-CM

## 2020-01-22 DIAGNOSIS — N529 Male erectile dysfunction, unspecified: Secondary | ICD-10-CM

## 2020-01-22 DIAGNOSIS — G2581 Restless legs syndrome: Secondary | ICD-10-CM

## 2020-01-22 LAB — POCT GLYCOSYLATED HEMOGLOBIN (HGB A1C): Hemoglobin A1C: 7.3 % — AB (ref 4.0–5.6)

## 2020-01-22 MED ORDER — GABAPENTIN 400 MG PO CAPS: 400.0000 mg | ORAL_CAPSULE | Freq: Every day | ORAL | 0 refills | Status: DC

## 2020-01-22 NOTE — Progress Notes (Signed)
Antonio Rivera is a 63 y.o. male who presents to  [place] [facility]  today, 01/22/20, seeking care for the following:  The primary encounter diagnosis was Type 2 diabetes mellitus without complication, without long-term current use of insulin ([place]). Diagnoses of Annual physical exam, Essential hypertension, benign, Sebaceous cyst, Restless legs, and Erectile dysfunction, unspecified erectile dysfunction type were also pertinent to this visit.  Labs ordered for future visit. Annual physical / preventive care was NOT performed or billed today.    ASSESSMENT & PLAN with other pertinent history/findings:  1. Type 2 diabetes mellitus with diabetic neuropathy, without long-term current use of insulin (HCC) Stable, continue current meds, AC up a bit last check to 7.0 on 09/23/19 from 6.6 on 03/18/19, closer f/u today shows A1C today 01/22/20 up a bit more to 7.3   2. Essential hypertension, benign At goal  BP Readings from Last 3 Encounters:  01/22/20 110/70  10/15/19 126/79  09/23/19 116/73   3. Erectile dysfunction, unspecified erectile dysfunction type Viagra Rx not needed yet, will keep me posted Consider T testing   4. Restless legs Trial Gabapentin started last visit - taking 200-300, occasional 400 mg Advised increase to 400 consistently  Declined iron screening Consider Requip if Gabapentin AE/ineffective  5. Subcutaneous cyst Sebaceous material expressed using instrumentation, pt tolerated procedure well       Orders Placed This Encounter  Procedures   CBC   COMPLETE METABOLIC PANEL WITH GFR   Lipid panel   Hemoglobin A1c   PSA, Total with Reflex to PSA, Free   POCT HgB A1C    Meds ordered this encounter  Medications   gabapentin (NEURONTIN) 400 MG capsule    Sig: Take 1 capsule (400 mg total) by mouth at bedtime. Can add 100 mg capsule if needed to total 500 mg    Dispense:  30 capsule    Refill:  0     Patient Instructions    Work on [employer] and increasing activity!   Sent increased dose Gabapentin for Restless Legs. If not helpful, let me know and I can send alternative   Will recheck A1C in another 4 mos - you'll be due for annual labs around then anyway  Orders are in to come to lab at your convenience at least 2 days prior to your appointment     Meds ordered this encounter  Medications   gabapentin (NEURONTIN) 400 MG capsule    Sig: Take 1 capsule (400 mg total) by mouth at bedtime. Can add 100 mg capsule if needed to total 500 mg    Dispense:  30 capsule    Refill:  0        Follow-up instructions: Return in about 4 months (around 05/23/2020) for ANNUAL (get labs prior to visit, orders are in).              BP 110/70 (BP Location: Left Arm, Patient Position: Sitting, Cuff Size: Normal)    Pulse 65    Temp 97.7 F (36.5 C) (Oral)    Wt 247 lb 0.6 oz (112.1 kg)    BMI 33.50 kg/m   Current Meds  Medication Sig   albuterol (PROVENTIL HFA;VENTOLIN HFA) 108 (90 Base) MCG/ACT inhaler Inhale 1-2 puffs into the lungs every 6 (six) hours as needed for wheezing or shortness of breath.   AMBULATORY NON FORMULARY MEDICATION Glucometer of choice with test test strips, lancets and other supplies.  Test daily  To use as directed Dx: E11.9 Type 2 Diabetes   AMBULATORY NON FORMULARY MEDICATION CPAP mask and supplies as needed.  Also provide 30 day download.  Send to [phone] (fax) Dx: OSA   aspirin 81 MG EC tablet Take 1 tablet (81 mg total) by mouth daily.   atorvastatin (LIPITOR) 20 MG tablet Take 1 tablet (20 mg total) by mouth daily.   cetirizine (ZYRTEC) 10 MG tablet Take 1 tablet (10 mg total) by mouth daily.   famotidine (PEPCID) 20 MG tablet TAKE 2 TABLETS BY MOUTH EVERY DAY   Fish Oil OIL by Does not apply route.   gabapentin (NEURONTIN) 400 MG capsule Take 1 capsule (400 mg total) by mouth at bedtime. Can add 100 mg capsule if needed  to total 500 mg   lisinopril (ZESTRIL) 10 MG tablet TAKE 1 TABLET BY MOUTH EVERY DAY   Multiple Vitamins-Minerals (MULTIVITAMIN PO) Take 1 tablet by mouth every morning.   Nystatin POWD Apply liberally to affected area 2 times per day   Red Yeast Rice 600 MG CAPS 2 caps every evening   sildenafil (VIAGRA) 100 MG tablet Take 0.5-1 tablets (50-100 mg total) by mouth daily as needed for erectile dysfunction.   sitaGLIPtin (JANUVIA) 100 MG tablet Take 1 tablet (100 mg total) by mouth daily.   SYNJARDY 12.12-998 MG TABS TAKE 1 TABLET BY MOUTH TWICE A DAY   [DISCONTINUED] gabapentin (NEURONTIN) 100 MG capsule TAKE 1-4 CAPSULES (100-400 MG TOTAL) BY MOUTH AT BEDTIME.    No results found for this or any previous visit (from the past 72 hour(s)).  No results found.  Depression screen [facility] 2/9 01/22/2020 03/18/2019 11/07/2017  Decreased Interest 0 0 1  Down, Depressed, Hopeless 0 0 0  PHQ - 2 Score 0 0 1  Altered sleeping 0 - -  Tired, decreased energy 0 - -  Change in appetite 0 - -  Feeling bad or failure about yourself  0 - -  Trouble concentrating 0 - -  Moving slowly or fidgety/restless 0 - -  Suicidal thoughts 0 - -  PHQ-9 Score 0 - -  Difficult doing work/chores Not difficult at all - -    GAD 7 : Generalized Anxiety Score 01/22/2020  Nervous, Anxious, on Edge 0  Control/stop worrying 0  Worry too much - different things 0  Trouble relaxing 0  Restless 2  Easily annoyed or irritable 0  Afraid - awful might happen 0  Total GAD 7 Score 2  Anxiety Difficulty Not difficult at all      All questions at time of visit were answered - patient instructed to contact office with any additional concerns or updates.  ER/RTC precautions were reviewed with the patient.  Please note: voice recognition software was used to produce this document, and typos may escape review. Please contact Dr. Sheppard Coil for any needed clarifications.

## 2020-01-22 NOTE — Patient Instructions (Addendum)
·   Work on [employer] and increasing activity!   Sent increased dose Gabapentin for Restless Legs. If not helpful, let me know and I can send alternative   Will recheck A1C in another 4 mos - you'll be due for annual labs around then anyway  Orders are in to come to lab at your convenience at least 2 days prior to your appointment     Meds ordered this encounter  Medications   gabapentin (NEURONTIN) 400 MG capsule    Sig: Take 1 capsule (400 mg total) by mouth at bedtime. Can add 100 mg capsule if needed to total 500 mg    Dispense:  30 capsule    Refill:  0

## 2020-01-25 ENCOUNTER — Other Ambulatory Visit: Payer: Self-pay | Admitting: Physician Assistant

## 2020-01-25 DIAGNOSIS — E119 Type 2 diabetes mellitus without complications: Secondary | ICD-10-CM

## 2020-03-15 ENCOUNTER — Emergency Department
Admission: EM | Admit: 2020-03-15 | Discharge: 2020-03-15 | Disposition: A | Discharge status: Home / Self Care | Payer: 59 | Source: Home / Self Care

## 2020-03-15 DIAGNOSIS — R197 Diarrhea, unspecified: Secondary | ICD-10-CM | POA: Diagnosis not present

## 2020-03-15 LAB — COMPLETE METABOLIC PANEL WITH GFR
AG Ratio: 1.7 (calc) (ref 1.0–2.5)
ALT: 26 U/L (ref 9–46)
AST: 19 U/L (ref 10–35)
Albumin: 4.3 g/dL (ref 3.6–5.1)
Alkaline phosphatase (APISO): 59 U/L (ref 35–144)
BUN: 16 mg/dL (ref 7–25)
CO2: 23 mmol/L (ref 20–32)
Calcium: 9 mg/dL (ref 8.6–10.3)
Chloride: 105 mmol/L (ref 98–110)
Creat: 0.88 mg/dL (ref 0.70–1.25)
GFR, Est African American: 105 mL/min/{1.73_m2} (ref >=60)
GFR, Est Non African American: 91 mL/min/{1.73_m2} (ref >=60)
Globulin: 2.6 g/dL (calc) (ref 1.9–3.7)
Glucose, Bld: 166 mg/dL — ABNORMAL HIGH (ref 65–99)
Potassium: 4.3 mmol/L (ref 3.5–5.3)
Sodium: 136 mmol/L (ref 135–146)
Total Bilirubin: 0.5 mg/dL (ref 0.2–1.2)
Total Protein: 6.9 g/dL (ref 6.1–8.1)

## 2020-03-15 LAB — POCT CBC W AUTO DIFF (K'VILLE URGENT CARE)

## 2020-03-15 NOTE — Discharge Instructions (Addendum)
  Begin Pedialye for about 12 to 18 hours until diarrhea stops, then switch to clear liquids (apple juice, clear grape juice, Jello, etc) for about 12 to 18 hours.  When improved, advance to a [employer] (Bananas, Rice, Applesauce, Toast). Then gradually resume a regular diet when tolerated.  Avoid milk products until well.  When stools become more formed, may take Imodium (loperamide) once or twice daily to decrease stool frequency.  If symptoms become significantly worse during the night or over the weekend, proceed to the local emergency room.  Call to schedule a follow up appointment with your primary care provider in 2-3 days if not improving. You may need a referral to a gastroenterologist (stomach specialist) for further evaluation and treatment of symptoms.

## 2020-03-15 NOTE — ED Triage Notes (Signed)
Patient presents to Urgent Care with complaints of diarrhea since about a week ago. Patient reports it has been waking him up 3-4 times a night, also seems to have irritated an old hemorrhoid from going to the bathroom so often. Pt states the diarrhea is very runny, does have a little more consistency than being watery. Pt has not tried any otc remedies, states he is not sure what to try.

## 2020-03-15 NOTE — ED Provider Notes (Signed)
[name]    CSN: [id number] Arrival date & time: 03/15/20  5284      History   Chief Complaint Chief Complaint  Patient presents with  . Diarrhea    HPI Antonio Rivera is a 64 y.o. male.   HPI  Antonio Rivera is a 64 y.o. male presenting to UC with c/o 1 week of diarrhea, worse at night waking him 3-4 times. Associated rectal pain/irritation from a hemorrhoid.  He describes the consistency of stool as "yellow mucous" to very runny.  Denies blood.  Denies fever, chills, n/v/d. No abdominal pain. No hx of gallbladder issues. He has not noticed if certain food make his symptoms better or worse. No recent medication or supplement changes. No unexpected weight loss. He f/u with his PCP 3-4 times a year for diabetes and has been doing well. No treatments tried PTA. No recent antibiotic use. No recent travel.   Past Medical History:  Diagnosis Date  . Diabetes mellitus    type 2  . HNP (herniated nucleus pulposus)    C6--7  . Hyperlipidemia   . Hypertension   . OSA on CPAP 1999    Patient Active Problem List   Diagnosis Date Noted  . Increased prostate specific antigen (PSA) velocity 11/08/2017  . Genetic testing 08/18/2016  . Lipoma of stomach 07/24/2016  . GERD with esophagitis 07/17/2016  . Dermatofibroma 12/29/2014  . Constipation 09/30/2014  . Muscle cramp 03/24/2014  . Allergic rhinitis 10/06/2013  . Osteoarthritis of both knees 09/08/2013  . Personal history of colonic polyps 04/23/2013  . Type 2 diabetes mellitus with diabetic neuropathy, unspecified ([place]) 10/01/2012  . Hyperlipidemia LDL goal <100 07/03/2012  . Family history of prostate cancer 07/03/2012  . Essential hypertension, benign 08/16/2009  . Obesity (BMI 30.0-34.9) 11/09/2008  . Asthma 11/09/2008  . SLEEP APNEA 10/30/2008    Past Surgical History:  Procedure Laterality Date  . ruptured disk surgery  1999   C6--7  . TONSILLECTOMY  age 21       Home Medications    Prior to  Admission medications   Medication Sig Start Date End Date Taking? Authorizing Provider  SYNJARDY 12.12-998 MG TABS TAKE 1 TABLET BY MOUTH TWICE A DAY 01/26/20   Emeterio Reeve, DO  albuterol (PROVENTIL HFA;VENTOLIN HFA) 108 (90 Base) MCG/ACT inhaler Inhale 1-2 puffs into the lungs every 6 (six) hours as needed for wheezing or shortness of breath. 09/17/18   Gregor Hams, MD  AMBULATORY NON FORMULARY MEDICATION Glucometer of choice with test test strips, lancets and other supplies.  Test daily To use as directed Dx: E11.9 Type 2 Diabetes 10/16/16   Gregor Hams, MD  AMBULATORY NON FORMULARY MEDICATION CPAP mask and supplies as needed.  Also provide 30 day download.  Send to [phone] (fax) Dx: OSA 02/13/18   Gregor Hams, MD  aspirin 81 MG EC tablet Take 1 tablet (81 mg total) by mouth daily. 09/23/19   Emeterio Reeve, DO  atorvastatin (LIPITOR) 20 MG tablet Take 1 tablet (20 mg total) by mouth daily. 09/23/19   Emeterio Reeve, DO  cetirizine (ZYRTEC) 10 MG tablet Take 1 tablet (10 mg total) by mouth daily. 09/23/19   Emeterio Reeve, DO  famotidine (PEPCID) 20 MG tablet TAKE 2 TABLETS BY MOUTH EVERY DAY 12/18/19   Emeterio Reeve, DO  Fish Oil OIL by Does not apply route.    [provider]  gabapentin (NEURONTIN) 400 MG capsule Take 1 capsule (400 mg  total) by mouth at bedtime. Can add 100 mg capsule if needed to total 500 mg 01/22/20   Emeterio Reeve, DO  lisinopril (ZESTRIL) 10 MG tablet TAKE 1 TABLET BY MOUTH EVERY DAY 10/27/19   Emeterio Reeve, DO  Multiple Vitamins-Minerals (MULTIVITAMIN PO) Take 1 tablet by mouth every morning.    [provider]  Nystatin POWD Apply liberally to affected area 2 times per day 09/23/19   Emeterio Reeve, DO  Red Yeast Rice 600 MG CAPS 2 caps every evening 04/02/13   Marcial Pacas, DO  sildenafil (VIAGRA) 100 MG tablet Take 0.5-1 tablets (50-100 mg total) by mouth daily as needed for erectile dysfunction. 09/23/19    Emeterio Reeve, DO  sitaGLIPtin (JANUVIA) 100 MG tablet Take 1 tablet (100 mg total) by mouth daily. 11/19/19   Emeterio Reeve, DO    Family History Family History  Problem Relation Age of Onset  . Cancer Mother        skin  . Cancer Father 6       prostate/died age 109   . Cancer Sister        ovarian cancer  . Cancer Brother        skin cancer    Social History Social History   Tobacco Use  . Smoking status: Former Smoker    Years: 30.00    Types: Pipe  . Smokeless tobacco: Never Used  Substance Use Topics  . Alcohol use: Yes    Alcohol/week: 7.0 standard drinks    Types: 7 Shots of liquor per week    Comment: per week  . Drug use: No     Allergies   Oysters [shellfish allergy] and Prednisone   Review of Systems Review of Systems  Constitutional: Negative for appetite change, chills, fatigue, fever and unexpected weight change.  HENT: Negative for congestion.   Respiratory: Negative for cough.   Gastrointestinal: Positive for diarrhea. Negative for abdominal pain, blood in stool, constipation, nausea and vomiting.  Neurological: Negative for dizziness, light-headedness and headaches.     Physical Exam Triage Vital Signs ED Triage Vitals  Enc Vitals Group     BP 03/15/20 0843 121/75     Pulse Rate 03/15/20 0843 76     Resp 03/15/20 0843 16     Temp 03/15/20 0843 98.2 F (36.8 C)     Temp Source 03/15/20 0843 Oral     SpO2 03/15/20 0843 99 %     Weight --      Height --      Head Circumference --      Peak Flow --      Pain Score 03/15/20 0841 0     Pain Loc --      Pain Edu? --      Excl. in [place]? --    No data found.  Updated Vital Signs BP 121/75 (BP Location: Right Arm)   Pulse 76   Temp 98.2 F (36.8 C) (Oral)   Resp 16   SpO2 99%   Visual Acuity Right Eye Distance:   Left Eye Distance:   Bilateral Distance:    Right Eye Near:   Left Eye Near:    Bilateral Near:     Physical Exam Vitals and nursing note reviewed.    Constitutional:      General: He is not in acute distress.    Appearance: Normal appearance. He is well-developed. He is not ill-appearing, toxic-appearing or diaphoretic.  HENT:     Head: Normocephalic and  atraumatic.     Right Ear: Tympanic membrane and ear canal normal.     Left Ear: Tympanic membrane and ear canal normal.     Nose: Nose normal.     Mouth/Throat:     Mouth: Mucous membranes are moist.  Cardiovascular:     Rate and Rhythm: Normal rate and regular rhythm.  Pulmonary:     Effort: Pulmonary effort is normal. No respiratory distress.     Breath sounds: Normal breath sounds. No stridor. No wheezing, rhonchi or rales.  Abdominal:     General: There is no distension.     Palpations: Abdomen is soft. There is no mass.     Tenderness: There is no abdominal tenderness. There is no right CVA tenderness, left CVA tenderness, guarding or rebound.  Musculoskeletal:        General: Normal range of motion.     Cervical back: Normal range of motion.  Skin:    General: Skin is warm and dry.  Neurological:     Mental Status: He is alert and oriented to person, place, and time.  Psychiatric:        Behavior: Behavior normal.      UC Treatments / Results  Labs (all labs ordered are listed, but only abnormal results are displayed) Labs Reviewed  COMPLETE METABOLIC PANEL WITH GFR  GASTROINTESTINAL PATHOGEN PANEL PCR  POCT CBC W AUTO DIFF ([place])    EKG   Radiology No results found.  Procedures Procedures (including critical care time)  Medications Ordered in UC Medications - No data to display  Initial Impression / Assessment and Plan / UC Course  I have reviewed the triage vital signs and the nursing notes.  Pertinent labs & imaging results that were available during my care of the patient were reviewed by me and considered in my medical decision making (see chart for details).     Pt appears well, NAD  vitals: WNL Benign abdominal  exam. CBC: unremarkable CMP: pending GI PCR: pending, stool sample provided in clinic. Discussed home treatment F/u PCP  Discussed symptoms that warrant emergent care in the ED. AVS givne   Final Clinical Impressions(s) / UC Diagnoses   Final diagnoses:  Diarrhea, unspecified type     Discharge Instructions      Begin Pedialye for about 12 to 18 hours until diarrhea stops, then switch to clear liquids (apple juice, clear grape juice, Jello, etc) for about 12 to 18 hours.  When improved, advance to a [employer] (Bananas, Rice, Applesauce, Toast). Then gradually resume a regular diet when tolerated.  Avoid milk products until well.  When stools become more formed, may take Imodium (loperamide) once or twice daily to decrease stool frequency.  If symptoms become significantly worse during the night or over the weekend, proceed to the local emergency room.  Call to schedule a follow up appointment with your primary care provider in 2-3 days if not improving. You may need a referral to a gastroenterologist (stomach specialist) for further evaluation and treatment of symptoms.     ED Prescriptions    None     PDMP not reviewed this encounter.   Noe Gens, [place] 03/15/20 1048

## 2020-03-18 ENCOUNTER — Other Ambulatory Visit: Payer: Self-pay | Admitting: Osteopathic Medicine

## 2020-03-19 LAB — GASTROINTESTINAL PATHOGEN PANEL PCR
C. difficile Tox A/B, PCR: NOT DETECTED
Campylobacter, PCR: NOT DETECTED
Cryptosporidium, PCR: NOT DETECTED
E coli (ETEC) LT/ST PCR: NOT DETECTED
E coli (STEC) stx1/stx2, PCR: NOT DETECTED
E coli 0157, PCR: NOT DETECTED
Giardia lamblia, PCR: NOT DETECTED
Norovirus, PCR: NOT DETECTED
Rotavirus A, PCR: NOT DETECTED
Salmonella, PCR: NOT DETECTED
Shigella, PCR: NOT DETECTED

## 2020-04-17 ENCOUNTER — Other Ambulatory Visit: Payer: Self-pay | Admitting: Osteopathic Medicine

## 2020-04-17 DIAGNOSIS — I10 Essential (primary) hypertension: Secondary | ICD-10-CM

## 2020-04-24 ENCOUNTER — Other Ambulatory Visit: Payer: Self-pay | Admitting: Osteopathic Medicine

## 2020-04-24 DIAGNOSIS — E119 Type 2 diabetes mellitus without complications: Secondary | ICD-10-CM

## 2020-05-19 ENCOUNTER — Other Ambulatory Visit: Payer: Self-pay | Admitting: Osteopathic Medicine

## 2020-05-19 DIAGNOSIS — E119 Type 2 diabetes mellitus without complications: Secondary | ICD-10-CM

## 2020-05-21 LAB — COMPLETE METABOLIC PANEL WITH GFR
AG Ratio: 1.8 (calc) (ref 1.0–2.5)
ALT: 22 U/L (ref 9–46)
AST: 21 U/L (ref 10–35)
Albumin: 4.3 g/dL (ref 3.6–5.1)
Alkaline phosphatase (APISO): 49 U/L (ref 35–144)
BUN: 16 mg/dL (ref 7–25)
CO2: 23 mmol/L (ref 20–32)
Calcium: 9.2 mg/dL (ref 8.6–10.3)
Chloride: 102 mmol/L (ref 98–110)
Creat: 0.89 mg/dL (ref 0.70–1.25)
GFR, Est African American: 105 mL/min/{1.73_m2} (ref >=60)
GFR, Est Non African American: 90 mL/min/{1.73_m2} (ref >=60)
Globulin: 2.4 g/dL (calc) (ref 1.9–3.7)
Glucose, Bld: 143 mg/dL — ABNORMAL HIGH (ref 65–139)
Potassium: 4.8 mmol/L (ref 3.5–5.3)
Sodium: 136 mmol/L (ref 135–146)
Total Bilirubin: 0.5 mg/dL (ref 0.2–1.2)
Total Protein: 6.7 g/dL (ref 6.1–8.1)

## 2020-05-21 LAB — LIPID PANEL
Cholesterol: 128 mg/dL (ref <200)
HDL: 56 mg/dL (ref >=40)
LDL Cholesterol (Calc): 51 mg/dL (calc)
Non-HDL Cholesterol (Calc): 72 mg/dL (calc) (ref <130)
Total CHOL/HDL Ratio: 2.3 (calc) (ref <5.0)
Triglycerides: 131 mg/dL (ref <150)

## 2020-05-21 LAB — CBC
HCT: 45.6 % (ref 38.5–50.0)
Hemoglobin: 15.2 g/dL (ref 13.2–17.1)
MCH: 29.7 pg (ref 27.0–33.0)
MCHC: 33.3 g/dL (ref 32.0–36.0)
MCV: 89.1 fL (ref 80.0–100.0)
MPV: 10.2 fL (ref 7.5–12.5)
Platelets: 174 10*3/uL (ref 140–400)
RBC: 5.12 10*6/uL (ref 4.20–5.80)
RDW: 14.6 % (ref 11.0–15.0)
WBC: 7.3 10*3/uL (ref 3.8–10.8)

## 2020-05-21 LAB — HEMOGLOBIN A1C
Hgb A1c MFr Bld: 6.9 % of total Hgb — ABNORMAL HIGH (ref <5.7)
Mean Plasma Glucose: 151 (calc)
eAG (mmol/L): 8.4 (calc)

## 2020-05-21 LAB — PSA, TOTAL WITH REFLEX TO PSA, FREE: PSA, Total: 0.7 ng/mL (ref <=4.0)

## 2020-05-26 ENCOUNTER — Ambulatory Visit (INDEPENDENT_AMBULATORY_CARE_PROVIDER_SITE_OTHER): Payer: 59 | Admitting: Osteopathic Medicine

## 2020-05-26 ENCOUNTER — Encounter: Payer: Self-pay | Admitting: Osteopathic Medicine

## 2020-05-26 ENCOUNTER — Other Ambulatory Visit: Payer: Self-pay

## 2020-05-26 VITALS — BP 137/81 | HR 58 | Temp 97.5°F | Wt 257.1 lb

## 2020-05-26 DIAGNOSIS — E119 Type 2 diabetes mellitus without complications: Secondary | ICD-10-CM | POA: Diagnosis not present

## 2020-05-26 DIAGNOSIS — Z0000 Encounter for general adult medical examination without abnormal findings: Secondary | ICD-10-CM | POA: Diagnosis not present

## 2020-05-26 DIAGNOSIS — I10 Essential (primary) hypertension: Secondary | ICD-10-CM

## 2020-05-26 DIAGNOSIS — Z23 Encounter for immunization: Secondary | ICD-10-CM

## 2020-05-26 MED ORDER — SYNJARDY 12.5-1000 MG PO TABS: 1.0000 | ORAL_TABLET | Freq: Two times a day (BID) | ORAL | 3 refills | Status: DC

## 2020-05-26 MED ORDER — FAMOTIDINE 20 MG PO TABS: 40.0000 mg | ORAL_TABLET | Freq: Every day | ORAL | 3 refills | Status: DC

## 2020-05-26 MED ORDER — CETIRIZINE HCL 10 MG PO TABS: 10.0000 mg | ORAL_TABLET | Freq: Every day | ORAL | 3 refills | Status: DC

## 2020-05-26 MED ORDER — LISINOPRIL 10 MG PO TABS: 10.0000 mg | ORAL_TABLET | Freq: Every day | ORAL | 3 refills | Status: DC

## 2020-05-26 MED ORDER — ATORVASTATIN CALCIUM 20 MG PO TABS: 20.0000 mg | ORAL_TABLET | Freq: Every day | ORAL | 3 refills | Status: DC

## 2020-05-26 MED ORDER — SITAGLIPTIN PHOSPHATE 100 MG PO TABS: 100.0000 mg | ORAL_TABLET | Freq: Every day | ORAL | 3 refills | Status: DC

## 2020-05-26 NOTE — Patient Instructions (Addendum)
General Preventive Care  Most recent routine screening labs: already done.   Blood pressure goal 130/80 or less.   Tobacco: don't! Please let me know if you need help quitting!  Alcohol: responsible moderation is ok for most adults - if you have concerns about your alcohol intake, please talk to me!   Exercise: as tolerated to reduce risk of cardiovascular disease and diabetes. Strength training will also prevent osteoporosis.   Mental health: if need for mental health care (medicines, counseling, other), or concerns about moods, please let me know!   Sexual / Reproductive health: if need for STD testing, or if concerns with libido/pain problems, please let me know!   Advanced Directive: Living Will and/or Healthcare Power of Attorney recommended for all adults, regardless of age or health.  Vaccines  Flu vaccine: every fall.   Shingles vaccine: done  Pneumonia vaccines: booster after age 5  Tetanus booster: every 10 years - due 2025  COVID vaccine: [place] screenings   Colon cancer screening: for everyone age 34-75. Colonoscopy due 2024  Prostate cancer screening: PSA blood test age 77-71  Lung cancer screening: CT chest every year for those aged 8 to 48 years who have a 20 pack-year cigarette smoking history and currently smoke or have quit within the past 15 years  Infection screenings  . HIV: recommended screening at least once age 27-65, more often as needed. . Gonorrhea/Chlamydia: screening as needed . Hepatitis C: recommended once for everyone age 79-75 . TB: certain at-risk populations, or depending on work requirements and/or travel history Other . Bone Density Test: recommended for men at age 32 . Abdominal Aortic Aneurysm: screening with ultrasound recommended once for men age 11-75 who have ever smoked 100+ cigarettes

## 2020-05-26 NOTE — Progress Notes (Signed)
Antonio Rivera is a 64 y.o. male who presents to  [place] at [facility]  today, 05/26/20, seeking care for the following:  . Annual physical     ASSESSMENT & PLAN with other pertinent findings:  The primary encounter diagnosis was Annual physical exam. Diagnoses of Need for influenza vaccination, Type 2 diabetes mellitus without complication, without long-term current use of insulin ([place]), and Essential hypertension, benign were also pertinent to this visit.       Patient Instructions  General Preventive Care  Most recent routine screening labs: already done.   Blood pressure goal 130/80 or less.   Tobacco: don't! Please let me know if you need help quitting!  Alcohol: responsible moderation is ok for most adults - if you have concerns about your alcohol intake, please talk to me!   Exercise: as tolerated to reduce risk of cardiovascular disease and diabetes. Strength training will also prevent osteoporosis.   Mental health: if need for mental health care (medicines, counseling, other), or concerns about moods, please let me know!   Sexual / Reproductive health: if need for STD testing, or if concerns with libido/pain problems, please let me know!   Advanced Directive: Living Will and/or Healthcare Power of Attorney recommended for all adults, regardless of age or health.  Vaccines  Flu vaccine: every fall.   Shingles vaccine: done  Pneumonia vaccines: booster after age 81  Tetanus booster: every 10 years - due 2025  COVID vaccine: [place] screenings   Colon cancer screening: for everyone age 65-75. Colonoscopy due 2024  Prostate cancer screening: PSA blood test age 56-71  Lung cancer screening: CT chest every year for those aged 37 to 41 years who have a 20 pack-year cigarette smoking history and currently smoke or have quit within the past 15 years  Infection screenings  . HIV: recommended  screening at least once age 82-65, more often as needed. . Gonorrhea/Chlamydia: screening as needed . Hepatitis C: recommended once for everyone age 14-75 . TB: certain at-risk populations, or depending on work requirements and/or travel history Other . Bone Density Test: recommended for men at age 81 . Abdominal Aortic Aneurysm: screening with ultrasound recommended once for men age 104-75 who have ever smoked 100+ cigarettes   Orders Placed This Encounter  Procedures  . Flu Vaccine QUAD 6+ mos PF IM (Fluarix Quad PF)    Meds ordered this encounter  Medications  . Empagliflozin-metFORMIN HCl (SYNJARDY) 12.12-998 MG TABS    Sig: Take 1 tablet by mouth 2 (two) times daily.    Dispense:  180 tablet    Refill:  3  . lisinopril (ZESTRIL) 10 MG tablet    Sig: Take 1 tablet (10 mg total) by mouth daily.    Dispense:  90 tablet    Refill:  3  . sitaGLIPtin (JANUVIA) 100 MG tablet    Sig: Take 1 tablet (100 mg total) by mouth daily.    Dispense:  90 tablet    Refill:  3  . famotidine (PEPCID) 20 MG tablet    Sig: Take 2 tablets (40 mg total) by mouth daily.    Dispense:  180 tablet    Refill:  3  . cetirizine (ZYRTEC) 10 MG tablet    Sig: Take 1 tablet (10 mg total) by mouth daily.    Dispense:  90 tablet    Refill:  3  . atorvastatin (LIPITOR) 20 MG tablet    Sig: Take  1 tablet (20 mg total) by mouth daily.    Dispense:  90 tablet    Refill:  3     Constitutional:  . VSS, see nurse notes . General Appearance: alert, well-developed, well-nourished, NAD Eyes: Marland Kitchen Normal lids and conjunctive, non-icteric sclera Neck: . No masses, trachea midline . No thyroid enlargement/tenderness/mass appreciated Respiratory: . Normal respiratory effort . Breath sounds normal, no wheeze/rhonchi/rales Cardiovascular: . S1/S2 normal, no murmur/rub/gallop auscultated . No carotid bruit or JVD . Pedal pulse II/IV bilaterally DP and PT . No lower extremity  edema Gastrointestinal: . Nontender, no masses . No hepatomegaly, no splenomegaly . No hernia appreciated Musculoskeletal:  . Gait normal . No clubbing/cyanosis of digits Neurological: . No cranial nerve deficit on limited exam . Motor and sensation intact and symmetric Psychiatric: . Normal judgment/insight . Normal mood and affect   Follow-up instructions: Return in about 4 months (around 09/26/2020) for MONITOR A1C, SEE [place] SOONER IF NEEDED .                                       Social History   Tobacco Use  Smoking Status Former Smoker  . Years: 30.00  . Types: Pipe  Smokeless Tobacco Never Used     BP 137/81 (BP Location: Left Arm, Patient Position: Sitting, Cuff Size: Normal)   Pulse (!) 58   Temp (!) 97.5 F (36.4 C) (Oral)   Wt 257 lb 1.3 oz (116.6 kg)   BMI 34.87 kg/m   Current Meds  Medication Sig  . albuterol (PROVENTIL HFA;VENTOLIN HFA) 108 (90 Base) MCG/ACT inhaler Inhale 1-2 puffs into the lungs every 6 (six) hours as needed for wheezing or shortness of breath.  . AMBULATORY NON FORMULARY MEDICATION Glucometer of choice with test test strips, lancets and other supplies.  Test daily To use as directed Dx: E11.9 Type 2 Diabetes  . AMBULATORY NON FORMULARY MEDICATION CPAP mask and supplies as needed.  Also provide 30 day download.  Send to [phone] (fax) Dx: OSA  . aspirin 81 MG EC tablet Take 1 tablet (81 mg total) by mouth daily.  Marland Kitchen atorvastatin (LIPITOR) 20 MG tablet Take 1 tablet (20 mg total) by mouth daily.  . cetirizine (ZYRTEC) 10 MG tablet Take 1 tablet (10 mg total) by mouth daily.  . Empagliflozin-metFORMIN HCl (SYNJARDY) 12.12-998 MG TABS Take 1 tablet by mouth 2 (two) times daily.  . famotidine (PEPCID) 20 MG tablet Take 2 tablets (40 mg total) by mouth daily.  . Fish Oil OIL by Does not apply route.  . gabapentin (NEURONTIN) 400 MG capsule TAKE 1 CAPSULE (400 MG TOTAL) BY MOUTH AT BEDTIME. CAN ADD  100 MG CAPSULE IF NEEDED TO TOTAL 500 MG  . lisinopril (ZESTRIL) 10 MG tablet Take 1 tablet (10 mg total) by mouth daily.  . Multiple Vitamins-Minerals (MULTIVITAMIN PO) Take 1 tablet by mouth every morning.  Marland Kitchen Nystatin POWD Apply liberally to affected area 2 times per day  . Red Yeast Rice 600 MG CAPS 2 caps every evening  . sildenafil (VIAGRA) 100 MG tablet Take 0.5-1 tablets (50-100 mg total) by mouth daily as needed for erectile dysfunction.  . sitaGLIPtin (JANUVIA) 100 MG tablet Take 1 tablet (100 mg total) by mouth daily.    No results found for this or any previous visit (from the past 72 hour(s)).  No results found.     All questions at  time of visit were answered - patient instructed to contact office with any additional concerns or updates.  ER/RTC precautions were reviewed with the patient as applicable.   Please note: voice recognition software was used to produce this document, and typos may escape review. Please contact Dr. Sheppard Coil for any needed clarifications.

## 2020-06-18 LAB — HM DIABETES EYE EXAM

## 2020-06-19 ENCOUNTER — Other Ambulatory Visit: Payer: Self-pay | Admitting: Osteopathic Medicine

## 2020-06-21 ENCOUNTER — Encounter: Payer: Self-pay | Admitting: Osteopathic Medicine

## 2020-07-20 ENCOUNTER — Encounter: Payer: Self-pay | Admitting: Osteopathic Medicine

## 2020-08-01 ENCOUNTER — Other Ambulatory Visit: Payer: Self-pay | Admitting: Osteopathic Medicine

## 2020-08-27 ENCOUNTER — Encounter: Payer: Self-pay | Admitting: Osteopathic Medicine

## 2020-09-27 ENCOUNTER — Ambulatory Visit: Payer: 59 | Admitting: Osteopathic Medicine

## 2020-11-01 ENCOUNTER — Encounter: Payer: Self-pay | Admitting: Osteopathic Medicine

## 2020-11-01 ENCOUNTER — Other Ambulatory Visit: Payer: Self-pay | Admitting: Osteopathic Medicine

## 2020-11-01 ENCOUNTER — Other Ambulatory Visit: Payer: Self-pay

## 2020-11-01 ENCOUNTER — Ambulatory Visit (INDEPENDENT_AMBULATORY_CARE_PROVIDER_SITE_OTHER): Payer: Medicare Other | Admitting: Osteopathic Medicine

## 2020-11-01 VITALS — BP 139/69 | HR 81 | Temp 97.7°F | Wt 258.1 lb

## 2020-11-01 DIAGNOSIS — E119 Type 2 diabetes mellitus without complications: Secondary | ICD-10-CM

## 2020-11-01 LAB — POCT GLYCOSYLATED HEMOGLOBIN (HGB A1C): Hemoglobin A1C: 7.7 % — AB (ref 4.0–5.6)

## 2020-11-01 MED ORDER — FREESTYLE LIBRE 14 DAY READER DEVI: 99 refills | Status: DC

## 2020-11-01 MED ORDER — FREESTYLE LIBRE 14 DAY SENSOR MISC: 99 refills | Status: DC

## 2020-11-01 MED ORDER — DEXCOM G6 RECEIVER DEVI: 1.0000 | Freq: Every day | 0 refills | Status: DC

## 2020-11-01 MED ORDER — DEXCOM G6 SENSOR MISC: 11 refills | Status: DC

## 2020-11-01 NOTE — Progress Notes (Signed)
Antonio Rivera is a 65 y.o. male who presents to  [place] at [facility]  today, 11/01/20, seeking care for the following:  . DM2 - here to follow A1C, today is 7.7, last check 05/2020 was 6.9, prior to that 01/2020 was 7.3. Rx: empagliflozin-metfromin 12.12-998 mg bid, sitagliptin 100 mg dialy. Also on ACE, statin, ASA. Pt repots there's a lot of room for improvement in diet/exercise habits and would like to avoid Rx if possible     ASSESSMENT & PLAN with other pertinent findings:  The encounter diagnosis was Type 2 diabetes mellitus without complication, without long-term current use of insulin ([place]).   If A1C not improved by next visit strongly consider Ozempic or similar   Patient Instructions  WILL SEND RX FOR NEW GLUCOSE METER    Orders Placed This Encounter  Procedures  . POCT HgB A1C    Meds ordered this encounter  Medications  . DISCONTD: Continuous Blood Gluc Receiver (FREESTYLE LIBRE 14 DAY READER) DEVI    Sig: USE AS DIRECTED    Dispense:  1 each    Refill:  99  . DISCONTD: Continuous Blood Gluc Sensor (FREESTYLE LIBRE 14 DAY SENSOR) MISC    Sig: Use as directed    Dispense:  3 each    Refill:  99     See below for relevant physical exam findings  See below for recent lab and imaging results reviewed  Medications, allergies, PMH, PSH, SocH, FamH reviewed below    Follow-up instructions: Return in about 3 months (around 02/01/2021) for MONITOR A1C, SEE [place] SOONER IF NEEDED. CALL/MESSAGE W/ QUESTIONS.                                        Exam:  BP 139/69 (BP Location: Left Arm, Patient Position: Sitting, Cuff Size: Normal)   Pulse 81   Temp 97.7 F (36.5 C) (Oral)   Wt 258 lb 1.3 oz (117.1 kg)   BMI 35.00 kg/m   Constitutional: VS see above. General Appearance: alert, well-developed, well-nourished, NAD  Neck: No masses, trachea midline.   Respiratory: Normal  respiratory effort. no wheeze, no rhonchi, no rales  Cardiovascular: S1/S2 normal, no murmur, no rub/gallop auscultated. RRR.   Musculoskeletal: Gait normal.   Neurological: Normal balance/coordination. No tremor.  Skin: warm, dry, intact.   Psychiatric: Normal judgment/insight. Normal mood and affect. Oriented x3.   Current Meds  Medication Sig  . albuterol (PROVENTIL HFA;VENTOLIN HFA) 108 (90 Base) MCG/ACT inhaler Inhale 1-2 puffs into the lungs every 6 (six) hours as needed for wheezing or shortness of breath.  . AMBULATORY NON FORMULARY MEDICATION Glucometer of choice with test test strips, lancets and other supplies.  Test daily To use as directed Dx: E11.9 Type 2 Diabetes  . AMBULATORY NON FORMULARY MEDICATION CPAP mask and supplies as needed.  Also provide 30 day download.  Send to [phone] (fax) Dx: OSA  . aspirin 81 MG EC tablet Take 1 tablet (81 mg total) by mouth daily.  Marland Kitchen atorvastatin (LIPITOR) 20 MG tablet Take 1 tablet (20 mg total) by mouth daily.  . cetirizine (ZYRTEC) 10 MG tablet Take 1 tablet (10 mg total) by mouth daily.  . Empagliflozin-metFORMIN HCl (SYNJARDY) 12.12-998 MG TABS Take 1 tablet by mouth 2 (two) times daily.  . famotidine (PEPCID) 20 MG tablet Take 2 tablets (40 mg total) by  mouth daily.  . Fish Oil OIL by Does not apply route.  . gabapentin (NEURONTIN) 400 MG capsule TAKE 1 CAPSULE (400 MG TOTAL) BY MOUTH AT BEDTIME. CAN ADD 100 MG CAPSULE IF NEEDED TO TOTAL 500 MG  . lisinopril (ZESTRIL) 10 MG tablet Take 1 tablet (10 mg total) by mouth daily.  . Multiple Vitamins-Minerals (MULTIVITAMIN PO) Take 1 tablet by mouth every morning.  Marland Kitchen Nystatin POWD Apply liberally to affected area 2 times per day  . Red Yeast Rice 600 MG CAPS 2 caps every evening  . sildenafil (VIAGRA) 100 MG tablet Take 0.5-1 tablets (50-100 mg total) by mouth daily as needed for erectile dysfunction.  . sitaGLIPtin (JANUVIA) 100 MG tablet Take 1 tablet (100 mg total) by mouth  daily.  . [DISCONTINUED] Continuous Blood Gluc Receiver (FREESTYLE LIBRE 14 DAY READER) DEVI USE AS DIRECTED  . [DISCONTINUED] Continuous Blood Gluc Sensor (FREESTYLE LIBRE 14 DAY SENSOR) MISC Use as directed    Allergies  Allergen Reactions  . Oysters [Shellfish Allergy] Nausea And Vomiting  . Prednisone Anxiety    Patient Active Problem List   Diagnosis Date Noted  . Increased prostate specific antigen (PSA) velocity 11/08/2017  . Genetic testing 08/18/2016  . Lipoma of stomach 07/24/2016  . GERD with esophagitis 07/17/2016  . Dermatofibroma 12/29/2014  . Constipation 09/30/2014  . Muscle cramp 03/24/2014  . Allergic rhinitis 10/06/2013  . Osteoarthritis of both knees 09/08/2013  . Personal history of colonic polyps 04/23/2013  . Type 2 diabetes mellitus with diabetic neuropathy, unspecified ([place]) 10/01/2012  . Hyperlipidemia LDL goal <100 07/03/2012  . Family history of prostate cancer 07/03/2012  . Essential hypertension, benign 08/16/2009  . Obesity (BMI 30.0-34.9) 11/09/2008  . Asthma 11/09/2008  . SLEEP APNEA 10/30/2008    Family History  Problem Relation Age of Onset  . Cancer Mother        skin  . Cancer Father 46       prostate/died age 66   . Cancer Sister        ovarian cancer  . Cancer Brother        skin cancer    Social History   Tobacco Use  Smoking Status Former Smoker  . Years: 30.00  . Types: Pipe  . Quit date: 08/07/1989  . Years since quitting: 31.2  Smokeless Tobacco Never Used    Past Surgical History:  Procedure Laterality Date  . ruptured disk surgery  1999   C6--7  . TONSILLECTOMY  age 27    Immunization History  Administered Date(s) Administered  . Influenza Whole 05/24/2009, 06/14/2012, 05/21/2013  . Influenza,inj,Quad PF,6+ Mos 03/30/2014, 03/31/2015, 04/07/2016, 04/24/2017, 05/16/2018, 05/19/2019, 05/26/2020  . Pneumococcal Polysaccharide-23 04/02/2013  . Td 08/08/2007  . Tdap 07/05/2014  . Zoster Recombinat (Shingrix)  03/18/2019, 05/19/2019    Recent Results (from the past 2160 hour(s))  POCT HgB A1C     Status: Abnormal   Collection Time: 11/01/20  2:45 PM  Result Value Ref Range   Hemoglobin A1C 7.7 (A) 4.0 - 5.6 %   HbA1c POC (<> result, manual entry)     HbA1c, POC (prediabetic range)     HbA1c, POC (controlled diabetic range)      No results found.     All questions at time of visit were answered - patient instructed to contact office with any additional concerns or updates. ER/RTC precautions were reviewed with the patient as applicable.   Please note: manual typing as well as voice recognition software  may have been used to produce this document - typos may escape review. Please contact Dr. Sheppard Coil for any needed clarifications.

## 2020-11-01 NOTE — Telephone Encounter (Signed)
Freestyle Libre not covered by insurance. preference is Dexcom.   Replaced with dexcom and sent to pharmacy

## 2020-11-01 NOTE — Patient Instructions (Signed)
WILL SEND RX FOR NEW GLUCOSE METER

## 2020-11-05 ENCOUNTER — Other Ambulatory Visit: Payer: Self-pay | Admitting: Osteopathic Medicine

## 2020-11-05 DIAGNOSIS — E119 Type 2 diabetes mellitus without complications: Secondary | ICD-10-CM

## 2020-11-08 NOTE — Telephone Encounter (Signed)
Per [facility] - "Alternative Requested: PLEASE CHANGE ALL RXS FOR THIS TO FREESTYLE LIBRE THIS IS WHAT INSURANCE COVERS". Thanks in advance.

## 2020-11-22 ENCOUNTER — Other Ambulatory Visit: Payer: Self-pay | Admitting: Osteopathic Medicine

## 2020-12-13 ENCOUNTER — Other Ambulatory Visit: Payer: Self-pay

## 2020-12-13 ENCOUNTER — Encounter: Payer: Self-pay | Admitting: Sports Medicine

## 2020-12-13 ENCOUNTER — Ambulatory Visit (INDEPENDENT_AMBULATORY_CARE_PROVIDER_SITE_OTHER): Payer: Medicare Other | Admitting: Sports Medicine

## 2020-12-13 ENCOUNTER — Ambulatory Visit (INDEPENDENT_AMBULATORY_CARE_PROVIDER_SITE_OTHER): Payer: Medicare Other

## 2020-12-13 DIAGNOSIS — M545 Low back pain, unspecified: Secondary | ICD-10-CM

## 2020-12-13 DIAGNOSIS — M25551 Pain in right hip: Secondary | ICD-10-CM

## 2020-12-13 DIAGNOSIS — M5136 Other intervertebral disc degeneration, lumbar region: Secondary | ICD-10-CM | POA: Insufficient documentation

## 2020-12-13 DIAGNOSIS — M51369 Other intervertebral disc degeneration, lumbar region without mention of lumbar back pain or lower extremity pain: Secondary | ICD-10-CM | POA: Insufficient documentation

## 2020-12-13 MED ORDER — MELOXICAM 15 MG PO TABS: ORAL_TABLET | ORAL | 3 refills | Status: DC

## 2020-12-13 MED ORDER — TRAMADOL HCL 50 MG PO TABS: 50.0000 mg | ORAL_TABLET | Freq: Three times a day (TID) | ORAL | 0 refills | Status: DC | PRN

## 2020-12-13 MED ORDER — DEXAMETHASONE 4 MG PO TABS: 4.0000 mg | ORAL_TABLET | Freq: Three times a day (TID) | ORAL | 0 refills | Status: DC

## 2020-12-13 NOTE — Assessment & Plan Note (Signed)
Is an 65 year old male, he has history of lumbar radiculitis that responded well about 4 years ago to Decadron, meloxicam, home rehab. This is an exacerbation of a chronic process. More recently he is having pain in his right low back with radiation into the groin and down to the anterior thigh. On exam he does have reproduction of pain with internal rotation of the hip. He likely has a radicular process going on but also right hip osteoarthritis, today I think his primary pain generator is from his right hip joint, adding right hip x-rays, 5 days of Decadron, meloxicam. Tramadol for pain at night. Home rehab exercises. Return to see me in 2 weeks, consider hip joint injection if no better.

## 2020-12-13 NOTE — Progress Notes (Signed)
    Procedures performed today:    None.  Independent interpretation of notes and tests performed by another provider:   None.  Brief History, Exam, Impression, and Recommendations:    Right hip pain Is an 65 year old male, he has history of lumbar radiculitis that responded well about 4 years ago to Decadron, meloxicam, home rehab. This is an exacerbation of a chronic process. More recently he is having pain in his right low back with radiation into the groin and down to the anterior thigh. On exam he does have reproduction of pain with internal rotation of the hip. He likely has a radicular process going on but also right hip osteoarthritis, today I think his primary pain generator is from his right hip joint, adding right hip x-rays, 5 days of Decadron, meloxicam. Tramadol for pain at night. Home rehab exercises. Return to see me in 2 weeks, consider hip joint injection if no better.    ___________________________________________ Gwen Her. Dianah Field, M.D., ABFM., CAQSM. Primary Care and [place] Instructor of [employer] of [facility] [employer]

## 2020-12-27 ENCOUNTER — Other Ambulatory Visit: Payer: Self-pay

## 2020-12-27 ENCOUNTER — Ambulatory Visit (INDEPENDENT_AMBULATORY_CARE_PROVIDER_SITE_OTHER): Payer: Medicare Other | Admitting: Sports Medicine

## 2020-12-27 DIAGNOSIS — M51369 Other intervertebral disc degeneration, lumbar region without mention of lumbar back pain or lower extremity pain: Secondary | ICD-10-CM

## 2020-12-27 DIAGNOSIS — M5136 Other intervertebral disc degeneration, lumbar region: Secondary | ICD-10-CM | POA: Diagnosis not present

## 2020-12-27 MED ORDER — GABAPENTIN 800 MG PO TABS: 800.0000 mg | ORAL_TABLET | Freq: Every day | ORAL | 3 refills | Status: DC

## 2020-12-27 MED ORDER — ACETAMINOPHEN 500 MG PO TABS: ORAL_TABLET | ORAL | Status: DC

## 2020-12-27 NOTE — Progress Notes (Signed)
    Procedures performed today:    None.  Independent interpretation of notes and tests performed by another provider:   Lumbar spine x-rays show mild to moderate DDD and moderate to severe lower lumbar facet arthritis.  Brief History, Exam, Impression, and Recommendations:    Lumbar degenerative disc disease Antonio Rivera returns, he is a pleasant 65 year old male with chronic right-sided low back and hip pain. We initially suspected a hip joint pain generator, after Decadron and tramadol he really has no more pain in his groin, no pain with internal rotation of the hip. He does endorse more pain in the right side of the low back with radiation down the anterior lateral right thigh to the knee and occasionally past. He has improved since the last visit, he does note the meloxicam does not help at all, discontinue this per Extra strength Tylenol seems to work better than arthritis strength, I am happy for him to do this at the 1000 mg 2-3 times a day. He is on gabapentin for her milligrams nightly which seems to help his nocturnal symptoms, we will bump this up to 800 mg nightly. Return to see me in 4 to 6 weeks, further medication management versus MRI for epidural planning if not better.    ___________________________________________ Gwen Her. Dianah Field, M.D., ABFM., CAQSM. Primary Care and [place] Instructor of [place] of [facility] of [employer]

## 2020-12-27 NOTE — Assessment & Plan Note (Signed)
Antonio Rivera returns, he is a pleasant 65 year old male with chronic right-sided low back and hip pain. We initially suspected a hip joint pain generator, after Decadron and tramadol he really has no more pain in his groin, no pain with internal rotation of the hip. He does endorse more pain in the right side of the low back with radiation down the anterior lateral right thigh to the knee and occasionally past. He has improved since the last visit, he does note the meloxicam does not help at all, discontinue this per Extra strength Tylenol seems to work better than arthritis strength, I am happy for him to do this at the 1000 mg 2-3 times a day. He is on gabapentin for her milligrams nightly which seems to help his nocturnal symptoms, we will bump this up to 800 mg nightly. Return to see me in 4 to 6 weeks, further medication management versus MRI for epidural planning if not better.

## 2021-02-01 ENCOUNTER — Ambulatory Visit: Payer: Medicare Other | Admitting: Osteopathic Medicine

## 2021-02-14 ENCOUNTER — Ambulatory Visit (INDEPENDENT_AMBULATORY_CARE_PROVIDER_SITE_OTHER): Payer: Medicare Other | Admitting: Sports Medicine

## 2021-02-14 ENCOUNTER — Encounter: Payer: Self-pay | Admitting: Sports Medicine

## 2021-02-14 ENCOUNTER — Other Ambulatory Visit: Payer: Self-pay

## 2021-02-14 DIAGNOSIS — M5136 Other intervertebral disc degeneration, lumbar region: Secondary | ICD-10-CM

## 2021-02-14 DIAGNOSIS — M51369 Other intervertebral disc degeneration, lumbar region without mention of lumbar back pain or lower extremity pain: Secondary | ICD-10-CM

## 2021-02-14 NOTE — Assessment & Plan Note (Signed)
Usman returns, he is a very pleasant 65 year old male, chronic right-sided low back pain, he has multilevel lumbar DDD on x-rays. At the last visit we bumped him up from 400 to 800 mg of gabapentin nightly. He has noted really good improvements in his discomfort, occasional pain when laying flat at night. I am going to give him the option to do 1200 or 1600 mg of gabapentin at night, he will play around with the dosing and see what works best for him. Return as needed.

## 2021-02-14 NOTE — Progress Notes (Signed)
    Procedures performed today:    None.  Independent interpretation of notes and tests performed by another provider:   None.  Brief History, Exam, Impression, and Recommendations:    Lumbar degenerative disc disease Manu returns, he is a very pleasant 65 year old male, chronic right-sided low back pain, he has multilevel lumbar DDD on x-rays. At the last visit we bumped him up from 400 to 800 mg of gabapentin nightly. He has noted really good improvements in his discomfort, occasional pain when laying flat at night. I am going to give him the option to do 1200 or 1600 mg of gabapentin at night, he will play around with the dosing and see what works best for him. Return as needed.    ___________________________________________ Gwen Her. Dianah Field, M.D., ABFM., CAQSM. Primary Care and [place] Instructor of [employer] of [facility] [employer]

## 2021-02-15 ENCOUNTER — Ambulatory Visit: Payer: Medicare Other | Admitting: Sports Medicine

## 2021-03-02 ENCOUNTER — Other Ambulatory Visit: Payer: Self-pay | Admitting: Osteopathic Medicine

## 2021-03-18 DIAGNOSIS — E119 Type 2 diabetes mellitus without complications: Secondary | ICD-10-CM | POA: Diagnosis not present

## 2021-03-18 DIAGNOSIS — Z0100 Encounter for examination of eyes and vision without abnormal findings: Secondary | ICD-10-CM | POA: Diagnosis not present

## 2021-03-29 LAB — HM DIABETES EYE EXAM

## 2021-03-30 ENCOUNTER — Other Ambulatory Visit: Payer: Self-pay | Admitting: Osteopathic Medicine

## 2021-04-15 ENCOUNTER — Ambulatory Visit (INDEPENDENT_AMBULATORY_CARE_PROVIDER_SITE_OTHER): Payer: Medicare Other | Admitting: Family Medicine

## 2021-04-15 VITALS — BP 128/77 | HR 78 | Ht 72.0 in | Wt 257.0 lb

## 2021-04-15 DIAGNOSIS — Z23 Encounter for immunization: Secondary | ICD-10-CM

## 2021-04-15 DIAGNOSIS — Z0000 Encounter for general adult medical examination without abnormal findings: Secondary | ICD-10-CM

## 2021-04-15 NOTE — Patient Instructions (Addendum)
[place] Maintenance Summary and Written Plan of Care  Antonio Rivera ,  Thank you for allowing me to perform your Medicare Annual Wellness Visit and for your ongoing commitment to your health.   Health Maintenance & Immunization History Health Maintenance  Topic Date Due   PNA vac Low Risk Adult (1 of 2 - PCV13) 07/29/2020   COVID-19 Vaccine (1) 05/01/2021 (Originally 01/28/1957)   FOOT EXAM  04/15/2022 (Originally 09/18/2019)   HEMOGLOBIN A1C  05/04/2021   OPHTHALMOLOGY EXAM  06/18/2021   COLONOSCOPY (Pts 45-77yr Insurance coverage will need to be confirmed)  04/27/2023   TETANUS/TDAP  07/05/2024   INFLUENZA VACCINE  Completed   Hepatitis C Screening  Completed   HIV Screening  Completed   Zoster Vaccines- Shingrix  Completed   HPV VACCINES  Aged Out   Immunization History  Administered Date(s) Administered   Fluad Quad(high Dose 65+) 04/15/2021   Influenza Whole 05/24/2009, 06/14/2012, 05/21/2013   Influenza,inj,Quad PF,6+ Mos 03/30/2014, 03/31/2015, 04/07/2016, 04/24/2017, 05/16/2018, 05/19/2019, 05/26/2020   PNEUMOCOCCAL CONJUGATE-20 04/15/2021   Pneumococcal Polysaccharide-23 04/02/2013   Td 08/08/2007   Tdap 07/05/2014   Zoster Recombinat (Shingrix) 03/18/2019, 05/19/2019    These are the patient goals that we discussed:  Goals Addressed               This Visit's Progress     Patient Stated (pt-stated)        04/15/2021 AWV Goal: Diabetes Management  Patient will maintain an A1C level below 8.0 Patient will not develop any diabetic foot complications Patient will not experience any hypoglycemic episodes over the next 3 months Patient will notify our office of any CBG readings outside of the provider recommended range by calling [phone] will adhere to provider recommendations for diabetes management  Patient Self Management Activities take all medications as prescribed and report any negative side effects monitor and  record blood sugar readings as directed adhere to a low carbohydrate diet that incorporates lean proteins, vegetables, whole grains, low glycemic fruits check feet daily noting any sores, cracks, injuries, or callous formations see PCP or podiatrist if he notices any changes in his legs, feet, or toenails Patient will visit PCP and have an A1C level checked every 3 to 6 months as directed  have a yearly eye exam to monitor for vascular changes associated with diabetes and will request that the report be sent to his pcp.  consult with his PCP regarding any changes in his health or new or worsening symptoms          This is a list of Health Maintenance Items that are overdue or due now: Pneumococcal vaccine  Influenza vaccine   Orders/Referrals Placed Today: Orders Placed This Encounter  Procedures   Pneumococcal conjugate vaccine 20-valent (Prevnar 20)   Flu Vaccine QUAD High Dose(Fluad)   (Contact our referral department at [phone] you have not spoken with someone about your referral appointment within the next 5 days)    Follow-up Plan Follow-up with AEmeterio Reeve DO as planned Please bring your eye exam records to the appointment next week. Foot exam at your next in-office visit.  Medicare wellness visit in one year.  AVS printed and given to the patient.      Health Maintenance, Male Adopting a healthy lifestyle and getting preventive care are important in promoting health and wellness. Ask your health care provider about: The right schedule for you to have regular tests and exams. Things you can do  on your own to prevent diseases and keep yourself healthy. What should I know about diet, weight, and exercise? Eat a healthy diet  Eat a diet that includes plenty of vegetables, fruits, low-fat dairy products, and lean protein. Do not eat a lot of foods that are high in solid fats, added sugars, or sodium. Maintain a healthy weight Body mass index (BMI) is a  measurement that can be used to identify possible weight problems. It estimates body fat based on height and weight. Your health care provider can help determine your BMI and help you achieve or maintain a healthy weight. Get regular exercise Get regular exercise. This is one of the most important things you can do for your health. Most adults should: Exercise for at least 150 minutes each week. The exercise should increase your heart rate and make you sweat (moderate-intensity exercise). Do strengthening exercises at least twice a week. This is in addition to the moderate-intensity exercise. Spend less time sitting. Even light physical activity can be beneficial. Watch cholesterol and blood lipids Have your blood tested for lipids and cholesterol at 66 years of age, then have this test every 5 years. You may need to have your cholesterol levels checked more often if: Your lipid or cholesterol levels are high. You are older than 66 years of age. You are at high risk for heart disease. What should I know about cancer screening? Many types of cancers can be detected early and may often be prevented. Depending on your health history and family history, you may need to have cancer screening at various ages. This may include screening for: Colorectal cancer. Prostate cancer. Skin cancer. Lung cancer. What should I know about heart disease, diabetes, and high blood pressure? Blood pressure and heart disease High blood pressure causes heart disease and increases the risk of stroke. This is more likely to develop in people who have high blood pressure readings, are of African descent, or are overweight. Talk with your health care provider about your target blood pressure readings. Have your blood pressure checked: Every 3-5 years if you are 13-75 years of age. Every year if you are 71 years old or older. If you are between the ages of 12 and 96 and are a current or former smoker, ask your health  care provider if you should have a one-time screening for abdominal aortic aneurysm (AAA). Diabetes Have regular diabetes screenings. This checks your fasting blood sugar level. Have the screening done: Once every three years after age 46 if you are at a normal weight and have a low risk for diabetes. More often and at a younger age if you are overweight or have a high risk for diabetes. What should I know about preventing infection? Hepatitis B If you have a higher risk for hepatitis B, you should be screened for this virus. Talk with your health care provider to find out if you are at risk for hepatitis B infection. Hepatitis C Blood testing is recommended for: Everyone born from [birth year] through 1966. Anyone with known risk factors for hepatitis C. Sexually transmitted infections (STIs) You should be screened each year for STIs, including gonorrhea and chlamydia, if: You are sexually active and are younger than 66 years of age. You are older than 66 years of age and your health care provider tells you that you are at risk for this type of infection. Your sexual activity has changed since you were last screened, and you are at increased risk for chlamydia or  gonorrhea. Ask your health care provider if you are at risk. Ask your health care provider about whether you are at high risk for HIV. Your health care provider may recommend a prescription medicine to help prevent HIV infection. If you choose to take medicine to prevent HIV, you should first get tested for HIV. You should then be tested every 3 months for as long as you are taking the medicine. Follow these instructions at home: Lifestyle Do not use any products that contain nicotine or tobacco, such as cigarettes, e-cigarettes, and chewing tobacco. If you need help quitting, ask your health care provider. Do not use street drugs. Do not share needles. Ask your health care provider for help if you need support or information about quitting  drugs. Alcohol use Do not drink alcohol if your health care provider tells you not to drink. If you drink alcohol: Limit how much you have to 0-2 drinks a day. Be aware of how much alcohol is in your drink. In the [place] one drink equals one 12 oz bottle of beer (355 mL), one 5 oz glass of wine (148 mL), or one 1 oz glass of hard liquor (44 mL). General instructions Schedule regular health, dental, and eye exams. Stay current with your vaccines. Tell your health care provider if: You often feel depressed. You have ever been abused or do not feel safe at home. Summary Adopting a healthy lifestyle and getting preventive care are important in promoting health and wellness. Follow your health care provider's instructions about healthy diet, exercising, and getting tested or screened for diseases. Follow your health care provider's instructions on monitoring your cholesterol and blood pressure. This information is not intended to replace advice given to you by your health care provider. Make sure you discuss any questions you have with your health care provider. Document Revised: 10/01/2020 Document Reviewed: 07/17/2018 Elsevier Patient Education  2022 [employer].

## 2021-04-15 NOTE — Progress Notes (Signed)
MEDICARE ANNUAL WELLNESS VISIT  04/15/2021  Subjective:  Antonio Rivera is a 65 y.o. male patient of Antonio Reeve, DO who had a Medicare Annual Wellness Visit today. Antonio Rivera is Working full time and lives with their partner. he has 3 children. he reports that he is socially active and does interact with friends/family regularly. he is minimally physically active and enjoys woodworking.  Patient Care Team: Antonio Reeve, DO as PCP - General (Osteopathic Medicine)  Advanced Directives 04/15/2021  Does Patient Have a Medical Advance Directive? No  Would patient like information on creating a medical advance directive? No - Patient declined    Hospital Utilization Over the Past 12 Months: # of hospitalizations or ER visits: 0 # of surgeries: 0  Review of Systems    Patient reports that his overall health is unchanged when compared to last year.  Review of Systems: History obtained from chart review and the patient  All other systems negative.  Pain Assessment Pain : No/denies pain     Current Medications & Allergies (verified) Allergies as of 04/15/2021       Reactions   Oysters [shellfish Allergy] Nausea And Vomiting   Prednisone Anxiety        Medication List        Accurate as of April 15, 2021  3:59 PM. If you have any questions, ask your nurse or doctor.          acetaminophen 500 MG tablet Commonly known as: TYLENOL 1000 g p.o. twice daily to 3 times daily   albuterol 108 (90 Base) MCG/ACT inhaler Commonly known as: VENTOLIN HFA Inhale 1-2 puffs into the lungs every 6 (six) hours as needed for wheezing or shortness of breath.   AMBULATORY NON FORMULARY MEDICATION Glucometer of choice with test test strips, lancets and other supplies.  Test daily To use as directed Dx: E11.9 Type 2 Diabetes   AMBULATORY NON FORMULARY MEDICATION CPAP mask and supplies as needed.  Also provide 30 day download.  Send to [phone] (fax) Dx: OSA    aspirin 81 MG EC tablet Take 1 tablet (81 mg total) by mouth daily.   atorvastatin 20 MG tablet Commonly known as: LIPITOR Take 1 tablet (20 mg total) by mouth daily.   cetirizine 10 MG tablet Commonly known as: ZYRTEC Take 1 tablet (10 mg total) by mouth daily.   Dexcom G6 Receiver Devi USE AS DIRECTED   Dexcom G6 Sensor Misc Replace sensor every 10 days.   Dexcom G6 Transmitter Misc 1 each by Does not apply route daily.   famotidine 20 MG tablet Commonly known as: PEPCID Take 2 tablets (40 mg total) by mouth daily.   Fish Oil Oil by Does not apply route.   gabapentin 800 MG tablet Commonly known as: NEURONTIN 1200 to 1600 mg oral nightly (1.5 tabs to 2 tabs oral nightly)   lisinopril 10 MG tablet Commonly known as: ZESTRIL Take 1 tablet (10 mg total) by mouth daily.   MULTIVITAMIN PO Take 1 tablet by mouth every morning.   Nystatin Powd Apply liberally to affected area 2 times per day   Red Yeast Rice 600 MG Caps 2 caps every evening   sildenafil 100 MG tablet Commonly known as: Viagra Take 0.5-1 tablets (50-100 mg total) by mouth daily as needed for erectile dysfunction.   sitaGLIPtin 100 MG tablet Commonly known as: Januvia Take 1 tablet (100 mg total) by mouth daily.   Synjardy 12.12-998 MG Tabs Generic drug: Empagliflozin-metFORMIN HCl Take 1  tablet by mouth 2 (two) times daily.        History (reviewed): Past Medical History:  Diagnosis Date   Diabetes mellitus    type 2   HNP (herniated nucleus pulposus)    C6--7   Hyperlipidemia    Hypertension    OSA on CPAP 1999   Past Surgical History:  Procedure Laterality Date   ruptured disk surgery  1999   C6--7   TONSILLECTOMY  age 53   Family History  Problem Relation Age of Onset   Cancer Mother        skin   Cancer Father 23       prostate/died age 76    Cancer Sister        ovarian cancer   Cancer Brother        skin cancer   Social History   Socioeconomic History    Marital status: Significant Other    Spouse name: Antonio Rivera   Number of children: 3   Years of education: 13   Highest education level: High school graduate  Occupational History   Occupation: Full-time Engineer, manufacturing systems  Tobacco Use   Smoking status: Former    Types: Pipe    Quit date: 08/07/1989    Years since quitting: 31.7   Smokeless tobacco: Never  Substance and Sexual Activity   Alcohol use: Yes    Alcohol/week: 7.0 standard drinks    Types: 7 Shots of liquor per week    Comment: per week   Drug use: No   Sexual activity: Not on file  Other Topics Concern   Not on file  Social History Narrative   Lives with his significant other. He has three children. He is still working full time. He enjoys wood working.    Social Determinants of Health   Financial Resource Strain: Low Risk    Difficulty of Paying Living Expenses: Not hard at all  Food Insecurity: No Food Insecurity   Worried About Charity fundraiser in the Last Year: Never true   [place] in the Last Year: Never true  Transportation Needs: No Transportation Needs   Lack of Transportation (Medical): No   Lack of Transportation (Non-Medical): No  Physical Activity: Inactive   Days of Exercise per Week: 0 days   Minutes of Exercise per Session: 0 min  Stress: No Stress Concern Present   Feeling of Stress : Not at all  Social Connections: Moderately Integrated   Frequency of Communication with Friends and Family: More than three times a week   Frequency of Social Gatherings with Friends and Family: Twice a week   Attends Religious Services: More than 4 times per year   Active Member of [employer] or Organizations: No   Attends Archivist Meetings: Never   Marital Status: Living with partner    Activities of Daily Living In your present state of health, do you have any difficulty performing the following activities: 04/15/2021  Hearing? N  Vision? N  Difficulty concentrating or making decisions? N   Walking or climbing stairs? N  Dressing or bathing? N  Doing errands, shopping? N  Preparing Food and eating ? N  Using the Toilet? N  In the past six months, have you accidently leaked urine? N  Do you have problems with loss of bowel control? N  Managing your Medications? N  Managing your Finances? N  Housekeeping or managing your Housekeeping? N  Some recent data might be hidden  Patient Education/Literacy How often do you need to have someone help you when you read instructions, pamphlets, or other written materials from your doctor or pharmacy?: 1 - Never What is the last grade level you completed in school?: 12th grade  Exercise Current Exercise Habits: The patient has a physically strenuous job, but has no regular exercise apart from work., Exercise limited by: None identified  Diet Patient reports consuming  1-2  meals a day and 1-2 snack(s) a day Patient reports that his primary diet is: Regular Patient reports that she does have regular access to food.   Depression Screen PHQ 2/9 Scores 04/15/2021 05/26/2020 01/22/2020 03/18/2019 11/07/2017 04/24/2017 07/17/2016  PHQ - 2 Score 0 1 0 0 1 0 0  PHQ- 9 Score - - 0 - - - -     Fall Risk Fall Risk  04/15/2021 05/26/2020 01/22/2020 11/07/2017 07/17/2016  Falls in the past year? 1 1 0 No No  Number falls in past yr: 0 0 - - -  Injury with Fall? 0 0 0 - -  Risk for fall due to : No Fall Risks - - - -  Follow up Falls evaluation completed;Education provided;Falls prevention discussed - - - -     Objective:   BP 128/77 (BP Location: Right Arm, Patient Position: Sitting, Cuff Size: Normal)   Pulse 78   Ht 6' (1.829 m)   Wt 257 lb (116.6 kg)   SpO2 98%   BMI 34.86 kg/m   Last Weight  Most recent update: 04/15/2021  3:05 PM    Weight  116.6 kg (257 lb)             Body mass index is 34.86 kg/m.  Hearing/Vision  [place] did not have difficulty with hearing/understanding during the face-to-face interview [place] did not have  difficulty with his vision during the face-to-face interview Reports that he has had a formal eye exam by an eye care professional within the past year Reports that he has not had a formal hearing evaluation within the past year  Cognitive Function: 6CIT Screen 04/15/2021  What Year? 0 points  What month? 0 points  What time? 0 points  Count back from 20 0 points  Months in reverse 0 points  Repeat phrase 2 points  Total Score 2    Normal Cognitive Function Screening: Yes (Normal:0-7, Significant for Dysfunction: >8)  Immunization & Health Maintenance Record Immunization History  Administered Date(s) Administered   Fluad Quad(high Dose 65+) 04/15/2021   Influenza Whole 05/24/2009, 06/14/2012, 05/21/2013   Influenza,inj,Quad PF,6+ Mos 03/30/2014, 03/31/2015, 04/07/2016, 04/24/2017, 05/16/2018, 05/19/2019, 05/26/2020   PNEUMOCOCCAL CONJUGATE-20 04/15/2021   Pneumococcal Polysaccharide-23 04/02/2013   Td 08/08/2007   Tdap 07/05/2014   Zoster Recombinat (Shingrix) 03/18/2019, 05/19/2019    Health Maintenance  Topic Date Due   PNA vac Low Risk Adult (1 of 2 - PCV13) 07/29/2020   COVID-19 Vaccine (1) 05/01/2021 (Originally 01/28/1957)   FOOT EXAM  04/15/2022 (Originally 09/18/2019)   HEMOGLOBIN A1C  05/04/2021   OPHTHALMOLOGY EXAM  06/18/2021   COLONOSCOPY (Pts 45-71yr Insurance coverage will need to be confirmed)  04/27/2023   TETANUS/TDAP  07/05/2024   INFLUENZA VACCINE  Completed   Hepatitis C Screening  Completed   HIV Screening  Completed   Zoster Vaccines- Shingrix  Completed   HPV VACCINES  Aged Out       Assessment  This is a routine wellness examination for [employer]  Health Maintenance: Due or Overdue Health Maintenance Due  Topic Date Due   PNA vac Low Risk Adult (1 of 2 - PCV13) 07/29/2020     Ebony Cargo does not need a referral for Community Assistance: Care Management:   no Social Work:    no Prescription  Assistance:  no Nutrition/Diabetes Education:  no   Plan:  Personalized Goals  Goals Addressed               This Visit's Progress     Patient Stated (pt-stated)        04/15/2021 AWV Goal: Diabetes Management  Patient will maintain an A1C level below 8.0 Patient will not develop any diabetic foot complications Patient will not experience any hypoglycemic episodes over the next 3 months Patient will notify our office of any CBG readings outside of the provider recommended range by calling [phone] Patient will adhere to provider recommendations for diabetes management  Patient Self Management Activities take all medications as prescribed and report any negative side effects monitor and record blood sugar readings as directed adhere to a low carbohydrate diet that incorporates lean proteins, vegetables, whole grains, low glycemic fruits check feet daily noting any sores, cracks, injuries, or callous formations see PCP or podiatrist if he notices any changes in his legs, feet, or toenails Patient will visit PCP and have an A1C level checked every 3 to 6 months as directed  have a yearly eye exam to monitor for vascular changes associated with diabetes and will request that the report be sent to his pcp.  consult with his PCP regarding any changes in his health or new or worsening symptoms        Personalized Health Maintenance & Screening Recommendations  Pneumococcal vaccine  Influenza vaccine Covid vaccine - Patient declined the covid vaccines.  Lung Cancer Screening Recommended: no (Low Dose CT Chest recommended if Age 20-80 years, 30 pack-year currently smoking OR have quit w/in past 15 years) Hepatitis C Screening recommended: no HIV Screening recommended: no  Advanced Directives: Written information was not given per the patient's request.  Referrals & Orders Orders Placed This Encounter  Procedures   Pneumococcal conjugate vaccine 20-valent (Prevnar 20)    Flu Vaccine QUAD High Dose(Fluad)     Follow-up Plan Follow-up with Antonio Reeve, DO as planned Please bring your eye exam records to the appointment next week. Foot exam at your next in-office visit.  Medicare wellness visit in one year.  AVS printed and given to the patient.   I have personally reviewed and noted the following in the patient's chart:   Medical and social history Use of alcohol, tobacco or illicit drugs  Current medications and supplements Functional ability and status Nutritional status Physical activity Advanced directives List of other physicians Hospitalizations, surgeries, and ER visits in previous 12 months Vitals Screenings to include cognitive, depression, and falls Referrals and appointments  In addition, I have reviewed and discussed with patient certain preventive protocols, quality metrics, and best practice recommendations. A written personalized care plan for preventive services as well as general preventive health recommendations were provided to patient.     Tinnie Gens, RN  04/15/2021

## 2021-04-19 ENCOUNTER — Other Ambulatory Visit: Payer: Self-pay | Admitting: Sports Medicine

## 2021-04-19 DIAGNOSIS — M25551 Pain in right hip: Secondary | ICD-10-CM

## 2021-04-21 ENCOUNTER — Ambulatory Visit: Payer: Medicare Other | Admitting: Osteopathic Medicine

## 2021-04-26 ENCOUNTER — Encounter: Payer: Self-pay | Admitting: Osteopathic Medicine

## 2021-04-26 ENCOUNTER — Other Ambulatory Visit: Payer: Self-pay

## 2021-04-26 ENCOUNTER — Ambulatory Visit (INDEPENDENT_AMBULATORY_CARE_PROVIDER_SITE_OTHER): Payer: Medicare Other | Admitting: Osteopathic Medicine

## 2021-04-26 ENCOUNTER — Other Ambulatory Visit: Payer: Self-pay | Admitting: Osteopathic Medicine

## 2021-04-26 VITALS — BP 118/61 | HR 69 | Temp 97.4°F | Ht 72.0 in | Wt 249.0 lb

## 2021-04-26 DIAGNOSIS — Z860100 Personal history of colon polyps, unspecified: Secondary | ICD-10-CM

## 2021-04-26 DIAGNOSIS — M5136 Other intervertebral disc degeneration, lumbar region: Secondary | ICD-10-CM | POA: Diagnosis not present

## 2021-04-26 DIAGNOSIS — J4520 Mild intermittent asthma, uncomplicated: Secondary | ICD-10-CM | POA: Diagnosis not present

## 2021-04-26 DIAGNOSIS — E119 Type 2 diabetes mellitus without complications: Secondary | ICD-10-CM

## 2021-04-26 DIAGNOSIS — Z8042 Family history of malignant neoplasm of prostate: Secondary | ICD-10-CM

## 2021-04-26 DIAGNOSIS — E1169 Type 2 diabetes mellitus with other specified complication: Secondary | ICD-10-CM | POA: Diagnosis not present

## 2021-04-26 DIAGNOSIS — K2100 Gastro-esophageal reflux disease with esophagitis, without bleeding: Secondary | ICD-10-CM

## 2021-04-26 DIAGNOSIS — G4733 Obstructive sleep apnea (adult) (pediatric): Secondary | ICD-10-CM | POA: Diagnosis not present

## 2021-04-26 DIAGNOSIS — E1159 Type 2 diabetes mellitus with other circulatory complications: Secondary | ICD-10-CM

## 2021-04-26 DIAGNOSIS — I152 Hypertension secondary to endocrine disorders: Secondary | ICD-10-CM | POA: Diagnosis not present

## 2021-04-26 DIAGNOSIS — Z0000 Encounter for general adult medical examination without abnormal findings: Secondary | ICD-10-CM

## 2021-04-26 DIAGNOSIS — M51369 Other intervertebral disc degeneration, lumbar region without mention of lumbar back pain or lower extremity pain: Secondary | ICD-10-CM

## 2021-04-26 DIAGNOSIS — E785 Hyperlipidemia, unspecified: Secondary | ICD-10-CM | POA: Diagnosis not present

## 2021-04-26 DIAGNOSIS — Z125 Encounter for screening for malignant neoplasm of prostate: Secondary | ICD-10-CM

## 2021-04-26 LAB — POCT GLYCOSYLATED HEMOGLOBIN (HGB A1C): Hemoglobin A1C: 8.1 % — AB (ref 4.0–5.6)

## 2021-04-26 MED ORDER — DEXCOM G6 TRANSMITTER MISC: 1.0000 | Freq: Every day | 99 refills | Status: DC

## 2021-04-26 MED ORDER — SYNJARDY 12.5-1000 MG PO TABS: 1.0000 | ORAL_TABLET | Freq: Two times a day (BID) | ORAL | 3 refills | Status: DC

## 2021-04-26 MED ORDER — GABAPENTIN 800 MG PO TABS: ORAL_TABLET | ORAL | 3 refills | Status: DC

## 2021-04-26 MED ORDER — DEXCOM G6 SENSOR MISC: 99 refills | Status: DC

## 2021-04-26 MED ORDER — LISINOPRIL 10 MG PO TABS: 10.0000 mg | ORAL_TABLET | Freq: Every day | ORAL | 3 refills | Status: DC

## 2021-04-26 MED ORDER — SILDENAFIL CITRATE 100 MG PO TABS: 50.0000 mg | ORAL_TABLET | Freq: Every day | ORAL | 11 refills | Status: DC | PRN

## 2021-04-26 MED ORDER — DEXCOM G6 RECEIVER DEVI: 99 refills | Status: DC

## 2021-04-26 MED ORDER — FAMOTIDINE 20 MG PO TABS: 40.0000 mg | ORAL_TABLET | Freq: Every day | ORAL | 3 refills | Status: DC

## 2021-04-26 MED ORDER — CETIRIZINE HCL 10 MG PO TABS: 10.0000 mg | ORAL_TABLET | Freq: Every day | ORAL | 3 refills | Status: DC

## 2021-04-26 MED ORDER — SITAGLIPTIN PHOSPHATE 100 MG PO TABS: 100.0000 mg | ORAL_TABLET | Freq: Every day | ORAL | 3 refills | Status: DC

## 2021-04-26 MED ORDER — ATORVASTATIN CALCIUM 20 MG PO TABS: 20.0000 mg | ORAL_TABLET | Freq: Every day | ORAL | 3 refills | Status: DC

## 2021-04-26 NOTE — Progress Notes (Signed)
Antonio Rivera is a 65 y.o. male who presents to  [place] at [facility]  today, 04/26/21, seeking care for the following:  DM2 - here to follow A1C, today 04/26/21 is 8.1 was checked by [facility] wellness check was last night 7.5, last check 11/01/20 was 7.7, 05/2020 was 6.9, prior to that 01/2020 was 7.3.  Rx: empagliflozin-metfromin 12.12-998 mg bid, sitagliptin 100 mg dialy. Also on ACE, statin, ASA.     ASSESSMENT & PLAN with other pertinent findings:  The primary encounter diagnosis was Type 2 diabetes mellitus without complication, without long-term current use of insulin ([place]). Diagnoses of Lumbar degenerative disc disease, Hypertension associated with diabetes ([place]), Gastroesophageal reflux disease with esophagitis without hemorrhage, Obstructive sleep apnea, Hyperlipidemia associated with type 2 diabetes mellitus ([place]), Mild intermittent asthma without complication, Personal history of colonic polyps, Prostate cancer screening, Annual physical exam, and Family history of prostate cancer were also pertinent to this visit.  1. Type 2 diabetes mellitus without complication, without long-term current use of insulin (HCC) A1C up  a bit but pt reports was fine w/ Medicare nurse yesterday, will confirm w/ blood draw   2. Lumbar degenerative disc disease Gabapentin doing ok, following w/ sports med prn   3. Hypertension associated with diabetes ([place]) BP Readings from Last 3 Encounters:  04/26/21 118/61  04/15/21 128/77  11/01/20 139/69   4. Gastroesophageal reflux disease with esophagitis without hemorrhage Continue meds  5. Obstructive sleep apnea Using and benefitting from CPAPA nightly   6. Hyperlipidemia associated with type 2 diabetes mellitus ([place]) Labs pending On statin  7. Mild intermittent asthma without complication Continue current resuce inhaler prn   8. Personal history of colonic polyps UTD  screening  9. Prostate cancer screening 11. Family history of prostate cancer PSA standing orders, pt reports his dad's levels went from normal to aggressive cancer w/in 3 mos and he was told he'd need checked every 3-6 mos.     Patient Instructions  A1C TODAY IS UP A BIT TO 8.1, GOAL IS 7.5 OR LESS!  LAST LABS DONE 05/2020, LABS ORDERED TODAY FOR FASTING BLOOD DRAW (CHOLESTEROL ETC)  SENT CONTINUOUS GLUCOSE MONITOR - LET us KNOW IF ANY ISSUES W/ COVERAGE FOR THIS REFILLS SENT FOR A YEAR!    Orders Placed This Encounter  Procedures   CBC with Differential/Platelet   COMPLETE METABOLIC PANEL WITH GFR   Lipid panel   PSA, Total with Reflex to PSA, Free   Hemoglobin A1c   PSA   POCT glycosylated hemoglobin (Hb A1C)    Meds ordered this encounter  Medications   atorvastatin (LIPITOR) 20 MG tablet    Sig: Take 1 tablet (20 mg total) by mouth daily.    Dispense:  90 tablet    Refill:  3   cetirizine (ZYRTEC) 10 MG tablet    Sig: Take 1 tablet (10 mg total) by mouth daily.    Dispense:  90 tablet    Refill:  3   Empagliflozin-metFORMIN HCl (SYNJARDY) 12.12-998 MG TABS    Sig: Take 1 tablet by mouth 2 (two) times daily.    Dispense:  180 tablet    Refill:  3   famotidine (PEPCID) 20 MG tablet    Sig: Take 2 tablets (40 mg total) by mouth daily.    Dispense:  180 tablet    Refill:  3   gabapentin (NEURONTIN) 800 MG tablet    Sig: 1200 to 1600 mg  oral nightly (1.5 tabs to 2 tabs oral nightly)    Dispense:  90 tablet    Refill:  3   lisinopril (ZESTRIL) 10 MG tablet    Sig: Take 1 tablet (10 mg total) by mouth daily.    Dispense:  90 tablet    Refill:  3   sildenafil (VIAGRA) 100 MG tablet    Sig: Take 0.5-1 tablets (50-100 mg total) by mouth daily as needed for erectile dysfunction.    Dispense:  10 tablet    Refill:  11   sitaGLIPtin (JANUVIA) 100 MG tablet    Sig: Take 1 tablet (100 mg total) by mouth daily.    Dispense:  90 tablet    Refill:  3   Continuous Blood  Gluc Transmit (DEXCOM G6 TRANSMITTER) MISC    Sig: 1 each by Does not apply route daily.    Dispense:  1 each    Refill:  99   Continuous Blood Gluc Sensor (DEXCOM G6 SENSOR) MISC    Sig: Replace sensor every 10 days.    Dispense:  9 each    Refill:  99   Continuous Blood Gluc Receiver ([place]) DEVI    Sig: USE AS DIRECTED    Dispense:  1 each    Refill:  99     See below for relevant physical exam findings  See below for recent lab and imaging results reviewed  Medications, allergies, PMH, PSH, SocH, FamH reviewed below    Follow-up instructions: Return in about 6 months (around 10/24/2021) for FOLLOW UP DIABETS, BLOOD PRESSURE, ETC! ESTABLISH W/ DR CODY MATTHEWS . May need f/u sooner pending labs                                         Exam:  BP 118/61   Pulse 69   Temp (!) 97.4 F (36.3 C)   Ht 6' (1.829 m)   Wt 249 lb (112.9 kg)   SpO2 98%   BMI 33.77 kg/m  Constitutional: VS see above. General Appearance: alert, well-developed, well-nourished, NAD Neck: No masses, trachea midline.  Respiratory: Normal respiratory effort. no wheeze, no rhonchi, no rales Cardiovascular: S1/S2 normal, no murmur, no rub/gallop auscultated. RRR.  Musculoskeletal: Gait normal.  Neurological: Normal balance/coordination. No tremor. Skin: warm, dry, intact.  Psychiatric: Normal judgment/insight. Normal mood and affect. Oriented x3.   Current Meds  Medication Sig   acetaminophen (TYLENOL) 500 MG tablet 1000 g p.o. twice daily to 3 times daily   albuterol (PROVENTIL HFA;VENTOLIN HFA) 108 (90 Base) MCG/ACT inhaler Inhale 1-2 puffs into the lungs every 6 (six) hours as needed for wheezing or shortness of breath.   AMBULATORY NON FORMULARY MEDICATION Glucometer of choice with test test strips, lancets and other supplies.  Test daily To use as directed Dx: E11.9 Type 2 Diabetes   AMBULATORY NON FORMULARY MEDICATION CPAP mask and supplies as  needed.  Also provide 30 day download.  Send to [phone] (fax) Dx: OSA   aspirin 81 MG EC tablet Take 1 tablet (81 mg total) by mouth daily.   atorvastatin (LIPITOR) 20 MG tablet Take 1 tablet (20 mg total) by mouth daily.   cetirizine (ZYRTEC) 10 MG tablet Take 1 tablet (10 mg total) by mouth daily.   Continuous Blood Gluc Receiver (DEXCOM G6 RECEIVER) DEVI USE AS DIRECTED   Continuous Blood Gluc Sensor (DEXCOM G6 SENSOR)  MISC Replace sensor every 10 days.   Continuous Blood Gluc Transmit (DEXCOM G6 TRANSMITTER) MISC 1 each by Does not apply route daily.   Empagliflozin-metFORMIN HCl (SYNJARDY) 12.12-998 MG TABS Take 1 tablet by mouth 2 (two) times daily.   famotidine (PEPCID) 20 MG tablet Take 2 tablets (40 mg total) by mouth daily.   Fish Oil OIL by Does not apply route.   gabapentin (NEURONTIN) 800 MG tablet 1200 to 1600 mg oral nightly (1.5 tabs to 2 tabs oral nightly)   lisinopril (ZESTRIL) 10 MG tablet Take 1 tablet (10 mg total) by mouth daily.   Multiple Vitamins-Minerals (MULTIVITAMIN PO) Take 1 tablet by mouth every morning.   Nystatin POWD Apply liberally to affected area 2 times per day   Red Yeast Rice 600 MG CAPS 2 caps every evening   sildenafil (VIAGRA) 100 MG tablet Take 0.5-1 tablets (50-100 mg total) by mouth daily as needed for erectile dysfunction.   sitaGLIPtin (JANUVIA) 100 MG tablet Take 1 tablet (100 mg total) by mouth daily.    Allergies  Allergen Reactions   Oysters [Shellfish Allergy] Nausea And Vomiting   Prednisone Anxiety    Patient Active Problem List   Diagnosis Date Noted   Lumbar degenerative disc disease 12/13/2020   Increased prostate specific antigen (PSA) velocity 11/08/2017   Genetic testing 08/18/2016   Lipoma of stomach 07/24/2016   GERD with esophagitis 07/17/2016   Dermatofibroma 12/29/2014   Constipation 09/30/2014   Muscle cramp 03/24/2014   Allergic rhinitis 10/06/2013   Osteoarthritis of both knees 09/08/2013   Personal  history of colonic polyps 04/23/2013   Type 2 diabetes mellitus with diabetic neuropathy, unspecified ([place]) 10/01/2012   Hyperlipidemia LDL goal <100 07/03/2012   Family history of prostate cancer 07/03/2012   Essential hypertension, benign 08/16/2009   Obesity (BMI 30.0-34.9) 11/09/2008   Asthma 11/09/2008   Obstructive sleep apnea 10/30/2008    Family History  Problem Relation Age of Onset   Cancer Mother        skin   Cancer Father 51       prostate/died age 57    Cancer Sister        ovarian cancer   Cancer Brother        skin cancer    Social History   Tobacco Use  Smoking Status Former   Types: Pipe   Quit date: 08/07/1989   Years since quitting: 31.7  Smokeless Tobacco Never    Past Surgical History:  Procedure Laterality Date   ruptured disk surgery  1999   C6--7   TONSILLECTOMY  age 89    Immunization History  Administered Date(s) Administered   Fluad Quad(high Dose 65+) 04/15/2021   Influenza Whole 05/24/2009, 06/14/2012, 05/21/2013   Influenza,inj,Quad PF,6+ Mos 03/30/2014, 03/31/2015, 04/07/2016, 04/24/2017, 05/16/2018, 05/19/2019, 05/26/2020   PNEUMOCOCCAL CONJUGATE-20 04/15/2021   Pneumococcal Polysaccharide-23 04/02/2013   Td 08/08/2007   Tdap 07/05/2014   Zoster Recombinat (Shingrix) 03/18/2019, 05/19/2019    Recent Results (from the past 2160 hour(s))  HM DIABETES EYE EXAM     Status: None   Collection Time: 03/29/21 12:00 AM  Result Value Ref Range   HM Diabetic Eye Exam No Retinopathy No Retinopathy  POCT glycosylated hemoglobin (Hb A1C)     Status: Abnormal   Collection Time: 04/26/21  7:28 AM  Result Value Ref Range   Hemoglobin A1C 8.1 (A) 4.0 - 5.6 %   HbA1c POC (<> result, manual entry)     HbA1c, POC (prediabetic  range)     HbA1c, POC (controlled diabetic range)      No results found.     All questions at time of visit were answered - patient instructed to contact office with any additional concerns or updates. ER/RTC  precautions were reviewed with the patient as applicable.   Please note: manual typing as well as voice recognition software may have been used to produce this document - typos may escape review. Please contact Dr. Sheppard Coil for any needed clarifications.

## 2021-04-26 NOTE — Patient Instructions (Addendum)
A1C TODAY IS UP A BIT TO 8.1, GOAL IS 7.5 OR LESS!  LAST LABS DONE 05/2020, LABS ORDERED TODAY FOR FASTING BLOOD DRAW (CHOLESTEROL ETC)  SENT CONTINUOUS GLUCOSE MONITOR - LET us KNOW IF ANY ISSUES W/ COVERAGE FOR THIS REFILLS SENT FOR A YEAR!

## 2021-04-27 LAB — CBC WITH DIFFERENTIAL/PLATELET
Absolute Monocytes: 410 cells/uL (ref 200–950)
Basophils Absolute: 32 cells/uL (ref 0–200)
Basophils Relative: 0.5 %
Eosinophils Absolute: 147 cells/uL (ref 15–500)
Eosinophils Relative: 2.3 %
HCT: 49.7 % (ref 38.5–50.0)
Hemoglobin: 16.2 g/dL (ref 13.2–17.1)
Lymphs Abs: 2502 cells/uL (ref 850–3900)
MCH: 29.8 pg (ref 27.0–33.0)
MCHC: 32.6 g/dL (ref 32.0–36.0)
MCV: 91.5 fL (ref 80.0–100.0)
MPV: 10 fL (ref 7.5–12.5)
Monocytes Relative: 6.4 %
Neutro Abs: 3309 cells/uL (ref 1500–7800)
Neutrophils Relative %: 51.7 %
Platelets: 233 10*3/uL (ref 140–400)
RBC: 5.43 10*6/uL (ref 4.20–5.80)
RDW: 13.5 % (ref 11.0–15.0)
Total Lymphocyte: 39.1 %
WBC: 6.4 10*3/uL (ref 3.8–10.8)

## 2021-04-27 LAB — COMPLETE METABOLIC PANEL WITH GFR
AG Ratio: 1.8 (calc) (ref 1.0–2.5)
ALT: 32 U/L (ref 9–46)
AST: 24 U/L (ref 10–35)
Albumin: 4.4 g/dL (ref 3.6–5.1)
Alkaline phosphatase (APISO): 54 U/L (ref 35–144)
BUN: 19 mg/dL (ref 7–25)
CO2: 23 mmol/L (ref 20–32)
Calcium: 9.6 mg/dL (ref 8.6–10.3)
Chloride: 103 mmol/L (ref 98–110)
Creat: 0.83 mg/dL (ref 0.70–1.35)
Globulin: 2.5 g/dL (calc) (ref 1.9–3.7)
Glucose, Bld: 189 mg/dL — ABNORMAL HIGH (ref 65–99)
Potassium: 4.7 mmol/L (ref 3.5–5.3)
Sodium: 137 mmol/L (ref 135–146)
Total Bilirubin: 0.5 mg/dL (ref 0.2–1.2)
Total Protein: 6.9 g/dL (ref 6.1–8.1)
eGFR: 97 mL/min/{1.73_m2} (ref >=60)

## 2021-04-27 LAB — LIPID PANEL
Cholesterol: 167 mg/dL (ref <200)
HDL: 42 mg/dL (ref >=40)
LDL Cholesterol (Calc): 85 mg/dL (calc)
Non-HDL Cholesterol (Calc): 125 mg/dL (calc) (ref <130)
Total CHOL/HDL Ratio: 4 (calc) (ref <5.0)
Triglycerides: 331 mg/dL — ABNORMAL HIGH (ref <150)

## 2021-04-27 LAB — HEMOGLOBIN A1C
Hgb A1c MFr Bld: 8.2 % of total Hgb — ABNORMAL HIGH (ref <5.7)
Mean Plasma Glucose: 189 mg/dL
eAG (mmol/L): 10.4 mmol/L

## 2021-04-27 LAB — PSA, TOTAL WITH REFLEX TO PSA, FREE: PSA, Total: 1 ng/mL (ref <=4.0)

## 2021-04-28 ENCOUNTER — Other Ambulatory Visit: Payer: Self-pay | Admitting: Osteopathic Medicine

## 2021-08-18 ENCOUNTER — Other Ambulatory Visit: Payer: Self-pay

## 2021-08-18 DIAGNOSIS — E119 Type 2 diabetes mellitus without complications: Secondary | ICD-10-CM

## 2021-08-18 MED ORDER — DEXCOM G6 TRANSMITTER MISC: 1.0000 | Freq: Every day | 99 refills | Status: DC

## 2021-08-18 MED ORDER — DEXCOM G6 RECEIVER DEVI: 99 refills | Status: DC

## 2021-08-18 MED ORDER — DEXCOM G6 SENSOR MISC: 99 refills | Status: DC

## 2021-08-18 NOTE — Telephone Encounter (Signed)
Printed and left up front.

## 2021-08-22 ENCOUNTER — Telehealth: Payer: Self-pay

## 2021-08-22 ENCOUNTER — Other Ambulatory Visit: Payer: Self-pay

## 2021-08-22 DIAGNOSIS — E119 Type 2 diabetes mellitus without complications: Secondary | ICD-10-CM

## 2021-08-22 MED ORDER — DEXCOM G6 TRANSMITTER MISC: 1.0000 | Freq: Every day | 99 refills | Status: DC

## 2021-08-22 MED ORDER — DEXCOM G6 SENSOR MISC: 99 refills | Status: DC

## 2021-08-22 MED ORDER — DEXCOM G6 RECEIVER DEVI: 99 refills | Status: DC

## 2021-08-22 NOTE — Telephone Encounter (Signed)
Medication: Continuous Blood Gluc Sensor (DEXCOM G6 SENSOR) MISC Prior authorization submitted via CoverMyMeds on 08/22/2021 PA submission pending

## 2021-08-22 NOTE — Telephone Encounter (Signed)
Medication: Continuous Blood Gluc Receiver ([place]) DEVI Prior authorization submitted via CoverMyMeds on 08/22/2021 PA submission pending

## 2021-08-22 NOTE — Telephone Encounter (Signed)
Medication: Continuous Blood Gluc Transmit (DEXCOM G6 TRANSMITTER) MISC Prior authorization submitted via CoverMyMeds on 08/22/2021 PA submission pending

## 2021-09-07 ENCOUNTER — Other Ambulatory Visit: Payer: Self-pay

## 2021-09-07 DIAGNOSIS — E119 Type 2 diabetes mellitus without complications: Secondary | ICD-10-CM

## 2021-09-07 MED ORDER — SYNJARDY 12.5-1000 MG PO TABS: 1.0000 | ORAL_TABLET | Freq: Two times a day (BID) | ORAL | 0 refills | Status: DC

## 2021-09-07 NOTE — Telephone Encounter (Signed)
Patient has a TOC appt with Dr. Zigmund Daniel on 09/27/2021 but will run out of his Synjardy prior to the appt date. He is requesting a small supply yo get him through to his appt. #[phone] sent to [facility] [address].

## 2021-09-08 ENCOUNTER — Other Ambulatory Visit: Payer: Self-pay

## 2021-09-08 DIAGNOSIS — E119 Type 2 diabetes mellitus without complications: Secondary | ICD-10-CM

## 2021-09-08 DIAGNOSIS — E1159 Type 2 diabetes mellitus with other circulatory complications: Secondary | ICD-10-CM

## 2021-09-08 DIAGNOSIS — I152 Hypertension secondary to endocrine disorders: Secondary | ICD-10-CM

## 2021-09-08 MED ORDER — FAMOTIDINE 20 MG PO TABS: 40.0000 mg | ORAL_TABLET | Freq: Every day | ORAL | 2 refills | Status: DC

## 2021-09-08 MED ORDER — SYNJARDY 12.5-1000 MG PO TABS: 1.0000 | ORAL_TABLET | Freq: Two times a day (BID) | ORAL | 2 refills | Status: DC

## 2021-09-08 MED ORDER — ATORVASTATIN CALCIUM 20 MG PO TABS: 20.0000 mg | ORAL_TABLET | Freq: Every day | ORAL | 2 refills | Status: DC

## 2021-09-08 MED ORDER — LISINOPRIL 10 MG PO TABS: 10.0000 mg | ORAL_TABLET | Freq: Every day | ORAL | 2 refills | Status: DC

## 2021-09-08 MED ORDER — CETIRIZINE HCL 10 MG PO TABS: 10.0000 mg | ORAL_TABLET | Freq: Every day | ORAL | 2 refills | Status: DC

## 2021-09-08 MED ORDER — SITAGLIPTIN PHOSPHATE 100 MG PO TABS: 100.0000 mg | ORAL_TABLET | Freq: Every day | ORAL | 2 refills | Status: DC

## 2021-09-27 ENCOUNTER — Ambulatory Visit (INDEPENDENT_AMBULATORY_CARE_PROVIDER_SITE_OTHER): Payer: Medicare Other | Admitting: Family Medicine

## 2021-09-27 ENCOUNTER — Encounter: Payer: Self-pay | Admitting: Family Medicine

## 2021-09-27 ENCOUNTER — Other Ambulatory Visit: Payer: Self-pay

## 2021-09-27 VITALS — BP 132/70 | HR 76 | Temp 98.5°F | Ht 72.0 in | Wt 257.0 lb

## 2021-09-27 DIAGNOSIS — E785 Hyperlipidemia, unspecified: Secondary | ICD-10-CM | POA: Diagnosis not present

## 2021-09-27 DIAGNOSIS — I10 Essential (primary) hypertension: Secondary | ICD-10-CM

## 2021-09-27 DIAGNOSIS — E1140 Type 2 diabetes mellitus with diabetic neuropathy, unspecified: Secondary | ICD-10-CM

## 2021-09-27 DIAGNOSIS — E119 Type 2 diabetes mellitus without complications: Secondary | ICD-10-CM

## 2021-09-27 LAB — POCT GLYCOSYLATED HEMOGLOBIN (HGB A1C): HbA1c, POC (controlled diabetic range): 7.9 % — AB (ref 0.0–7.0)

## 2021-09-27 MED ORDER — FREESTYLE LIBRE 3 SENSOR MISC
1.0000 | 3 refills | Status: DC
Start: 2021-09-27 — End: 2021-09-27

## 2021-09-27 MED ORDER — SYNJARDY 12.5-1000 MG PO TABS: 1.0000 | ORAL_TABLET | Freq: Two times a day (BID) | ORAL | 0 refills | Status: DC

## 2021-09-27 MED ORDER — ALBUTEROL SULFATE HFA 108 (90 BASE) MCG/ACT IN AERS: 1.0000 | INHALATION_SPRAY | Freq: Four times a day (QID) | RESPIRATORY_TRACT | 3 refills | Status: DC | PRN

## 2021-09-27 MED ORDER — FREESTYLE LIBRE 3 SENSOR MISC: 1.0000 | 3 refills | Status: DC

## 2021-09-29 ENCOUNTER — Telehealth: Payer: Self-pay

## 2021-09-29 NOTE — Telephone Encounter (Signed)
Initiated Prior authorization YBN:[id number] HFA 108 (90 Base)MCG/ACT aerosol Via: Covermymeds Case/Key:[id number]  Status: approved as of 09/29/21 Reason: approved through 08/06/2022 Notified Pt via: Mychart

## 2021-10-02 NOTE — Assessment & Plan Note (Signed)
Tolerating atorvastatin well at current strength.  Recommend continuation. 

## 2021-10-02 NOTE — Progress Notes (Signed)
Antonio Rivera - 66 y.o. male MRN [id number]  Date of birth: Oct 24, 1955  Subjective Chief Complaint  Patient presents with   Diabetes   Transitions Of Care    HPI Antonio Rivera is a 66 year old male here today for follow-up visit.  He is a former patient of Dr. Sheppard Coil.  He has a history of type 2 diabetes, hypertension and hyperlipidemia.  Reports he is doing well with current medications for management of diabetes.  Tolerating Synjardy and Januvia well.  He is using a Dexcom CGM currently.  This is kind of expensive and he would like to discuss other options.  Blood pressures remain well controlled with lisinopril.  Tolerating well without side effects.  Denies chest pain, shortness of breath, palpitations, headaches or vision changes.  He is taking atorvastatin daily without problems.  ROS:  A comprehensive ROS was completed and negative except as noted per HPI  Allergies  Allergen Reactions   Oysters [Shellfish Allergy] Nausea And Vomiting   Prednisone Anxiety    Past Medical History:  Diagnosis Date   Diabetes mellitus    type 2   HNP (herniated nucleus pulposus)    C6--7   Hyperlipidemia    Hypertension    OSA on CPAP 1999    Past Surgical History:  Procedure Laterality Date   ruptured disk surgery  1999   C6--7   TONSILLECTOMY  age 46    Social History   Socioeconomic History   Marital status: Significant Other    Spouse name: Laurian Brim   Number of children: 3   Years of education: 13   Highest education level: High school graduate  Occupational History   Occupation: Full-time Engineer, manufacturing systems  Tobacco Use   Smoking status: Former    Types: Pipe    Quit date: 08/07/1989    Years since quitting: 32.1   Smokeless tobacco: Never  Substance and Sexual Activity   Alcohol use: Yes    Alcohol/week: 7.0 standard drinks    Types: 7 Shots of liquor per week    Comment: per week   Drug use: No   Sexual activity: Not on file  Other Topics Concern   Not on  file  Social History Narrative   Lives with his significant other. He has three children. He is still working full time. He enjoys wood working.    Social Determinants of Health   Financial Resource Strain: Low Risk    Difficulty of Paying Living Expenses: Not hard at all  Food Insecurity: No Food Insecurity   Worried About Charity fundraiser in the Last Year: Never true   [place] in the Last Year: Never true  Transportation Needs: No Transportation Needs   Lack of Transportation (Medical): No   Lack of Transportation (Non-Medical): No  Physical Activity: Inactive   Days of Exercise per Week: 0 days   Minutes of Exercise per Session: 0 min  Stress: No Stress Concern Present   Feeling of Stress : Not at all  Social Connections: Moderately Integrated   Frequency of Communication with Friends and Family: More than three times a week   Frequency of Social Gatherings with Friends and Family: Twice a week   Attends Religious Services: More than 4 times per year   Active Member of [employer] or Organizations: No   Attends Archivist Meetings: Never   Marital Status: Living with partner    Family History  Problem Relation Age of Onset   Cancer Mother  skin   Cancer Father 69       prostate/died age 49    Cancer Sister        ovarian cancer   Cancer Brother        skin cancer    Health Maintenance  Topic Date Due   COVID-19 Vaccine (1) Never done   FOOT EXAM  04/15/2022 (Originally 09/18/2019)   HEMOGLOBIN A1C  03/27/2022   OPHTHALMOLOGY EXAM  03/29/2022   COLONOSCOPY (Pts 45-62yrs Insurance coverage will need to be confirmed)  04/27/2023   TETANUS/TDAP  07/05/2024   Pneumonia Vaccine 37+ Years old  Completed   INFLUENZA VACCINE  Completed   Hepatitis C Screening  Completed   Zoster Vaccines- Shingrix  Completed   HPV VACCINES  Aged Out      ----------------------------------------------------------------------------------------------------------------------------------------------------------------------------------------------------------------- Physical Exam BP 132/70 (BP Location: Left Arm, Patient Position: Sitting, Cuff Size: Large)    Pulse 76    Temp 98.5 F (36.9 C)    Ht 6' (1.829 m)    Wt 257 lb (116.6 kg)    SpO2 97%    BMI 34.86 kg/m   Physical Exam Constitutional:      Appearance: Normal appearance.  Eyes:     General: No scleral icterus. Cardiovascular:     Rate and Rhythm: Normal rate and regular rhythm.  Pulmonary:     Effort: Pulmonary effort is normal.     Breath sounds: Normal breath sounds.  Musculoskeletal:     Cervical back: Neck supple.  Neurological:     General: No focal deficit present.     Mental Status: He is alert.  Psychiatric:        Mood and Affect: Mood normal.        Behavior: Behavior normal.    ------------------------------------------------------------------------------------------------------------------------------------------------------------------------------------------------------------------- Assessment and Plan  Essential hypertension, benign Blood pressure is well controlled with lisinopril.  Continue at current strength.  Type 2 diabetes mellitus with diabetic neuropathy, unspecified ([place]) Lab Results  Component Value Date   HGBA1C 7.9 (A) 09/27/2021  Blood sugars improved some from last check.  Continue to work on dietary change.  We will see if freestyle Elenor Legato is cheaper for him compared to Dexcom.  Given sample of freestyle libre 3 today.  Hyperlipidemia LDL goal <100 Tolerating atorvastatin well at current strength.  Recommend continuation.   Meds ordered this encounter  Medications   DISCONTD: Continuous Blood Gluc Sensor (FREESTYLE LIBRE 3 SENSOR) MISC    Sig: 1 Device by Does not apply route every 14 (fourteen) days.    Dispense:  6 each     Refill:  3   Continuous Blood Gluc Sensor (FREESTYLE LIBRE 3 SENSOR) MISC    Sig: 1 Device by Does not apply route every 14 (fourteen) days.    Dispense:  6 each    Refill:  3   albuterol (VENTOLIN HFA) 108 (90 Base) MCG/ACT inhaler    Sig: Inhale 1-2 puffs into the lungs every 6 (six) hours as needed for wheezing or shortness of breath.    Dispense:  1 each    Refill:  3   Empagliflozin-metFORMIN HCl (SYNJARDY) 12.12-998 MG TABS    Sig: Take 1 tablet by mouth 2 (two) times daily.    Dispense:  14 tablet    Refill:  0    Return in about 6 months (around 03/27/2022) for HTN/DM.    This visit occurred during the SARS-CoV-2 public health emergency.  Safety protocols were in place, including screening questions prior to  the visit, additional usage of staff PPE, and extensive cleaning of exam room while observing appropriate contact time as indicated for disinfecting solutions.

## 2021-10-02 NOTE — Assessment & Plan Note (Signed)
Blood pressure is well controlled with lisinopril.  Continue at current strength.

## 2021-10-02 NOTE — Assessment & Plan Note (Signed)
Lab Results  Component Value Date   HGBA1C 7.9 (A) 09/27/2021  Blood sugars improved some from last check.  Continue to work on dietary change.  We will see if freestyle Elenor Legato is cheaper for him compared to Dexcom.  Given sample of freestyle libre 3 today.

## 2021-10-13 ENCOUNTER — Other Ambulatory Visit: Payer: Self-pay

## 2021-10-13 DIAGNOSIS — M5136 Other intervertebral disc degeneration, lumbar region: Secondary | ICD-10-CM

## 2021-10-13 MED ORDER — SILDENAFIL CITRATE 100 MG PO TABS: 50.0000 mg | ORAL_TABLET | Freq: Every day | ORAL | 11 refills | Status: DC | PRN

## 2021-10-13 MED ORDER — GABAPENTIN 800 MG PO TABS: ORAL_TABLET | ORAL | 3 refills | Status: DC

## 2021-11-19 ENCOUNTER — Other Ambulatory Visit: Payer: Self-pay | Admitting: Family Medicine

## 2021-11-19 DIAGNOSIS — E119 Type 2 diabetes mellitus without complications: Secondary | ICD-10-CM

## 2021-11-22 ENCOUNTER — Other Ambulatory Visit: Payer: Self-pay | Admitting: Family Medicine

## 2021-11-22 DIAGNOSIS — M5136 Other intervertebral disc degeneration, lumbar region: Secondary | ICD-10-CM

## 2022-01-24 ENCOUNTER — Other Ambulatory Visit: Payer: Self-pay | Admitting: Family Medicine

## 2022-03-17 ENCOUNTER — Other Ambulatory Visit: Payer: Self-pay | Admitting: Family Medicine

## 2022-03-17 DIAGNOSIS — E785 Hyperlipidemia, unspecified: Secondary | ICD-10-CM

## 2022-03-17 DIAGNOSIS — E1159 Type 2 diabetes mellitus with other circulatory complications: Secondary | ICD-10-CM

## 2022-03-17 DIAGNOSIS — R972 Elevated prostate specific antigen [PSA]: Secondary | ICD-10-CM

## 2022-03-17 DIAGNOSIS — Z0000 Encounter for general adult medical examination without abnormal findings: Secondary | ICD-10-CM

## 2022-03-17 DIAGNOSIS — I10 Essential (primary) hypertension: Secondary | ICD-10-CM

## 2022-03-17 DIAGNOSIS — Z8042 Family history of malignant neoplasm of prostate: Secondary | ICD-10-CM

## 2022-03-17 DIAGNOSIS — E119 Type 2 diabetes mellitus without complications: Secondary | ICD-10-CM

## 2022-03-20 NOTE — Telephone Encounter (Signed)
Hold refills until 03/27/22 appt.

## 2022-03-27 ENCOUNTER — Encounter: Payer: Self-pay | Admitting: Family Medicine

## 2022-03-27 ENCOUNTER — Ambulatory Visit (INDEPENDENT_AMBULATORY_CARE_PROVIDER_SITE_OTHER): Payer: Medicare Other | Admitting: Family Medicine

## 2022-03-27 DIAGNOSIS — E785 Hyperlipidemia, unspecified: Secondary | ICD-10-CM

## 2022-03-27 DIAGNOSIS — E1140 Type 2 diabetes mellitus with diabetic neuropathy, unspecified: Secondary | ICD-10-CM

## 2022-03-27 DIAGNOSIS — I10 Essential (primary) hypertension: Secondary | ICD-10-CM

## 2022-03-27 LAB — COMPLETE METABOLIC PANEL WITH GFR
AG Ratio: 1.9 (calc) (ref 1.0–2.5)
ALT: 23 U/L (ref 9–46)
AST: 18 U/L (ref 10–35)
Albumin: 4.1 g/dL (ref 3.6–5.1)
Alkaline phosphatase (APISO): 54 U/L (ref 35–144)
BUN: 20 mg/dL (ref 7–25)
CO2: 26 mmol/L (ref 20–32)
Calcium: 9.3 mg/dL (ref 8.6–10.3)
Chloride: 103 mmol/L (ref 98–110)
Creat: 0.88 mg/dL (ref 0.70–1.35)
Globulin: 2.2 g/dL (calc) (ref 1.9–3.7)
Glucose, Bld: 208 mg/dL — ABNORMAL HIGH (ref 65–99)
Potassium: 4.7 mmol/L (ref 3.5–5.3)
Sodium: 137 mmol/L (ref 135–146)
Total Bilirubin: 0.4 mg/dL (ref 0.2–1.2)
Total Protein: 6.3 g/dL (ref 6.1–8.1)
eGFR: 95 mL/min/{1.73_m2} (ref >=60)

## 2022-03-27 LAB — CBC
HCT: 47.8 % (ref 38.5–50.0)
Hemoglobin: 15.5 g/dL (ref 13.2–17.1)
MCH: 29.5 pg (ref 27.0–33.0)
MCHC: 32.4 g/dL (ref 32.0–36.0)
MCV: 90.9 fL (ref 80.0–100.0)
MPV: 10.7 fL (ref 7.5–12.5)
Platelets: 159 10*3/uL (ref 140–400)
RBC: 5.26 10*6/uL (ref 4.20–5.80)
RDW: 13.9 % (ref 11.0–15.0)
WBC: 5.1 10*3/uL (ref 3.8–10.8)

## 2022-03-27 LAB — HEMOGLOBIN A1C
Hgb A1c MFr Bld: 7.9 % of total Hgb — ABNORMAL HIGH (ref <5.7)
Mean Plasma Glucose: 180 mg/dL
eAG (mmol/L): 10 mmol/L

## 2022-03-27 LAB — LIPID PANEL
Cholesterol: 137 mg/dL (ref <200)
HDL: 49 mg/dL (ref >=40)
LDL Cholesterol (Calc): 64 mg/dL (calc)
Non-HDL Cholesterol (Calc): 88 mg/dL (calc) (ref <130)
Total CHOL/HDL Ratio: 2.8 (calc) (ref <5.0)
Triglycerides: 165 mg/dL — ABNORMAL HIGH (ref <150)

## 2022-03-27 LAB — PSA, TOTAL WITH REFLEX TO PSA, FREE: PSA, Total: 0.8 ng/mL (ref <=4.0)

## 2022-03-27 MED ORDER — RYBELSUS 7 MG PO TABS: 7.0000 mg | ORAL_TABLET | Freq: Every day | ORAL | 2 refills | Status: DC

## 2022-03-27 MED ORDER — RYBELSUS 3 MG PO TABS: 3.0000 mg | ORAL_TABLET | Freq: Every day | ORAL | 0 refills | Status: DC

## 2022-03-27 NOTE — Patient Instructions (Signed)
Take '3mg'$  of Rybelsus daily x30 days then increase to '7mg'$  daily. (Sent to pharmacy).  Stop Januvia.  See me again in 4 months.

## 2022-04-02 ENCOUNTER — Encounter: Payer: Self-pay | Admitting: Family Medicine

## 2022-04-02 NOTE — Assessment & Plan Note (Signed)
Tolerating atorvastatin well at current strength.  Recommend continuation. 

## 2022-04-02 NOTE — Assessment & Plan Note (Signed)
Lab Results  Component Value Date   HGBA1C 7.9 (H) 03/23/2022  Diabetes could be better controlled.  Continue to work on dietary changes.  Discontinue Januvia and start Rybelsus.

## 2022-04-02 NOTE — Assessment & Plan Note (Signed)
Blood pressure well controlled at this time.  Recommend continuation of current strength of lisinopril.

## 2022-04-02 NOTE — Progress Notes (Signed)
BELDON NOWLING - 66 y.o. male MRN [id number]  Date of birth: 1955-12-08  Subjective Chief Complaint  Patient presents with   Hypertension   Diabetes    HPI Antonio Rivera is a 66 year old male here today for follow-up visit.  Reports he is doing well at this time.  Continues on [place] for management of diabetes.  Tolerating current medications well.  He has not made any significant changes to diet or lifestyle.  Blood sugars have been elevated on CGM.  He does not want to add any injectable medications but would be open to trying Rybelsus.  Doing well with current strength of atorvastatin for management of coexisting hyperlipidemia.  Blood pressures remain stable with lisinopril.  No side effects from this.  Has not had chest pain, shortness of breath, palpitations, headaches or vision changes.  ROS:  A comprehensive ROS was completed and negative except as noted per HPI  Allergies  Allergen Reactions   Oysters [Shellfish Allergy] Nausea And Vomiting   Prednisone Anxiety    Past Medical History:  Diagnosis Date   Diabetes mellitus    type 2   HNP (herniated nucleus pulposus)    C6--7   Hyperlipidemia    Hypertension    OSA on CPAP 1999    Past Surgical History:  Procedure Laterality Date   ruptured disk surgery  1999   C6--7   TONSILLECTOMY  age 24    Social History   Socioeconomic History   Marital status: Significant Other    Spouse name: Laurian Brim   Number of children: 3   Years of education: 13   Highest education level: High school graduate  Occupational History   Occupation: Full-time Engineer, manufacturing systems  Tobacco Use   Smoking status: Former    Types: Pipe    Quit date: 08/07/1989    Years since quitting: 32.6   Smokeless tobacco: Never  Substance and Sexual Activity   Alcohol use: Yes    Alcohol/week: 7.0 standard drinks of alcohol    Types: 7 Shots of liquor per week    Comment: per week   Drug use: No   Sexual activity: Not on file   Other Topics Concern   Not on file  Social History Narrative   Lives with his significant other. He has three children. He is still working full time. He enjoys wood working.    Social Determinants of Health   Financial Resource Strain: Low Risk  (04/15/2021)   Overall Financial Resource Strain (CARDIA)    Difficulty of Paying Living Expenses: Not hard at all  Food Insecurity: No Food Insecurity (04/15/2021)   Hunger Vital Sign    Worried About Running Out of Food in the Last Year: Never true    Ran Out of Food in the Last Year: Never true  Transportation Needs: No Transportation Needs (04/15/2021)   PRAPARE - Hydrologist (Medical): No    Lack of Transportation (Non-Medical): No  Physical Activity: Inactive (04/15/2021)   Exercise Vital Sign    Days of Exercise per Week: 0 days    Minutes of Exercise per Session: 0 min  Stress: No Stress Concern Present (04/15/2021)   [place]    Feeling of Stress : Not at all  Social Connections: Moderately Integrated (04/15/2021)   Social Connection and Isolation Panel [NHANES]    Frequency of Communication with Friends and Family: More than three times a week  Frequency of Social Gatherings with Friends and Family: Twice a week    Attends Religious Services: More than 4 times per year    Active Member of Clubs or Organizations: No    Attends Music therapist: Never    Marital Status: Living with partner    Family History  Problem Relation Age of Onset   Cancer Mother        skin   Cancer Father 59       prostate/died age 67    Cancer Sister        ovarian cancer   Cancer Brother        skin cancer    Health Maintenance  Topic Date Due   Diabetic kidney evaluation - Urine ACR  10/01/2015   INFLUENZA VACCINE  03/07/2022   OPHTHALMOLOGY EXAM  03/29/2022   COVID-19 Vaccine (1) 04/12/2022 (Originally 01/28/1957)   FOOT EXAM   04/15/2022 (Originally 09/18/2019)   HEMOGLOBIN A1C  09/23/2022   Diabetic kidney evaluation - GFR measurement  03/24/2023   COLONOSCOPY (Pts 45-18yr Insurance coverage will need to be confirmed)  04/27/2023   TETANUS/TDAP  07/05/2024   Pneumonia Vaccine 66 Years old  Completed   Hepatitis C Screening  Completed   Zoster Vaccines- Shingrix  Completed   HPV VACCINES  Aged Out     ----------------------------------------------------------------------------------------------------------------------------------------------------------------------------------------------------------------- Physical Exam BP 123/70 (BP Location: Right Arm, Patient Position: Sitting, Cuff Size: Large)   Pulse 73   Temp 98.8 F (37.1 C) (Oral)   Ht '5\' 11"'$  (1.803 m)   Wt 256 lb 1.9 oz (116.2 kg)   SpO2 97%   BMI 35.72 kg/m   Physical Exam Constitutional:      Appearance: Normal appearance.  Cardiovascular:     Rate and Rhythm: Normal rate and regular rhythm.  Musculoskeletal:     Cervical back: Neck supple.  Neurological:     Mental Status: He is alert.  Psychiatric:        Mood and Affect: Mood normal.        Behavior: Behavior normal.     ------------------------------------------------------------------------------------------------------------------------------------------------------------------------------------------------------------------- Assessment and Plan  Essential hypertension, benign Blood pressure well controlled at this time.  Recommend continuation of current strength of lisinopril.  Type 2 diabetes mellitus with diabetic neuropathy, unspecified (HCC) Lab Results  Component Value Date   HGBA1C 7.9 (H) 03/23/2022  Diabetes could be better controlled.  Continue to work on dietary changes.  Discontinue Januvia and start Rybelsus.  Hyperlipidemia LDL goal <100 Tolerating atorvastatin well at current strength.  Recommend continuation.   Meds ordered this encounter   Medications   DISCONTD: Semaglutide (RYBELSUS) 7 MG TABS    Sig: Take 7 mg by mouth daily.    Dispense:  30 tablet    Refill:  2   Semaglutide (RYBELSUS) 3 MG TABS    Sig: Take 3 mg by mouth daily. EFGH:[id number]: 12/2022    Dispense:  30 tablet    Refill:  0    <SAMPLE>   Semaglutide (RYBELSUS) 7 MG TABS    Sig: Take 7 mg by mouth daily.    Dispense:  30 tablet    Refill:  2    Return in about 4 months (around 07/27/2022) for T2DM.    This visit occurred during the SARS-CoV-2 public health emergency.  Safety protocols were in place, including screening questions prior to the visit, additional usage of staff PPE, and extensive cleaning of exam room while observing appropriate contact time as indicated for  disinfecting solutions.

## 2022-04-17 LAB — HM DIABETES EYE EXAM

## 2022-04-26 ENCOUNTER — Other Ambulatory Visit: Payer: Self-pay

## 2022-04-26 DIAGNOSIS — E1140 Type 2 diabetes mellitus with diabetic neuropathy, unspecified: Secondary | ICD-10-CM

## 2022-04-26 MED ORDER — RYBELSUS 7 MG PO TABS: 7.0000 mg | ORAL_TABLET | Freq: Every day | ORAL | 5 refills | Status: DC

## 2022-05-01 ENCOUNTER — Other Ambulatory Visit: Payer: Self-pay | Admitting: Family Medicine

## 2022-05-03 ENCOUNTER — Encounter: Payer: Self-pay | Admitting: Family Medicine

## 2022-05-19 ENCOUNTER — Ambulatory Visit (INDEPENDENT_AMBULATORY_CARE_PROVIDER_SITE_OTHER): Payer: Medicare Other | Admitting: Family Medicine

## 2022-05-19 DIAGNOSIS — Z0000 Encounter for general adult medical examination without abnormal findings: Secondary | ICD-10-CM | POA: Diagnosis not present

## 2022-05-19 NOTE — Patient Instructions (Signed)
Antonio Rivera Maintenance Summary and Written Plan of Care  Mr. Blanck ,  Thank you for allowing me to perform your Medicare Annual Wellness Visit and for your ongoing commitment to your health.   Health Maintenance & Immunization History Health Maintenance  Topic Date Due   Diabetic kidney evaluation - Urine ACR  05/20/2022 (Originally 10/01/2015)   FOOT EXAM  05/20/2022 (Originally 09/18/2019)   COVID-19 Vaccine (1) 06/04/2022 (Originally 01/28/1957)   INFLUENZA VACCINE  11/05/2022 (Originally 03/07/2022)   HEMOGLOBIN A1C  09/23/2022   Diabetic kidney evaluation - GFR measurement  03/24/2023   OPHTHALMOLOGY EXAM  04/18/2023   COLONOSCOPY (Pts 45-10yr Insurance coverage will need to be confirmed)  04/27/2023   TETANUS/TDAP  07/05/2024   Pneumonia Vaccine 66 Years old  Completed   Hepatitis C Screening  Completed   Zoster Vaccines- Shingrix  Completed   HPV VACCINES  Aged Out   Immunization History  Administered Date(s) Administered   Fluad Quad(high Dose 67+) 04/15/2021   Influenza Whole 05/24/2009, 06/14/2012, 05/21/2013   Influenza,inj,Quad PF,6+ Mos 03/30/2014, 03/31/2015, 04/07/2016, 04/24/2017, 05/16/2018, 05/19/2019, 05/26/2020   PNEUMOCOCCAL CONJUGATE-20 04/15/2021   Pneumococcal Polysaccharide-23 04/02/2013   Td 08/08/2007   Tdap 07/05/2014   Zoster Recombinat (Shingrix) 03/18/2019, 05/19/2019    These are the patient goals that we discussed:  Goals Addressed               This Visit's Progress     Patient Stated (pt-stated)        05/19/2022 AWV Goal: Diabetes Management  Patient will maintain an A1C level below 7.0 Patient will not develop any diabetic foot complications Patient will not experience any hypoglycemic episodes over the next 3 months Patient will notify our office of any CBG readings outside of the provider recommended range by calling [phone] will adhere to provider recommendations for diabetes  management  Patient Self Management Activities take all medications as prescribed and report any negative side effects monitor and record blood sugar readings as directed adhere to a low carbohydrate diet that incorporates lean proteins, vegetables, whole grains, low glycemic fruits check feet daily noting any sores, cracks, injuries, or callous formations see PCP or podiatrist if he notices any changes in his legs, feet, or toenails Patient will visit PCP and have an A1C level checked every 3 to 6 months as directed  have a yearly eye exam to monitor for vascular changes associated with diabetes and will request that the report be sent to his pcp.  consult with his PCP regarding any changes in his health or new or worsening symptoms          This is a list of Health Maintenance Items that are overdue or due now: Influenza vaccine Foot exam Urine ACR  Orders/Referrals Placed Today: No orders of the defined types were placed in this encounter.  (Contact our referral department at [phone] you have not spoken with someone about your referral appointment within the next 5 days)    Follow-up Plan Follow-up with MLuetta Nutting DO as planned Schedule your influenza vaccine.  Medicare wellness visit in one year.  Patient will access AVS on my chart      Health Maintenance, Male Adopting a healthy lifestyle and getting preventive care are important in promoting health and wellness. Ask your health care provider about: The right schedule for you to have regular tests and exams. Things you can do on your own to prevent diseases and keep yourself healthy. What  should I know about diet, weight, and exercise? Eat a healthy diet  Eat a diet that includes plenty of vegetables, fruits, low-fat dairy products, and lean protein. Do not eat a lot of foods that are high in solid fats, added sugars, or sodium. Maintain a healthy weight Body mass index (BMI) is a measurement that can  be used to identify possible weight problems. It estimates body fat based on height and weight. Your health care provider can help determine your BMI and help you achieve or maintain a healthy weight. Get regular exercise Get regular exercise. This is one of the most important things you can do for your health. Most adults should: Exercise for at least 150 minutes each week. The exercise should increase your heart rate and make you sweat (moderate-intensity exercise). Do strengthening exercises at least twice a week. This is in addition to the moderate-intensity exercise. Spend less time sitting. Even light physical activity can be beneficial. Watch cholesterol and blood lipids Have your blood tested for lipids and cholesterol at 67 years of age, then have this test every 5 years. You may need to have your cholesterol levels checked more often if: Your lipid or cholesterol levels are high. You are older than 67 years of age. You are at high risk for heart disease. What should I know about cancer screening? Many types of cancers can be detected early and may often be prevented. Depending on your health history and family history, you may need to have cancer screening at various ages. This may include screening for: Colorectal cancer. Prostate cancer. Skin cancer. Lung cancer. What should I know about heart disease, diabetes, and high blood pressure? Blood pressure and heart disease High blood pressure causes heart disease and increases the risk of stroke. This is more likely to develop in people who have high blood pressure readings or are overweight. Talk with your health care provider about your target blood pressure readings. Have your blood pressure checked: Every 3-5 years if you are 67-67 years of age. Every year if you are 67 years old or older. If you are between the ages of 67 and 67 and are a current or former smoker, ask your health care provider if you should have a one-time  screening for abdominal aortic aneurysm (AAA). Diabetes Have regular diabetes screenings. This checks your fasting blood sugar level. Have the screening done: Once every three years after age 72 if you are at a normal weight and have a low risk for diabetes. More often and at a younger age if you are overweight or have a high risk for diabetes. What should I know about preventing infection? Hepatitis B If you have a higher risk for hepatitis B, you should be screened for this virus. Talk with your health care provider to find out if you are at risk for hepatitis B infection. Hepatitis C Blood testing is recommended for: Everyone born from [birth year] through 1966. Anyone with known risk factors for hepatitis C. Sexually transmitted infections (STIs) You should be screened each year for STIs, including gonorrhea and chlamydia, if: You are sexually active and are younger than 67 years of age. You are older than 67 years of age and your health care provider tells you that you are at risk for this type of infection. Your sexual activity has changed since you were last screened, and you are at increased risk for chlamydia or gonorrhea. Ask your health care provider if you are at risk. Ask your health care  provider about whether you are at high risk for HIV. Your health care provider may recommend a prescription medicine to help prevent HIV infection. If you choose to take medicine to prevent HIV, you should first get tested for HIV. You should then be tested every 3 months for as long as you are taking the medicine. Follow these instructions at home: Alcohol use Do not drink alcohol if your health care provider tells you not to drink. If you drink alcohol: Limit how much you have to 0-2 drinks a day. Know how much alcohol is in your drink. In the [place] one drink equals one 12 oz bottle of beer (355 mL), one 5 oz glass of wine (148 mL), or one 1 oz glass of hard liquor (44 mL). Lifestyle Do not use any  products that contain nicotine or tobacco. These products include cigarettes, chewing tobacco, and vaping devices, such as e-cigarettes. If you need help quitting, ask your health care provider. Do not use street drugs. Do not share needles. Ask your health care provider for help if you need support or information about quitting drugs. General instructions Schedule regular health, dental, and eye exams. Stay current with your vaccines. Tell your health care provider if: You often feel depressed. You have ever been abused or do not feel safe at home. Summary Adopting a healthy lifestyle and getting preventive care are important in promoting health and wellness. Follow your health care provider's instructions about healthy diet, exercising, and getting tested or screened for diseases. Follow your health care provider's instructions on monitoring your cholesterol and blood pressure. This information is not intended to replace advice given to you by your health care provider. Make sure you discuss any questions you have with your health care provider. Document Revised: 12/13/2020 Document Reviewed: 12/13/2020 Elsevier Patient Education  [place].

## 2022-05-19 NOTE — Progress Notes (Signed)
MEDICARE ANNUAL WELLNESS VISIT  05/19/2022  Telephone Visit Disclaimer This Medicare AWV was conducted by telephone due to national recommendations for restrictions regarding the COVID-19 Pandemic (e.g. social distancing).  I verified, using two identifiers, that I am speaking with Antonio Rivera or their authorized healthcare agent. I discussed the limitations, risks, security, and privacy concerns of performing an evaluation and management service by telephone and the potential availability of an in-person appointment in the future. The patient expressed understanding and agreed to proceed.  Location of Patient: Home Location of Provider (nurse):  In the office.  Subjective:    Antonio Rivera is a 66 y.o. male patient of Luetta Nutting, DO who had a Medicare Annual Wellness Visit today via telephone. Antonio Rivera is Working full time and lives with their spouse. he has 3 children. he reports that he is socially active and does interact with friends/family regularly. he is minimally physically active and enjoys woodworking.  Patient Care Team: Luetta Nutting, DO as PCP - General (Family Medicine)     05/19/2022    9:18 AM 04/15/2021    3:23 PM  Advanced Directives  Does Patient Have a Medical Advance Directive? No No  Would patient like information on creating a medical advance directive? No - Patient declined No - Patient declined    Hospital Utilization Over the Past 12 Months: # of hospitalizations or ER visits: 0 # of surgeries: 0  Review of Systems    Patient reports that his overall health is better compared to last year.  History obtained from chart review and the patient  Patient Reported Readings (BP, Pulse, CBG, Weight, etc) none  Pain Assessment Pain : No/denies pain     Current Medications & Allergies (verified) Allergies as of 05/19/2022       Reactions   Oysters [shellfish Allergy] Nausea And Vomiting   Prednisone Anxiety        Medication List         Accurate as of May 19, 2022  9:31 AM. If you have any questions, ask your nurse or doctor.          acetaminophen 500 MG tablet Commonly known as: TYLENOL 1000 g p.o. twice daily to 3 times daily   albuterol 108 (90 Base) MCG/ACT inhaler Commonly known as: VENTOLIN HFA USE 1 TO 2 INHALATIONS BY MOUTH  EVERY 6 HOURS AS NEEDED FOR  WHEEZING OR SHORTNESS OF BREATH   AMBULATORY NON FORMULARY MEDICATION CPAP mask and supplies as needed.  Also provide 30 day download.  Send to [phone] (fax) Dx: OSA   aspirin EC 81 MG tablet Take 1 tablet (81 mg total) by mouth daily.   atorvastatin 20 MG tablet Commonly known as: LIPITOR TAKE 1 TABLET BY MOUTH DAILY   cetirizine 10 MG tablet Commonly known as: ZYRTEC Take 1 tablet (10 mg total) by mouth daily.   Dexcom G6 Receiver Devi Use to receive glucose readings.   Dexcom G6 Sensor Misc Use a new sensor every 10 days.   FreeStyle Libre 3 Sensor Misc 1 Device by Does not apply route every 14 (fourteen) days.   Dexcom G6 Transmitter Misc 1 each by Does not apply route daily. Use to check glucose.   famotidine 20 MG tablet Commonly known as: PEPCID TAKE 2 TABLETS BY MOUTH DAILY   Fish Oil Oil by Does not apply route.   gabapentin 800 MG tablet Commonly known as: NEURONTIN TAKE 1 AND 1/2 TO 2 TABLETS BY  MOUTH  NIGHTLY   lisinopril 10 MG tablet Commonly known as: ZESTRIL TAKE 1 TABLET BY MOUTH DAILY   MULTIVITAMIN PO Take 1 tablet by mouth every morning.   Nystatin Powd Apply liberally to affected area 2 times per day   Red Yeast Rice 600 MG Caps 2 caps every evening   Rybelsus 7 MG Tabs Generic drug: Semaglutide Take 7 mg by mouth daily.   sildenafil 100 MG tablet Commonly known as: Viagra Take 0.5-1 tablets (50-100 mg total) by mouth daily as needed for erectile dysfunction.   Synjardy 12.12-998 MG Tabs Generic drug: Empagliflozin-metFORMIN HCl TAKE 1 TABLET BY MOUTH TWICE  DAILY         History (reviewed): Past Medical History:  Diagnosis Date   Diabetes mellitus    type 2   HNP (herniated nucleus pulposus)    C6--7   Hyperlipidemia    Hypertension    OSA on CPAP 1999   Past Surgical History:  Procedure Laterality Date   ruptured disk surgery  1999   C6--7   TONSILLECTOMY  age 46   Family History  Problem Relation Age of Onset   Cancer Mother        skin   Cancer Father 83       prostate/died age 55    Cancer Sister        ovarian cancer   Cancer Brother        skin cancer   Social History   Socioeconomic History   Marital status: Significant Other    Spouse name: Laurian Brim   Number of children: 3   Years of education: 13   Highest education level: High school graduate  Occupational History   Occupation: Full-time Engineer, manufacturing systems  Tobacco Use   Smoking status: Former    Types: Pipe    Quit date: 08/07/1989    Years since quitting: 32.8   Smokeless tobacco: Never  Vaping Use   Vaping Use: Never used  Substance and Sexual Activity   Alcohol use: Yes    Alcohol/week: 7.0 standard drinks of alcohol    Types: 7 Shots of liquor per week    Comment: per week   Drug use: No   Sexual activity: Not on file  Other Topics Concern   Not on file  Social History Narrative   Lives with his significant other. He has three children. He is still working full time. He enjoys wood working.    Social Determinants of Health   Financial Resource Strain: Low Risk  (05/19/2022)   Overall Financial Resource Strain (CARDIA)    Difficulty of Paying Living Expenses: Not hard at all  Food Insecurity: No Food Insecurity (05/19/2022)   Hunger Vital Sign    Worried About Running Out of Food in the Last Year: Never true    Ran Out of Food in the Last Year: Never true  Transportation Needs: No Transportation Needs (05/19/2022)   PRAPARE - Hydrologist (Medical): No    Lack of Transportation (Non-Medical): No  Physical Activity:  Inactive (05/19/2022)   Exercise Vital Sign    Days of Exercise per Week: 0 days    Minutes of Exercise per Session: 0 min  Stress: No Stress Concern Present (05/19/2022)   [place]    Feeling of Stress : Not at all  Social Connections: Moderately Integrated (05/19/2022)   Social Connection and Isolation Panel [NHANES]    Frequency of Communication  with Friends and Family: More than three times a week    Frequency of Social Gatherings with Friends and Family: Three times a week    Attends Religious Services: More than 4 times per year    Active Member of Clubs or Organizations: No    Attends Archivist Meetings: Never    Marital Status: Living with partner    Activities of Daily Living    05/19/2022    9:21 AM  In your present state of health, do you have any difficulty performing the following activities:  Hearing? 0  Vision? 0  Difficulty concentrating or making decisions? 0  Walking or climbing stairs? 0  Dressing or bathing? 0  Doing errands, shopping? 0  Preparing Food and eating ? N  Using the Toilet? N  In the past six months, have you accidently leaked urine? N  Do you have problems with loss of bowel control? N  Managing your Medications? N  Managing your Finances? N  Housekeeping or managing your Housekeeping? N    Patient Education/ Literacy How often do you need to have someone help you when you read instructions, pamphlets, or other written materials from your doctor or pharmacy?: 1 - Never What is the last grade level you completed in school?: 12th grade  Exercise Current Exercise Habits: The patient has a physically strenuous job, but has no regular exercise apart from work., Exercise limited by: None identified  Diet Patient reports consuming  1-2  meals a day and 1-2 snack(s) a day Patient reports that his primary diet is: Regular Patient reports that she does have regular  access to food.   Depression Screen    05/19/2022    9:19 AM 03/27/2022    3:54 PM 09/27/2021    3:50 PM 04/15/2021    3:24 PM 05/26/2020    8:50 AM 01/22/2020    7:38 AM 03/18/2019    4:24 PM  PHQ 2/9 Scores  PHQ - 2 Score 0 0 0 0 1 0 0  PHQ- 9 Score      0      Fall Risk    05/19/2022    9:18 AM 03/27/2022    3:54 PM 09/27/2021    3:50 PM 04/15/2021    3:24 PM 05/26/2020    8:49 AM  [place] in the past year? 0 0 '1 1 1  '$ Number falls in past yr: 0 0 0 0 0  Injury with Fall? 0 0 0 0 0  Risk for fall due to : No Fall Risks No Fall Risks No Fall Risks No Fall Risks   Follow up Falls evaluation completed Falls evaluation completed Falls evaluation completed Falls evaluation completed;Education provided;Falls prevention discussed      Objective:  HILMAN KISSLING seemed alert and oriented and he participated appropriately during our telephone visit.  Blood Pressure Weight BMI  BP Readings from Last 3 Encounters:  03/27/22 123/70  09/27/21 132/70  04/26/21 118/61   Wt Readings from Last 3 Encounters:  03/27/22 256 lb 1.9 oz (116.2 kg)  09/27/21 257 lb (116.6 kg)  04/26/21 249 lb (112.9 kg)   BMI Readings from Last 1 Encounters:  03/27/22 35.72 kg/m    *Unable to obtain current vital signs, weight, and BMI due to telephone visit type  Hearing/Vision  Dave did not seem to have difficulty with hearing/understanding during the telephone conversation Reports that he has had a formal eye exam by an eye care professional  within the past year Reports that he has not had a formal hearing evaluation within the past year *Unable to fully assess hearing and vision during telephone visit type  Cognitive Function:    05/19/2022    9:23 AM 04/15/2021    3:35 PM  6CIT Screen  What Year? 0 points 0 points  What month? 0 points 0 points  What time? 0 points 0 points  Count back from 20 0 points 0 points  Months in reverse 0 points 0 points  Repeat phrase 2 points 2 points   Total Score 2 points 2 points   (Normal:0-7, Significant for Dysfunction: >8)  Normal Cognitive Function Screening: Yes   Immunization & Health Maintenance Record Immunization History  Administered Date(s) Administered   Fluad Quad(high Dose 65+) 04/15/2021   Influenza Whole 05/24/2009, 06/14/2012, 05/21/2013   Influenza,inj,Quad PF,6+ Mos 03/30/2014, 03/31/2015, 04/07/2016, 04/24/2017, 05/16/2018, 05/19/2019, 05/26/2020   PNEUMOCOCCAL CONJUGATE-20 04/15/2021   Pneumococcal Polysaccharide-23 04/02/2013   Td 08/08/2007   Tdap 07/05/2014   Zoster Recombinat (Shingrix) 03/18/2019, 05/19/2019    Health Maintenance  Topic Date Due   Diabetic kidney evaluation - Urine ACR  05/20/2022 (Originally 10/01/2015)   FOOT EXAM  05/20/2022 (Originally 09/18/2019)   COVID-19 Vaccine (1) 06/04/2022 (Originally 01/28/1957)   INFLUENZA VACCINE  11/05/2022 (Originally 03/07/2022)   HEMOGLOBIN A1C  09/23/2022   Diabetic kidney evaluation - GFR measurement  03/24/2023   OPHTHALMOLOGY EXAM  04/18/2023   COLONOSCOPY (Pts 45-69yr Insurance coverage will need to be confirmed)  04/27/2023   TETANUS/TDAP  07/05/2024   Pneumonia Vaccine 66 Years old  Completed   Hepatitis C Screening  Completed   Zoster Vaccines- Shingrix  Completed   HPV VACCINES  Aged Out       Assessment  This is a routine wellness examination for [employer]  Health Maintenance: Due or Overdue There are no preventive care reminders to display for this patient.   BEbony Cargodoes not need a referral for Community Assistance: Care Management:   no Social Work:    no Prescription Assistance:  no Nutrition/Diabetes Education:  no   Plan:  Personalized Goals  Goals Addressed               This Visit's Progress     Patient Stated (pt-stated)        05/19/2022 AWV Goal: Diabetes Management  Patient will maintain an A1C level below 7.0 Patient will not develop any diabetic foot complications Patient will not  experience any hypoglycemic episodes over the next 3 months Patient will notify our office of any CBG readings outside of the provider recommended range by calling [phone] will adhere to provider recommendations for diabetes management  Patient Self Management Activities take all medications as prescribed and report any negative side effects monitor and record blood sugar readings as directed adhere to a low carbohydrate diet that incorporates lean proteins, vegetables, whole grains, low glycemic fruits check feet daily noting any sores, cracks, injuries, or callous formations see PCP or podiatrist if he notices any changes in his legs, feet, or toenails Patient will visit PCP and have an A1C level checked every 3 to 6 months as directed  have a yearly eye exam to monitor for vascular changes associated with diabetes and will request that the report be sent to his pcp.  consult with his PCP regarding any changes in his health or new or worsening symptoms        Personalized Health Maintenance &  Screening Recommendations  Influenza vaccine Foot exam Urine ACR  Lung Cancer Screening Recommended: no (Low Dose CT Chest recommended if Age 45-80 years, 30 pack-year currently smoking OR have quit w/in past 15 years) Hepatitis C Screening recommended: no HIV Screening recommended: no  Advanced Directives: Written information was not prepared per patient's request.  Referrals & Orders No orders of the defined types were placed in this encounter.   Follow-up Plan Follow-up with Luetta Nutting, DO as planned Schedule your influenza vaccine.  Medicare wellness visit in one year.  Patient will access AVS on my chart   I have personally reviewed and noted the following in the patient's chart:   Medical and social history Use of alcohol, tobacco or illicit drugs  Current medications and supplements Functional ability and status Nutritional status Physical  activity Advanced directives List of other physicians Hospitalizations, surgeries, and ER visits in previous 12 months Vitals Screenings to include cognitive, depression, and falls Referrals and appointments  In addition, I have reviewed and discussed with Antonio Rivera certain preventive protocols, quality metrics, and best practice recommendations. A written personalized care plan for preventive services as well as general preventive health recommendations is available and can be mailed to the patient at his request.      Tinnie Gens, RN BSN  05/19/2022

## 2022-05-22 ENCOUNTER — Ambulatory Visit (INDEPENDENT_AMBULATORY_CARE_PROVIDER_SITE_OTHER): Payer: Medicare Other | Admitting: Family Medicine

## 2022-05-22 DIAGNOSIS — Z23 Encounter for immunization: Secondary | ICD-10-CM | POA: Diagnosis not present

## 2022-07-27 ENCOUNTER — Ambulatory Visit (INDEPENDENT_AMBULATORY_CARE_PROVIDER_SITE_OTHER): Payer: Medicare Other | Admitting: Family Medicine

## 2022-07-27 ENCOUNTER — Encounter: Payer: Self-pay | Admitting: Family Medicine

## 2022-07-27 VITALS — BP 114/67 | HR 69 | Ht 71.0 in | Wt 253.0 lb

## 2022-07-27 DIAGNOSIS — I10 Essential (primary) hypertension: Secondary | ICD-10-CM | POA: Diagnosis not present

## 2022-07-27 DIAGNOSIS — E1140 Type 2 diabetes mellitus with diabetic neuropathy, unspecified: Secondary | ICD-10-CM | POA: Diagnosis not present

## 2022-07-27 DIAGNOSIS — E785 Hyperlipidemia, unspecified: Secondary | ICD-10-CM

## 2022-07-27 LAB — POCT GLYCOSYLATED HEMOGLOBIN (HGB A1C): Hemoglobin A1C: 9.8 % — AB (ref 4.0–5.6)

## 2022-07-27 MED ORDER — TIRZEPATIDE 5 MG/0.5ML ~~LOC~~ SOAJ: 5.0000 mg | SUBCUTANEOUS | 0 refills | Status: DC

## 2022-07-27 MED ORDER — TIRZEPATIDE 7.5 MG/0.5ML ~~LOC~~ SOAJ: 7.5000 mg | SUBCUTANEOUS | 0 refills | Status: DC

## 2022-07-27 MED ORDER — TIRZEPATIDE 10 MG/0.5ML ~~LOC~~ SOAJ: 10.0000 mg | SUBCUTANEOUS | 0 refills | Status: DC

## 2022-07-27 NOTE — Assessment & Plan Note (Signed)
Continue atorvastatin for management of hyperlipidemia.

## 2022-07-27 NOTE — Progress Notes (Signed)
Antonio Rivera - 66 y.o. male MRN [id number]  Date of birth: 1956-04-24  Subjective Chief Complaint  Patient presents with   Follow-up    HPI Antonio Rivera is a 66 y.o. male here today for follow-up visit.  Reports he is feeling well at this time.  He has stopped Januvia at last visit.  He reports he is taking Rybelsus daily 7 mg.  He is tolerating this well.  He continues on [place].  He is using CGM.  Reports that diet and activity have been great over the past few months.  Blood sugars have been more elevated.  Remains on atorvastatin for associated hyperlipidemia.  He is tolerating this well.  Blood pressure is well-controlled at this time.  Continue home lisinopril 10 mg daily.  Denies side effects.  Has not had chest pain, shortness of breath, or vision changes.    ROS:  A comprehensive ROS was completed and negative except as noted per HPI  Allergies  Allergen Reactions   Oysters [Shellfish Allergy] Nausea And Vomiting   Prednisone Anxiety    Past Medical History:  Diagnosis Date   Diabetes mellitus    type 2   HNP (herniated nucleus pulposus)    C6--7   Hyperlipidemia    Hypertension    OSA on CPAP 1999    Past Surgical History:  Procedure Laterality Date   ruptured disk surgery  1999   C6--7   TONSILLECTOMY  age 3    Social History   Socioeconomic History   Marital status: Significant Other    Spouse name: Laurian Brim   Number of children: 3   Years of education: 13   Highest education level: High school graduate  Occupational History   Occupation: Full-time Engineer, manufacturing systems  Tobacco Use   Smoking status: Former    Types: Pipe    Quit date: 08/07/1989    Years since quitting: 32.9   Smokeless tobacco: Never  Vaping Use   Vaping Use: Never used  Substance and Sexual Activity   Alcohol use: Yes    Alcohol/week: 7.0 standard drinks of alcohol    Types: 7 Shots of liquor per week    Comment: per week   Drug use: No   Sexual activity: Not on  file  Other Topics Concern   Not on file  Social History Narrative   Lives with his significant other. He has three children. He is still working full time. He enjoys wood working.    Social Determinants of Health   Financial Resource Strain: Low Risk  (05/19/2022)   Overall Financial Resource Strain (CARDIA)    Difficulty of Paying Living Expenses: Not hard at all  Food Insecurity: No Food Insecurity (05/19/2022)   Hunger Vital Sign    Worried About Running Out of Food in the Last Year: Never true    Ran Out of Food in the Last Year: Never true  Transportation Needs: No Transportation Needs (05/19/2022)   PRAPARE - Hydrologist (Medical): No    Lack of Transportation (Non-Medical): No  Physical Activity: Inactive (05/19/2022)   Exercise Vital Sign    Days of Exercise per Week: 0 days    Minutes of Exercise per Session: 0 min  Stress: No Stress Concern Present (05/19/2022)   [place]    Feeling of Stress : Not at all  Social Connections: Moderately Integrated (05/19/2022)   Social Connection and Isolation Panel [NHANES]  Frequency of Communication with Friends and Family: More than three times a week    Frequency of Social Gatherings with Friends and Family: Three times a week    Attends Religious Services: More than 4 times per year    Active Member of Clubs or Organizations: No    Attends Archivist Meetings: Never    Marital Status: Living with partner    Family History  Problem Relation Age of Onset   Cancer Mother        skin   Cancer Father 69       prostate/died age 40    Cancer Sister        ovarian cancer   Cancer Brother        skin cancer    Health Maintenance  Topic Date Due   COVID-19 Vaccine (1) Never done   Diabetic kidney evaluation - Urine ACR  10/01/2015   FOOT EXAM  09/18/2019   HEMOGLOBIN A1C  01/26/2023   Diabetic kidney evaluation -  eGFR measurement  03/24/2023   OPHTHALMOLOGY EXAM  04/18/2023   COLONOSCOPY (Pts 45-45yr Insurance coverage will need to be confirmed)  04/27/2023   Medicare Annual Wellness (AWV)  05/20/2023   DTaP/Tdap/Td (3 - Td or Tdap) 07/05/2024   Pneumonia Vaccine 66 Years old  Completed   INFLUENZA VACCINE  Completed   Hepatitis C Screening  Completed   Zoster Vaccines- Shingrix  Completed   HPV VACCINES  Aged Out     ----------------------------------------------------------------------------------------------------------------------------------------------------------------------------------------------------------------- Physical Exam BP 114/67   Pulse 69   Ht _0  (1.803 m)   Wt 253 lb (114.8 kg)   SpO2 96%   BMI 35.29 kg/m   Physical Exam Constitutional:      Appearance: Normal appearance.  HENT:     Head: Normocephalic and atraumatic.  Eyes:     General: No scleral icterus. Neurological:     General: No focal deficit present.     Mental Status: He is alert.  Psychiatric:        Mood and Affect: Mood normal.        Behavior: Behavior normal.     ------------------------------------------------------------------------------------------------------------------------------------------------------------------------------------------------------------------- Assessment and Plan  Type 2 diabetes mellitus with diabetic neuropathy, unspecified (HPowderly Review of CGM data completed through [facility] today.  Glucose is in range only about 13% of the time.  Glucose is high about 40% of the time and very high about 30% of the time.  Diabetes is not well-controlled at this time.  [facility]  Encouraged dietary changes.  Recommend continued monitoring with CGM.  Hyperlipidemia LDL goal <100 Continue atorvastatin for management of hyperlipidemia.  Essential hypertension, benign Blood pressure remains well-controlled at this time.  Recommend  continuation of lisinopril at current strength.   Meds ordered this encounter  Medications   tirzepatide (MOUNJARO) 5 MG/0.5ML Pen    Sig: Inject 5 mg into the skin once a week.    Dispense:  2 mL    Refill:  0    Increase to 7.517mafter 4 weeks   tirzepatide (MOUNJARO) 7.5 MG/0.5ML Pen    Sig: Inject 7.5 mg into the skin once a week. Increase to 1053mfter 4 weeks.    Dispense:  6 mL    Refill:  0   tirzepatide (MOUNJARO) 10 MG/0.5ML Pen    Sig: Inject 10 mg into the skin once a week.    Dispense:  2 mL    Refill:  0  Return in about 3 months (around 10/26/2022) for T2DM.    This visit occurred during the SARS-CoV-2 public health emergency.  Safety protocols were in place, including screening questions prior to the visit, additional usage of staff PPE, and extensive cleaning of exam room while observing appropriate contact time as indicated for disinfecting solutions.

## 2022-07-27 NOTE — Assessment & Plan Note (Signed)
Blood pressure remains well controlled at this time.  Recommend continuation of lisinopril at current strength. °

## 2022-07-27 NOTE — Patient Instructions (Signed)
Finish out rybelsus and start [employer].  Take '5mg'$  x4 weeks then increase to 7.'5mg'$  x4 weeks the '10mg'$ .  See me again in 3 months.

## 2022-07-27 NOTE — Assessment & Plan Note (Signed)
Review of CGM data completed through [facility] today.  Glucose is in range only about 13% of the time.  Glucose is high about 40% of the time and very high about 30% of the time.  Diabetes is not well-controlled at this time.  We discussed switching from Rybelsus to [facility].  Encouraged dietary changes.  Recommend continued monitoring with CGM.

## 2022-09-22 ENCOUNTER — Other Ambulatory Visit: Payer: Self-pay | Admitting: Family Medicine

## 2022-09-22 DIAGNOSIS — M5136 Other intervertebral disc degeneration, lumbar region: Secondary | ICD-10-CM

## 2022-10-30 ENCOUNTER — Ambulatory Visit (INDEPENDENT_AMBULATORY_CARE_PROVIDER_SITE_OTHER): Payer: Medicare Other | Admitting: Family Medicine

## 2022-10-30 ENCOUNTER — Encounter: Payer: Self-pay | Admitting: Family Medicine

## 2022-10-30 VITALS — BP 135/70 | HR 73 | Ht 71.0 in | Wt 245.0 lb

## 2022-10-30 DIAGNOSIS — E1140 Type 2 diabetes mellitus with diabetic neuropathy, unspecified: Secondary | ICD-10-CM | POA: Diagnosis not present

## 2022-10-30 DIAGNOSIS — I10 Essential (primary) hypertension: Secondary | ICD-10-CM

## 2022-10-30 LAB — POCT GLYCOSYLATED HEMOGLOBIN (HGB A1C): HbA1c, POC (controlled diabetic range): 9 % — AB (ref 0.0–7.0)

## 2022-10-30 MED ORDER — OZEMPIC (0.25 OR 0.5 MG/DOSE) 2 MG/3ML ~~LOC~~ SOPN: PEN_INJECTOR | SUBCUTANEOUS | 1 refills | Status: DC

## 2022-10-30 MED ORDER — FREESTYLE LIBRE 3 SENSOR MISC: 6 refills | Status: DC

## 2022-10-30 MED ORDER — OZEMPIC (0.25 OR 0.5 MG/DOSE) 2 MG/3ML ~~LOC~~ SOPN: PEN_INJECTOR | SUBCUTANEOUS | 0 refills | Status: DC

## 2022-10-30 NOTE — Patient Instructions (Signed)
Start Ozempic 0.25mg  weekly for 4 weeks then increase to 0.5mg  weekly.  Continue synjardy.  See me again in 3 months.

## 2022-10-30 NOTE — Assessment & Plan Note (Signed)
Blood pressure remains well-controlled.  Continue lisinopril at current strength. 

## 2022-10-30 NOTE — Assessment & Plan Note (Signed)
Slight interval improvement of his diabetes.  I feel he would benefit from GLP-1.  Given samples of Ozempic and updated prescription sent in.

## 2022-10-30 NOTE — Progress Notes (Signed)
Antonio Rivera - 67 y.o. male MRN [id number]  Date of birth: [birth year]  Subjective Chief Complaint  Patient presents with   Diabetes    HPI Antonio Rivera is a 67 year old male here today for follow-up visit.  He reports he is doing pretty well.  He is using continuous glucose monitor to keep an eye on his blood sugar.  Reports that readings have been better overall but still high.  He was unable to get [facility] previously.  Reports that he never heard anything from the pharmacy or our office.  He does continue on [place].  Denies any significant changes to his diet or exercise patterns.    Blood pressure is well-controlled at this time with lisinopril.  No side effects.  He has not had chest pain, shortness of breath, palpitations, headaches or vision changes.  ROS:  A comprehensive ROS was completed and negative except as noted per HPI  Allergies  Allergen Reactions   Oysters [Shellfish Allergy] Nausea And Vomiting   Prednisone Anxiety    Past Medical History:  Diagnosis Date   Diabetes mellitus    type 2   HNP (herniated nucleus pulposus)    C6--7   Hyperlipidemia    Hypertension    OSA on CPAP 1999    Past Surgical History:  Procedure Laterality Date   ruptured disk surgery  1999   C6--7   TONSILLECTOMY  age 15    Social History   Socioeconomic History   Marital status: Significant Other    Spouse name: Laurian Brim   Number of children: 3   Years of education: 13   Highest education level: High school graduate  Occupational History   Occupation: Full-time Engineer, manufacturing systems  Tobacco Use   Smoking status: Former    Types: Pipe    Quit date: 08/07/1989    Years since quitting: 33.2   Smokeless tobacco: Never  Vaping Use   Vaping Use: Never used  Substance and Sexual Activity   Alcohol use: Yes    Alcohol/week: 7.0 standard drinks of alcohol    Types: 7 Shots of liquor per week    Comment: per week   Drug use: No   Sexual activity: Not on file   Other Topics Concern   Not on file  Social History Narrative   Lives with his significant other. He has three children. He is still working full time. He enjoys wood working.    Social Determinants of Health   Financial Resource Strain: Low Risk  (05/19/2022)   Overall Financial Resource Strain (CARDIA)    Difficulty of Paying Living Expenses: Not hard at all  Food Insecurity: No Food Insecurity (05/19/2022)   Hunger Vital Sign    Worried About Running Out of Food in the Last Year: Never true    Ran Out of Food in the Last Year: Never true  Transportation Needs: No Transportation Needs (05/19/2022)   PRAPARE - Hydrologist (Medical): No    Lack of Transportation (Non-Medical): No  Physical Activity: Inactive (05/19/2022)   Exercise Vital Sign    Days of Exercise per Week: 0 days    Minutes of Exercise per Session: 0 min  Stress: No Stress Concern Present (05/19/2022)   [place]    Feeling of Stress : Not at all  Social Connections: Moderately Integrated (05/19/2022)   Social Connection and Isolation Panel [NHANES]    Frequency of Communication with Friends  and Family: More than three times a week    Frequency of Social Gatherings with Friends and Family: Three times a week    Attends Religious Services: More than 4 times per year    Active Member of Clubs or Organizations: No    Attends Archivist Meetings: Never    Marital Status: Living with partner    Family History  Problem Relation Age of Onset   Cancer Mother        skin   Cancer Father 48       prostate/died age 58    Cancer Sister        ovarian cancer   Cancer Brother        skin cancer    Health Maintenance  Topic Date Due   Diabetic kidney evaluation - Urine ACR  10/01/2015   COVID-19 Vaccine (1) 05/02/2023 (Originally 01/28/1957)   Diabetic kidney evaluation - eGFR measurement  03/24/2023    OPHTHALMOLOGY EXAM  04/18/2023   COLONOSCOPY (Pts 45-42yrs Insurance coverage will need to be confirmed)  04/27/2023   HEMOGLOBIN A1C  05/02/2023   Medicare Annual Wellness (AWV)  05/20/2023   FOOT EXAM  10/30/2023   DTaP/Tdap/Td (3 - Td or Tdap) 07/05/2024   Pneumonia Vaccine 1+ Years old  Completed   INFLUENZA VACCINE  Completed   Hepatitis C Screening  Completed   Zoster Vaccines- Shingrix  Completed   HPV VACCINES  Aged Out     ----------------------------------------------------------------------------------------------------------------------------------------------------------------------------------------------------------------- Physical Exam BP 135/70 (BP Location: Left Arm, Patient Position: Sitting, Cuff Size: Large)   Pulse 73   Ht 5\' 11"  (1.803 m)   Wt 245 lb (111.1 kg)   SpO2 98%   BMI 34.17 kg/m   Physical Exam Constitutional:      Appearance: Normal appearance.  HENT:     Head: Normocephalic and atraumatic.  Eyes:     General: No scleral icterus. Cardiovascular:     Rate and Rhythm: Normal rate and regular rhythm.  Pulmonary:     Effort: Pulmonary effort is normal.     Breath sounds: Normal breath sounds.  Neurological:     Mental Status: He is alert.  Psychiatric:        Mood and Affect: Mood normal.        Behavior: Behavior normal.     ------------------------------------------------------------------------------------------------------------------------------------------------------------------------------------------------------------------- Assessment and Plan  Essential hypertension, benign Blood pressure remains well-controlled.  Continue lisinopril at current strength.  Type 2 diabetes mellitus with diabetic neuropathy, unspecified (HCC) Slight interval improvement of his diabetes.  I feel he would benefit from GLP-1.  Given samples of Ozempic and updated prescription sent in.   Meds ordered this encounter  Medications   Continuous  Blood Gluc Sensor (FREESTYLE LIBRE 3 SENSOR) MISC    Sig: Place 1 sensor on the skin every 14 days. Use to check glucose continuously    Dispense:  2 each    Refill:  6   Semaglutide,0.25 or 0.5MG /DOS, (OZEMPIC, 0.25 OR 0.5 MG/DOSE,) 2 MG/3ML SOPN    Sig: Inject 0.5mg  weekly.    Dispense:  3 mL    Refill:  1   Semaglutide,0.25 or 0.5MG /DOS, (OZEMPIC, 0.25 OR 0.5 MG/DOSE,) 2 MG/3ML SOPN    Sig: Inject 0.25mg  weekly x4 weeks then increase to 0.5mg  weekly  Exp: 04/06/2024 Lot: [id number]    Dispense:  6 mL    Refill:  0    Return in about 3 months (around 01/30/2023).    This visit occurred during the SARS-CoV-2  public health emergency.  Safety protocols were in place, including screening questions prior to the visit, additional usage of staff PPE, and extensive cleaning of exam room while observing appropriate contact time as indicated for disinfecting solutions.

## 2022-10-31 LAB — POCT UA - MICROALBUMIN
Creatinine, POC: 50 mg/dL
Microalbumin Ur, POC: 10 mg/L

## 2022-10-31 NOTE — Addendum Note (Signed)
Addended by: Peggye Ley on: 10/31/2022 10:06 AM   Modules accepted: Orders

## 2022-11-01 ENCOUNTER — Telehealth: Payer: Self-pay

## 2022-11-01 NOTE — Telephone Encounter (Signed)
Returned pt's call concerning Ozempic.   Pt thinks the medication includes polyglycol (antifreeze) and fentanyl. He is unwilling to take a medication with "those ingredients". Pt requesting to not take medication and continue to try lowering A1C thru dietary means.   Dr. Zigmund Daniel has been advised and will clarify medication ingredients with patient.

## 2022-11-01 NOTE — Telephone Encounter (Signed)
Patient with concerns of propylene glycol and phenol contained in Ozempic.  Discussed that these are recognized as safe in the amounts present in this medication. He still remains hesitant and will work on improving this on his own.

## 2022-11-08 ENCOUNTER — Other Ambulatory Visit: Payer: Self-pay

## 2022-11-08 DIAGNOSIS — E1140 Type 2 diabetes mellitus with diabetic neuropathy, unspecified: Secondary | ICD-10-CM

## 2022-11-08 MED ORDER — FREESTYLE LIBRE 3 SENSOR MISC: 3 refills | Status: DC

## 2022-11-22 ENCOUNTER — Other Ambulatory Visit: Payer: Self-pay

## 2022-11-22 DIAGNOSIS — E1140 Type 2 diabetes mellitus with diabetic neuropathy, unspecified: Secondary | ICD-10-CM

## 2022-11-22 MED ORDER — FREESTYLE LIBRE 3 SENSOR MISC: 3 refills | Status: DC

## 2022-12-15 ENCOUNTER — Other Ambulatory Visit: Payer: Self-pay

## 2022-12-15 DIAGNOSIS — E119 Type 2 diabetes mellitus without complications: Secondary | ICD-10-CM

## 2022-12-15 MED ORDER — SYNJARDY 12.5-1000 MG PO TABS: 1.0000 | ORAL_TABLET | Freq: Two times a day (BID) | ORAL | 3 refills | Status: DC

## 2022-12-25 ENCOUNTER — Other Ambulatory Visit: Payer: Self-pay | Admitting: Family Medicine

## 2022-12-25 DIAGNOSIS — I152 Hypertension secondary to endocrine disorders: Secondary | ICD-10-CM

## 2023-01-06 ENCOUNTER — Other Ambulatory Visit: Payer: Self-pay | Admitting: Family Medicine

## 2023-01-06 DIAGNOSIS — E785 Hyperlipidemia, unspecified: Secondary | ICD-10-CM

## 2023-02-12 ENCOUNTER — Ambulatory Visit: Payer: Medicare Other | Admitting: Family Medicine

## 2023-02-17 ENCOUNTER — Other Ambulatory Visit: Payer: Self-pay | Admitting: Family Medicine

## 2023-02-20 ENCOUNTER — Ambulatory Visit (INDEPENDENT_AMBULATORY_CARE_PROVIDER_SITE_OTHER): Payer: Medicare Other | Admitting: Family Medicine

## 2023-02-20 ENCOUNTER — Encounter: Payer: Self-pay | Admitting: Family Medicine

## 2023-02-20 VITALS — BP 110/66 | HR 71 | Ht 71.0 in | Wt 245.0 lb

## 2023-02-20 DIAGNOSIS — E785 Hyperlipidemia, unspecified: Secondary | ICD-10-CM

## 2023-02-20 DIAGNOSIS — E119 Type 2 diabetes mellitus without complications: Secondary | ICD-10-CM

## 2023-02-20 DIAGNOSIS — E1140 Type 2 diabetes mellitus with diabetic neuropathy, unspecified: Secondary | ICD-10-CM

## 2023-02-20 DIAGNOSIS — I10 Essential (primary) hypertension: Secondary | ICD-10-CM

## 2023-02-20 DIAGNOSIS — R972 Elevated prostate specific antigen [PSA]: Secondary | ICD-10-CM

## 2023-02-20 LAB — POCT GLYCOSYLATED HEMOGLOBIN (HGB A1C): HbA1c, POC (controlled diabetic range): 7.7 % — AB (ref 0.0–7.0)

## 2023-02-20 NOTE — Assessment & Plan Note (Signed)
Tolerating atorvastatin well.  Updated lipid panel ordered.  He will return to have this completed when fasting.

## 2023-02-20 NOTE — Progress Notes (Signed)
Antonio Rivera BLUE WINTHER - 67 y.o. male MRN [id number]  Date of birth: [birth year]  Subjective Chief Complaint  Patient presents with   Diabetes    HPI  Antonio Rivera is a 67 year old male here today for follow-up visit.  He reports he is doing pretty well at this time.  He never started Ozempic after last visit.  His reasoning for this is that he is concerned that it contains "antifreeze".  He was continued on Synjardy has made some changes to his diet.  He is pretty active at his job.  His A1c has improved to 7.7%.  He is tolerating atorvastatin well for associated hyperlipidemia.  Blood pressure mains well-controlled with lisinopril 10 mg daily.  No side effects at this time.  Denies chest pain, shortness of breath, palpitations, headaches or vision changes.  ROS:  A comprehensive ROS was completed and negative except as noted per HPI   Allergies  Allergen Reactions   Oysters [Shellfish Allergy] Nausea And Vomiting   Prednisone Anxiety    Past Medical History:  Diagnosis Date   Diabetes mellitus    type 2   HNP (herniated nucleus pulposus)    C6--7   Hyperlipidemia    Hypertension    OSA on CPAP 1999    Past Surgical History:  Procedure Laterality Date   ruptured disk surgery  1999   C6--7   TONSILLECTOMY  age 18    Social History   Socioeconomic History   Marital status: Significant Other    Spouse name: Antonio Rivera   Number of children: 3   Years of education: 13   Highest education level: High school graduate  Occupational History   Occupation: Full-time Geneticist, molecular  Tobacco Use   Smoking status: Former    Types: Pipe    Quit date: 08/07/1989    Years since quitting: 33.5   Smokeless tobacco: Never  Vaping Use   Vaping status: Never Used  Substance and Sexual Activity   Alcohol use: Yes    Alcohol/week: 7.0 standard drinks of alcohol    Types: 7 Shots of liquor per week    Comment: per week   Drug use: No   Sexual activity: Not on file  Other  Topics Concern   Not on file  Social History Narrative   Lives with his significant other. He has three children. He is still working full time. He enjoys wood working.    Social Determinants of Health   Financial Resource Strain: Low Risk  (05/19/2022)   Overall Financial Resource Strain (CARDIA)    Difficulty of Paying Living Expenses: Not hard at all  Food Insecurity: No Food Insecurity (05/19/2022)   Hunger Vital Sign    Worried About Running Out of Food in the Last Year: Never true    Ran Out of Food in the Last Year: Never true  Transportation Needs: No Transportation Needs (05/19/2022)   PRAPARE - Administrator, Civil Service (Medical): No    Lack of Transportation (Non-Medical): No  Physical Activity: Inactive (05/19/2022)   Exercise Vital Sign    Days of Exercise per Week: 0 days    Minutes of Exercise per Session: 0 min  Stress: No Stress Concern Present (05/19/2022)   [employer] of Occupational Health - Occupational Stress Questionnaire    Feeling of Stress : Not at all  Social Connections: Moderately Integrated (05/19/2022)   Social Connection and Isolation Panel [NHANES]    Frequency of Communication with Friends and Family:  More than three times a week    Frequency of Social Gatherings with Friends and Family: Three times a week    Attends Religious Services: More than 4 times per year    Active Member of Clubs or Organizations: No    Attends Banker Meetings: Never    Marital Status: Living with partner    Family History  Problem Relation Age of Onset   Cancer Mother        skin   Cancer Father 91       prostate/died age 75    Cancer Sister        ovarian cancer   Cancer Brother        skin cancer    Health Maintenance  Topic Date Due   Colonoscopy  04/27/2023   COVID-19 Vaccine (1 - 2023-24 season) 05/02/2023 (Originally 04/07/2022)   INFLUENZA VACCINE  03/08/2023   Diabetic kidney evaluation - eGFR measurement   03/24/2023   OPHTHALMOLOGY EXAM  04/18/2023   Medicare Annual Wellness (AWV)  05/20/2023   HEMOGLOBIN A1C  08/23/2023   Diabetic kidney evaluation - Urine ACR  10/30/2023   FOOT EXAM  10/30/2023   DTaP/Tdap/Td (3 - Td or Tdap) 07/05/2024   Pneumonia Vaccine 93+ Years old  Completed   Hepatitis C Screening  Completed   Zoster Vaccines- Shingrix  Completed   HPV VACCINES  Aged Out     ----------------------------------------------------------------------------------------------------------------------------------------------------------------------------------------------------------------- Physical Exam BP 110/66 (BP Location: Left Arm, Patient Position: Sitting, Cuff Size: Large)   Pulse 71   Ht 5\' 11"  (1.803 m)   Wt 245 lb (111.1 kg)   SpO2 97%   BMI 34.17 kg/m   Physical Exam Constitutional:      Appearance: Normal appearance.  Eyes:     General: No scleral icterus. Cardiovascular:     Rate and Rhythm: Normal rate and regular rhythm.  Pulmonary:     Effort: Pulmonary effort is normal.     Breath sounds: Normal breath sounds.  Neurological:     Mental Status: He is alert.  Psychiatric:        Mood and Affect: Mood normal.        Behavior: Behavior normal.     ------------------------------------------------------------------------------------------------------------------------------------------------------------------------------------------------------------------- Assessment and Plan  Essential hypertension, benign Patient blood pressure mains well-controlled.  He will continue lisinopril at current strength of 10 mg daily.  Type 2 diabetes mellitus with diabetic neuropathy, unspecified (HCC) Blood sugars remain elevated but improved from previous visit.  Encouraged continued dietary changes well as increased activity.  I discussed with him that one of the ingredients in Ozempic, propylene glycol, can be used as an adjunct or foods and is sometimes used as a  Airline pilot for aircraft and/or foods that antifreeze and plumbing fixtures.  He remains hesitant to start this.  Hyperlipidemia LDL goal <100 Tolerating atorvastatin well.  Updated lipid panel ordered.  He will return to have this completed when fasting.  Increased prostate specific antigen (PSA) velocity Update PSA levels.   No orders of the defined types were placed in this encounter.   Return in about 4 months (around 06/23/2023) for T2DM.    This visit occurred during the SARS-CoV-2 public health emergency.  Safety protocols were in place, including screening questions prior to the visit, additional usage of staff PPE, and extensive cleaning of exam room while observing appropriate contact time as indicated for disinfecting solutions.

## 2023-02-20 NOTE — Patient Instructions (Signed)
Have labs completed when fasting.  See me again in about 4 months.

## 2023-02-20 NOTE — Assessment & Plan Note (Signed)
Patient blood pressure mains well-controlled.  He will continue lisinopril at current strength of 10 mg daily.

## 2023-02-20 NOTE — Assessment & Plan Note (Signed)
Blood sugars remain elevated but improved from previous visit.  Encouraged continued dietary changes well as increased activity.  I discussed with him that one of the ingredients in Ozempic, propylene glycol, can be used as an adjunct or foods and is sometimes used as a Airline pilot for aircraft and/or foods that antifreeze and plumbing fixtures.  He remains hesitant to start this.

## 2023-02-20 NOTE — Assessment & Plan Note (Signed)
Update PSA levels. °

## 2023-03-01 ENCOUNTER — Other Ambulatory Visit: Payer: Self-pay | Admitting: Family Medicine

## 2023-03-01 DIAGNOSIS — M5136 Other intervertebral disc degeneration, lumbar region: Secondary | ICD-10-CM

## 2023-03-02 NOTE — Telephone Encounter (Signed)
Reqeusting rx rf of gabapentin 800mg  Last written 09/22/2022 Last OV 02/20/2023 Upcoming appt 06/25/2023

## 2023-03-22 LAB — HM DIABETES EYE EXAM

## 2023-05-16 DIAGNOSIS — B356 Tinea cruris: Secondary | ICD-10-CM | POA: Diagnosis not present

## 2023-05-16 DIAGNOSIS — L720 Epidermal cyst: Secondary | ICD-10-CM | POA: Diagnosis not present

## 2023-05-16 DIAGNOSIS — B351 Tinea unguium: Secondary | ICD-10-CM | POA: Diagnosis not present

## 2023-06-19 ENCOUNTER — Ambulatory Visit (INDEPENDENT_AMBULATORY_CARE_PROVIDER_SITE_OTHER): Payer: Medicare Other | Admitting: Medical-Surgical

## 2023-06-19 DIAGNOSIS — Z0000 Encounter for general adult medical examination without abnormal findings: Secondary | ICD-10-CM

## 2023-06-19 DIAGNOSIS — Z1211 Encounter for screening for malignant neoplasm of colon: Secondary | ICD-10-CM

## 2023-06-19 NOTE — Patient Instructions (Signed)
MEDICARE ANNUAL WELLNESS VISIT Health Maintenance Summary and Written Plan of Care  Mr. Antonio Rivera ,  Thank you for allowing me to perform your Medicare Annual Wellness Visit and for your ongoing commitment to your health.   Health Maintenance & Immunization History Health Maintenance  Topic Date Due   OPHTHALMOLOGY EXAM  06/19/2023 (Originally 04/18/2023)   INFLUENZA VACCINE  06/22/2023 (Originally 03/08/2023)   Diabetic kidney evaluation - eGFR measurement  06/26/2023 (Originally 03/24/2023)   COVID-19 Vaccine (1 - 2023-24 season) 07/05/2023 (Originally 04/08/2023)   Colonoscopy  06/18/2024 (Originally 04/27/2023)   HEMOGLOBIN A1C  08/23/2023   Diabetic kidney evaluation - Urine ACR  10/30/2023   FOOT EXAM  10/30/2023   Medicare Annual Wellness (AWV)  06/18/2024   DTaP/Tdap/Td (3 - Td or Tdap) 07/05/2024   Pneumonia Vaccine 64+ Years old  Completed   Hepatitis C Screening  Completed   Zoster Vaccines- Shingrix  Completed   HPV VACCINES  Aged Out   Immunization History  Administered Date(s) Administered   Fluad Quad(high Dose 65+) 04/15/2021, 05/22/2022   Influenza Whole 05/24/2009, 06/14/2012, 05/21/2013   Influenza,inj,Quad PF,6+ Mos 03/30/2014, 03/31/2015, 04/07/2016, 04/24/2017, 05/16/2018, 05/19/2019, 05/26/2020   PNEUMOCOCCAL CONJUGATE-20 04/15/2021   Pneumococcal Polysaccharide-23 04/02/2013   Td 08/08/2007   Tdap 07/05/2014   Zoster Recombinant(Shingrix) 03/18/2019, 05/19/2019    These are the patient goals that we discussed:  Goals Addressed               This Visit's Progress     Patient Stated (pt-stated)        06/19/2023 AWV Goal: Diabetes Management  Patient will maintain an A1C level below 7.0 Patient will not develop any diabetic foot complications Patient will not experience any hypoglycemic episodes over the next 3 months Patient will notify our office of any CBG readings outside of the provider recommended range by calling [phone] Patient will  adhere to provider recommendations for diabetes management  Patient Self Management Activities take all medications as prescribed and report any negative side effects monitor and record blood sugar readings as directed adhere to a low carbohydrate diet that incorporates lean proteins, vegetables, whole grains, low glycemic fruits check feet daily noting any sores, cracks, injuries, or callous formations see PCP or podiatrist if he notices any changes in his legs, feet, or toenails Patient will visit PCP and have an A1C level checked every 3 to 6 months as directed  have a yearly eye exam to monitor for vascular changes associated with diabetes and will request that the report be sent to his pcp.  consult with his PCP regarding any changes in his health or new or worsening symptoms          This is a list of Health Maintenance Items that are overdue or due now: Influenza vaccine Colorectal cancer screening Urine ACR Diabetic kidney evaluation Diabetic eye exam- completed in August. Request sent.  Orders/Referrals Placed Today: Orders Placed This Encounter  Procedures   Ambulatory referral to Gastroenterology (for Colonoscopy)    Referral Priority:   Routine    Referral Type:   Consultation    Referral Reason:   Specialty Services Required    Number of Visits Requested:   1    (Contact our referral department at [phone] if you have not spoken with someone about your referral appointment within the next 5 days)    Follow-up Plan Follow-up with Antonio Coombe, DO as planned Schedule influenza vaccine at the pharmacy or in office.  Complete lab appointment  prior to the appointment with PCP.  Medicare wellness visit in one year.  Patient will access AVS on my chart.      Health Maintenance, Male Adopting a healthy lifestyle and getting preventive care are important in promoting health and wellness. Ask your health care provider about: The right schedule for you to have  regular tests and exams. Things you can do on your own to prevent diseases and keep yourself healthy. What should I know about diet, weight, and exercise? Eat a healthy diet  Eat a diet that includes plenty of vegetables, fruits, low-fat dairy products, and lean protein. Do not eat a lot of foods that are high in solid fats, added sugars, or sodium. Maintain a healthy weight Body mass index (BMI) is a measurement that can be used to identify possible weight problems. It estimates body fat based on height and weight. Your health care provider can help determine your BMI and help you achieve or maintain a healthy weight. Get regular exercise Get regular exercise. This is one of the most important things you can do for your health. Most adults should: Exercise for at least 150 minutes each week. The exercise should increase your heart rate and make you sweat (moderate-intensity exercise). Do strengthening exercises at least twice a week. This is in addition to the moderate-intensity exercise. Spend less time sitting. Even light physical activity can be beneficial. Watch cholesterol and blood lipids Have your blood tested for lipids and cholesterol at 68 years of age, then have this test every 5 years. You may need to have your cholesterol levels checked more often if: Your lipid or cholesterol levels are high. You are older than 68 years of age. You are at high risk for heart disease. What should I know about cancer screening? Many types of cancers can be detected early and may often be prevented. Depending on your health history and family history, you may need to have cancer screening at various ages. This may include screening for: Colorectal cancer. Prostate cancer. Skin cancer. Lung cancer. What should I know about heart disease, diabetes, and high blood pressure? Blood pressure and heart disease High blood pressure causes heart disease and increases the risk of stroke. This is more  likely to develop in people who have high blood pressure readings or are overweight. Talk with your health care provider about your target blood pressure readings. Have your blood pressure checked: Every 3-5 years if you are 18-23 years of age. Every year if you are 63 years old or older. If you are between the ages of 67 and 26 and are a current or former smoker, ask your health care provider if you should have a one-time screening for abdominal aortic aneurysm (AAA). Diabetes Have regular diabetes screenings. This checks your fasting blood sugar level. Have the screening done: Once every three years after age 40 if you are at a normal weight and have a low risk for diabetes. More often and at a younger age if you are overweight or have a high risk for diabetes. What should I know about preventing infection? Hepatitis B If you have a higher risk for hepatitis B, you should be screened for this virus. Talk with your health care provider to find out if you are at risk for hepatitis B infection. Hepatitis C Blood testing is recommended for: Everyone born from [birth year] through 1966. Anyone with known risk factors for hepatitis C. Sexually transmitted infections (STIs) You should be screened each year for STIs,  including gonorrhea and chlamydia, if: You are sexually active and are younger than 68 years of age. You are older than 68 years of age and your health care provider tells you that you are at risk for this type of infection. Your sexual activity has changed since you were last screened, and you are at increased risk for chlamydia or gonorrhea. Ask your health care provider if you are at risk. Ask your health care provider about whether you are at high risk for HIV. Your health care provider may recommend a prescription medicine to help prevent HIV infection. If you choose to take medicine to prevent HIV, you should first get tested for HIV. You should then be tested every 3 months for as long as  you are taking the medicine. Follow these instructions at home: Alcohol use Do not drink alcohol if your health care provider tells you not to drink. If you drink alcohol: Limit how much you have to 0-2 drinks a day. Know how much alcohol is in your drink. In the [place] one drink equals one 12 oz bottle of beer (355 mL), one 5 oz glass of wine (148 mL), or one 1 oz glass of hard liquor (44 mL). Lifestyle Do not use any products that contain nicotine or tobacco. These products include cigarettes, chewing tobacco, and vaping devices, such as e-cigarettes. If you need help quitting, ask your health care provider. Do not use street drugs. Do not share needles. Ask your health care provider for help if you need support or information about quitting drugs. General instructions Schedule regular health, dental, and eye exams. Stay current with your vaccines. Tell your health care provider if: You often feel depressed. You have ever been abused or do not feel safe at home. Summary Adopting a healthy lifestyle and getting preventive care are important in promoting health and wellness. Follow your health care provider's instructions about healthy diet, exercising, and getting tested or screened for diseases. Follow your health care provider's instructions on monitoring your cholesterol and blood pressure. This information is not intended to replace advice given to you by your health care provider. Make sure you discuss any questions you have with your health care provider. Document Revised: 12/13/2020 Document Reviewed: 12/13/2020 Elsevier Patient Education  2024 [employer].

## 2023-06-19 NOTE — Progress Notes (Signed)
MEDICARE ANNUAL WELLNESS VISIT  06/19/2023  Telephone Visit Disclaimer This Medicare AWV was conducted by telephone due to national recommendations for restrictions regarding the COVID-19 Pandemic (e.g. social distancing).  I verified, using two identifiers, that I am speaking with Antonio Rivera or their authorized healthcare agent. I discussed the limitations, risks, security, and privacy concerns of performing an evaluation and management service by telephone and the potential availability of an in-person appointment in the future. The patient expressed understanding and agreed to proceed.  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  Subjective:    Antonio Rivera is a 67 y.o. male patient of Antonio Coombe, DO who had a Medicare Annual Wellness Visit today via telephone. Antonio Rivera is Working full time and lives with their partner. he has 3 children. he reports that he is socially active and does interact with friends/family regularly. he is moderately physically active and enjoys woodworking.  Patient Care Team: Antonio Coombe, DO as PCP - General (Family Medicine)     06/19/2023    8:10 AM 05/19/2022    9:18 AM 04/15/2021    3:23 PM  Advanced Directives  Does Patient Have a Medical Advance Directive? No No No  Would patient like information on creating a medical advance directive? No - Patient declined No - Patient declined No - Patient declined    Hospital Utilization Over the Past 12 Months: # of hospitalizations or ER visits: 0 # of surgeries: 0  Review of Systems    Patient reports that his overall health is unchanged compared to last year.  History obtained from chart review and the patient  Patient Reported Readings (BP, Pulse, CBG, Weight, etc) none Per patient no change in vitals since last visit, unable to obtain new vitals due to telehealth visit  Pain Assessment Pain : No/denies pain     Current Medications & Allergies  (verified) Allergies as of 06/19/2023       Reactions   Oysters [shellfish Allergy] Nausea And Vomiting   Prednisone Anxiety        Medication List        Accurate as of June 19, 2023  8:25 AM. If you have any questions, ask your nurse or doctor.          STOP taking these medications    Ozempic (0.25 or 0.5 MG/DOSE) 2 MG/3ML Sopn Generic drug: Semaglutide(0.25 or 0.5MG /DOS)       TAKE these medications    acetaminophen 500 MG tablet Commonly known as: TYLENOL 1000 g p.o. twice daily to 3 times daily   albuterol 108 (90 Base) MCG/ACT inhaler Commonly known as: VENTOLIN HFA USE 1 TO 2 INHALATIONS BY MOUTH  EVERY 6 HOURS AS NEEDED FOR  WHEEZING OR SHORTNESS OF BREATH   AMBULATORY NON FORMULARY MEDICATION CPAP mask and supplies as needed.  Also provide 30 day download.  Send to [phone] (fax) Dx: OSA   aspirin EC 81 MG tablet Take 1 tablet (81 mg total) by mouth daily.   atorvastatin 20 MG tablet Commonly known as: LIPITOR TAKE 1 TABLET BY MOUTH ONCE  DAILY   cetirizine 10 MG tablet Commonly known as: ZYRTEC Take 1 tablet (10 mg total) by mouth daily.   famotidine 20 MG tablet Commonly known as: PEPCID TAKE 2 TABLETS BY MOUTH DAILY   Fish Oil Oil by Does not apply route.   FreeStyle Libre 3 Sensor Misc Place 1 sensor on the skin every 14 days. Use  to check glucose continuously   gabapentin 800 MG tablet Commonly known as: NEURONTIN TAKE 1 AND 1/2 TO 2 TABLETS BY  MOUTH AT NIGHT   lisinopril 10 MG tablet Commonly known as: ZESTRIL TAKE 1 TABLET BY MOUTH DAILY   MULTIVITAMIN PO Take 1 tablet by mouth every morning.   Nystatin Powd Apply liberally to affected area 2 times per day   Red Yeast Rice 600 MG Caps 2 caps every evening   Synjardy 12.12-998 MG Tabs Generic drug: Empagliflozin-metFORMIN HCl Take 1 tablet by mouth 2 (two) times daily.   terbinafine 250 MG tablet Commonly known as: LAMISIL Take 250 mg by mouth daily.    triamcinolone cream 0.1 % Commonly known as: KENALOG SMARTSIG:1 Application Topical 2-3 Times Daily        History (reviewed): Past Medical History:  Diagnosis Date   Diabetes mellitus    type 2   HNP (herniated nucleus pulposus)    C6--7   Hyperlipidemia    Hypertension    OSA on CPAP 1999   Past Surgical History:  Procedure Laterality Date   ruptured disk surgery  1999   C6--7   TONSILLECTOMY  age 25   Family History  Problem Relation Age of Onset   Cancer Mother        skin   Cancer Father 84       prostate/died age 29    Cancer Sister        ovarian cancer   Cancer Brother        skin cancer   Social History   Socioeconomic History   Marital status: Significant Other    Spouse name: Antonio Rivera   Number of children: 3   Years of education: 13   Highest education level: High school graduate  Occupational History   Occupation: Full-time Geneticist, molecular  Tobacco Use   Smoking status: Former    Types: Pipe    Quit date: 08/07/1989    Years since quitting: 33.8   Smokeless tobacco: Never  Vaping Use   Vaping status: Never Used  Substance and Sexual Activity   Alcohol use: Yes    Alcohol/week: 7.0 standard drinks of alcohol    Types: 7 Shots of liquor per week    Comment: per week   Drug use: No   Sexual activity: Not on file  Other Topics Concern   Not on file  Social History Narrative   Lives with his significant other. He has three children. He is still working full time. He enjoys wood working.    Social Determinants of Health   Financial Resource Strain: Low Risk  (06/19/2023)   Overall Financial Resource Strain (CARDIA)    Difficulty of Paying Living Expenses: Not hard at all  Food Insecurity: No Food Insecurity (06/19/2023)   Hunger Vital Sign    Worried About Running Out of Food in the Last Year: Never true    Ran Out of Food in the Last Year: Never true  Transportation Needs: No Transportation Needs (06/19/2023)   PRAPARE -  Administrator, Civil Service (Medical): No    Lack of Transportation (Non-Medical): No  Physical Activity: Sufficiently Active (06/19/2023)   Exercise Vital Sign    Days of Exercise per Week: 7 days    Minutes of Exercise per Session: 30 min  Stress: No Stress Concern Present (06/19/2023)   [employer] of Occupational Health - Occupational Stress Questionnaire    Feeling of Stress : Not at all  Social Connections: Moderately Integrated (06/19/2023)   Social Connection and Isolation Panel [NHANES]    Frequency of Communication with Friends and Family: More than three times a week    Frequency of Social Gatherings with Friends and Family: More than three times a week    Attends Religious Services: More than 4 times per year    Active Member of [employer] or Organizations: No    Attends Banker Meetings: Never    Marital Status: Living with partner    Activities of Daily Living    06/19/2023    8:12 AM  In your present state of health, do you have any difficulty performing the following activities:  Hearing? 0  Vision? 0  Difficulty concentrating or making decisions? 0  Walking or climbing stairs? 0  Dressing or bathing? 0  Doing errands, shopping? 0  Preparing Food and eating ? N  Using the Toilet? N  In the past six months, have you accidently leaked urine? Y  Comment urgency  Do you have problems with loss of bowel control? N  Managing your Medications? N  Managing your Finances? N  Housekeeping or managing your Housekeeping? N    Patient Education/ Literacy How often do you need to have someone help you when you read instructions, pamphlets, or other written materials from your doctor or pharmacy?: 1 - Never What is the last grade level you completed in school?: 12th grade  Exercise    Diet Patient reports consuming 2 meals a day and 0 snack(s) a day Patient reports that his primary diet is: Regular Patient reports that she does have  regular access to food.   Depression Screen    06/19/2023    8:11 AM 10/30/2022    4:13 PM 07/27/2022    4:41 PM 05/19/2022    9:19 AM 03/27/2022    3:54 PM 09/27/2021    3:50 PM 04/15/2021    3:24 PM  PHQ 2/9 Scores  PHQ - 2 Score 0 0 0 0 0 0 0     Fall Risk    06/19/2023    8:11 AM 10/30/2022    4:13 PM 07/27/2022    4:41 PM 05/19/2022    9:18 AM 03/27/2022    3:54 PM  Fall Risk   Falls in the past year? 0 0 0 0 0  Number falls in past yr: 0 0 0 0 0  Injury with Fall? 0 0 0 0 0  Risk for fall due to : No Fall Risks No Fall Risks No Fall Risks No Fall Risks No Fall Risks  Follow up Falls evaluation completed Falls evaluation completed Falls evaluation completed Falls evaluation completed Falls evaluation completed     Objective:  KEONDRAE KABALA seemed alert and oriented and he participated appropriately during our telephone visit.  Blood Pressure Weight BMI  BP Readings from Last 3 Encounters:  02/20/23 110/66  10/30/22 135/70  07/27/22 114/67   Wt Readings from Last 3 Encounters:  02/20/23 245 lb (111.1 kg)  10/30/22 245 lb (111.1 kg)  07/27/22 253 lb (114.8 kg)   BMI Readings from Last 1 Encounters:  02/20/23 34.17 kg/m    *Unable to obtain current vital signs, weight, and BMI due to telephone visit type  Hearing/Vision  Thelonious did not seem to have difficulty with hearing/understanding during the telephone conversation Reports that he has had a formal eye exam by an eye care professional within the past year Reports that he has not had a  formal hearing evaluation within the past year *Unable to fully assess hearing and vision during telephone visit type  Cognitive Function:    06/19/2023    8:15 AM 05/19/2022    9:23 AM 04/15/2021    3:35 PM  6CIT Screen  What Year? 0 points 0 points 0 points  What month? 0 points 0 points 0 points  What time? 0 points 0 points 0 points  Count back from 20 0 points 0 points 0 points  Months in reverse 4 points 0 points 0  points  Repeat phrase 2 points 2 points 2 points  Total Score 6 points 2 points 2 points   (Normal:0-7, Significant for Dysfunction: >8)  Normal Cognitive Function Screening: Yes   Immunization & Health Maintenance Record Immunization History  Administered Date(s) Administered   Fluad Quad(high Dose 65+) 04/15/2021, 05/22/2022   Influenza Whole 05/24/2009, 06/14/2012, 05/21/2013   Influenza,inj,Quad PF,6+ Mos 03/30/2014, 03/31/2015, 04/07/2016, 04/24/2017, 05/16/2018, 05/19/2019, 05/26/2020   PNEUMOCOCCAL CONJUGATE-20 04/15/2021   Pneumococcal Polysaccharide-23 04/02/2013   Td 08/08/2007   Tdap 07/05/2014   Zoster Recombinant(Shingrix) 03/18/2019, 05/19/2019    Health Maintenance  Topic Date Due   OPHTHALMOLOGY EXAM  06/19/2023 (Originally 04/18/2023)   INFLUENZA VACCINE  06/22/2023 (Originally 03/08/2023)   Diabetic kidney evaluation - eGFR measurement  06/26/2023 (Originally 03/24/2023)   COVID-19 Vaccine (1 - 2023-24 season) 07/05/2023 (Originally 04/08/2023)   Colonoscopy  06/18/2024 (Originally 04/27/2023)   HEMOGLOBIN A1C  08/23/2023   Diabetic kidney evaluation - Urine ACR  10/30/2023   FOOT EXAM  10/30/2023   Medicare Annual Wellness (AWV)  06/18/2024   DTaP/Tdap/Td (3 - Td or Tdap) 07/05/2024   Pneumonia Vaccine 56+ Years old  Completed   Hepatitis C Screening  Completed   Zoster Vaccines- Shingrix  Completed   HPV VACCINES  Aged Out       Assessment  This is a routine wellness examination for [facility].  Health Maintenance: Due or Overdue There are no preventive care reminders to display for this patient.   Antonio Rivera does not need a referral for Community Assistance: Care Management:   no Social Work:    no Prescription Assistance:  no Nutrition/Diabetes Education:  no   Plan:  Personalized Goals  Goals Addressed               This Visit's Progress     Patient Stated (pt-stated)        06/19/2023 AWV Goal: Diabetes Management  Patient  will maintain an A1C level below 7.0 Patient will not develop any diabetic foot complications Patient will not experience any hypoglycemic episodes over the next 3 months Patient will notify our office of any CBG readings outside of the provider recommended range by calling [phone] Patient will adhere to provider recommendations for diabetes management  Patient Self Management Activities take all medications as prescribed and report any negative side effects monitor and record blood sugar readings as directed adhere to a low carbohydrate diet that incorporates lean proteins, vegetables, whole grains, low glycemic fruits check feet daily noting any sores, cracks, injuries, or callous formations see PCP or podiatrist if he notices any changes in his legs, feet, or toenails Patient will visit PCP and have an A1C level checked every 3 to 6 months as directed  have a yearly eye exam to monitor for vascular changes associated with diabetes and will request that the report be sent to his pcp.  consult with his PCP regarding any changes in his  health or new or worsening symptoms        Personalized Health Maintenance & Screening Recommendations  Influenza vaccine Colorectal cancer screening Urine ACR Diabetic kidney evaluation Diabetic eye exam- completed in August. Request sent.  Lung Cancer Screening Recommended: no (Low Dose CT Chest recommended if Age 7-80 years, 20 pack-year currently smoking OR have quit w/in past 15 years) Hepatitis C Screening recommended: no HIV Screening recommended: no  Advanced Directives: Written information was not prepared per patient's request.  Referrals & Orders Orders Placed This Encounter  Procedures   Ambulatory referral to Gastroenterology (for Colonoscopy)    Follow-up Plan Follow-up with Antonio Coombe, DO as planned Schedule influenza vaccine at the pharmacy or in office.  Complete lab appointment prior to the appointment with PCP.   Medicare wellness visit in one year.  Patient will access AVS on my chart.   I have personally reviewed and noted the following in the patient's chart:   Medical and social history Use of alcohol, tobacco or illicit drugs  Current medications and supplements Functional ability and status Nutritional status Physical activity Advanced directives List of other physicians Hospitalizations, surgeries, and ER visits in previous 12 months Vitals Screenings to include cognitive, depression, and falls Referrals and appointments  In addition, I have reviewed and discussed with Antonio Rivera certain preventive protocols, quality metrics, and best practice recommendations. A written personalized care plan for preventive services as well as general preventive health recommendations is available and can be mailed to the patient at his request.      Modesto Charon, RN BSN  06/19/2023

## 2023-06-21 DIAGNOSIS — I10 Essential (primary) hypertension: Secondary | ICD-10-CM | POA: Diagnosis not present

## 2023-06-21 DIAGNOSIS — E119 Type 2 diabetes mellitus without complications: Secondary | ICD-10-CM | POA: Diagnosis not present

## 2023-06-21 DIAGNOSIS — E785 Hyperlipidemia, unspecified: Secondary | ICD-10-CM | POA: Diagnosis not present

## 2023-06-22 LAB — CMP14+EGFR
ALT: 26 [IU]/L (ref 0–44)
AST: 22 [IU]/L (ref 0–40)
Albumin: 4.4 g/dL (ref 3.9–4.9)
Alkaline Phosphatase: 67 [IU]/L (ref 44–121)
BUN/Creatinine Ratio: 27 — ABNORMAL HIGH (ref 10–24)
BUN: 19 mg/dL (ref 8–27)
Bilirubin Total: 0.2 mg/dL (ref 0.0–1.2)
CO2: 20 mmol/L (ref 20–29)
Calcium: 9.3 mg/dL (ref 8.6–10.2)
Chloride: 100 mmol/L (ref 96–106)
Creatinine, Ser: 0.71 mg/dL — ABNORMAL LOW (ref 0.76–1.27)
Globulin, Total: 2.2 g/dL (ref 1.5–4.5)
Glucose: 134 mg/dL — ABNORMAL HIGH (ref 70–99)
Potassium: 4.6 mmol/L (ref 3.5–5.2)
Sodium: 136 mmol/L (ref 134–144)
Total Protein: 6.6 g/dL (ref 6.0–8.5)
eGFR: 101 mL/min/{1.73_m2} (ref >59)

## 2023-06-22 LAB — LIPID PANEL WITH LDL/HDL RATIO
Cholesterol, Total: 155 mg/dL (ref 100–199)
HDL: 57 mg/dL (ref >39)
LDL Chol Calc (NIH): 59 mg/dL (ref 0–99)
LDL/HDL Ratio: 1 {ratio} (ref 0.0–3.6)
Triglycerides: 250 mg/dL — ABNORMAL HIGH (ref 0–149)
VLDL Cholesterol Cal: 39 mg/dL (ref 5–40)

## 2023-06-22 LAB — CBC WITH DIFFERENTIAL/PLATELET
Basophils Absolute: 0.1 10*3/uL (ref 0.0–0.2)
Basos: 1 %
EOS (ABSOLUTE): 0.3 10*3/uL (ref 0.0–0.4)
Eos: 4 %
Hematocrit: 46.1 % (ref 37.5–51.0)
Hemoglobin: 15 g/dL (ref 13.0–17.7)
Immature Grans (Abs): 0 10*3/uL (ref 0.0–0.1)
Immature Granulocytes: 0 %
Lymphocytes Absolute: 2.4 10*3/uL (ref 0.7–3.1)
Lymphs: 32 %
MCH: 29.4 pg (ref 26.6–33.0)
MCHC: 32.5 g/dL (ref 31.5–35.7)
MCV: 90 fL (ref 79–97)
Monocytes Absolute: 0.5 10*3/uL (ref 0.1–0.9)
Monocytes: 7 %
Neutrophils Absolute: 4.1 10*3/uL (ref 1.4–7.0)
Neutrophils: 56 %
Platelets: 237 10*3/uL (ref 150–450)
RBC: 5.1 x10E6/uL (ref 4.14–5.80)
RDW: 14.3 % (ref 11.6–15.4)
WBC: 7.3 10*3/uL (ref 3.4–10.8)

## 2023-06-22 LAB — MICROALBUMIN / CREATININE URINE RATIO
Creatinine, Urine: 13 mg/dL
Microalb/Creat Ratio: <23 mg/g{creat} (ref 0–29)
Microalbumin, Urine: <3 ug/mL

## 2023-06-22 LAB — PSA: Prostate Specific Ag, Serum: 1 ng/mL (ref 0.0–4.0)

## 2023-06-25 ENCOUNTER — Ambulatory Visit (INDEPENDENT_AMBULATORY_CARE_PROVIDER_SITE_OTHER): Payer: Medicare Other | Admitting: Family Medicine

## 2023-06-25 ENCOUNTER — Encounter: Payer: Self-pay | Admitting: Family Medicine

## 2023-06-25 VITALS — BP 106/67 | HR 73 | Ht 71.0 in | Wt 240.0 lb

## 2023-06-25 DIAGNOSIS — E785 Hyperlipidemia, unspecified: Secondary | ICD-10-CM | POA: Diagnosis not present

## 2023-06-25 DIAGNOSIS — E1140 Type 2 diabetes mellitus with diabetic neuropathy, unspecified: Secondary | ICD-10-CM | POA: Diagnosis not present

## 2023-06-25 DIAGNOSIS — E119 Type 2 diabetes mellitus without complications: Secondary | ICD-10-CM

## 2023-06-25 DIAGNOSIS — Z7984 Long term (current) use of oral hypoglycemic drugs: Secondary | ICD-10-CM

## 2023-06-25 DIAGNOSIS — Z23 Encounter for immunization: Secondary | ICD-10-CM | POA: Diagnosis not present

## 2023-06-25 DIAGNOSIS — I10 Essential (primary) hypertension: Secondary | ICD-10-CM

## 2023-06-25 LAB — POCT GLYCOSYLATED HEMOGLOBIN (HGB A1C): HbA1c, POC (controlled diabetic range): 8 % — AB (ref 0.0–7.0)

## 2023-06-25 MED ORDER — FREESTYLE LIBRE 3 PLUS SENSOR MISC: 1 refills | Status: DC

## 2023-06-25 NOTE — Assessment & Plan Note (Signed)
Encouraged continued dietary changes.  He will continue CGM to monitor glucose.  Will plan to follow-up in 3 to 4 months.  If A1c is continues to increase we will discuss additional medication.

## 2023-06-25 NOTE — Assessment & Plan Note (Signed)
Patient blood pressure mains well-controlled.  He will continue lisinopril at current strength of 10 mg daily.

## 2023-06-25 NOTE — Assessment & Plan Note (Addendum)
Tolerating atorvastatin well.  Continue current strength.

## 2023-06-25 NOTE — Progress Notes (Signed)
Antonio Rivera - 67 y.o. male MRN [id number]  Date of birth: 1956-06-25  Subjective Chief Complaint  Patient presents with   Hypertension   Diabetes    HPI Antonio Rivera is a 67 y.o. male here today for follow up visit.   He reports that he is doing pretty well..   Continues on lisinopril for management of HTN.  BP is well controlled at this time.  No side effects at this time.  Denies chest pain, shortness of breath, palpitations, headaches or vision changes.  He has been working on dietary changes to help with blood sugar control.  A1c is up slightly since last time.  He was using CGM to help monitor his glucose as well.  Tolerating atorvastatin well for associated hyperlipidemia.  ROS:  A comprehensive ROS was completed and negative except as noted per HPI  Allergies  Allergen Reactions   Oysters [Shellfish Allergy] Nausea And Vomiting   Prednisone Anxiety    Past Medical History:  Diagnosis Date   Diabetes mellitus    type 2   HNP (herniated nucleus pulposus)    C6--7   Hyperlipidemia    Hypertension    OSA on CPAP 1999    Past Surgical History:  Procedure Laterality Date   ruptured disk surgery  1999   C6--7   TONSILLECTOMY  age 34    Social History   Socioeconomic History   Marital status: Significant Other    Spouse name: Roswell Miners   Number of children: 3   Years of education: 13   Highest education level: High school graduate  Occupational History   Occupation: Full-time Geneticist, molecular  Tobacco Use   Smoking status: Former    Types: Pipe    Quit date: 08/07/1989    Years since quitting: 33.9   Smokeless tobacco: Never  Vaping Use   Vaping status: Never Used  Substance and Sexual Activity   Alcohol use: Yes    Alcohol/week: 7.0 standard drinks of alcohol    Types: 7 Shots of liquor per week    Comment: per week   Drug use: No   Sexual activity: Not on file  Other Topics Concern   Not on file  Social History Narrative   Lives with  his significant other. He has three children. He is still working full time. He enjoys wood working.    Social Determinants of Health   Financial Resource Strain: Low Risk  (06/19/2023)   Overall Financial Resource Strain (CARDIA)    Difficulty of Paying Living Expenses: Not hard at all  Food Insecurity: No Food Insecurity (06/19/2023)   Hunger Vital Sign    Worried About Running Out of Food in the Last Year: Never true    Ran Out of Food in the Last Year: Never true  Transportation Needs: No Transportation Needs (06/19/2023)   PRAPARE - Administrator, Civil Service (Medical): No    Lack of Transportation (Non-Medical): No  Physical Activity: Sufficiently Active (06/19/2023)   Exercise Vital Sign    Days of Exercise per Week: 7 days    Minutes of Exercise per Session: 30 min  Stress: No Stress Concern Present (06/19/2023)   [employer] of Occupational Health - Occupational Stress Questionnaire    Feeling of Stress : Not at all  Social Connections: Moderately Integrated (06/19/2023)   Social Connection and Isolation Panel [NHANES]    Frequency of Communication with Friends and Family: More than three times a week  Frequency of Social Gatherings with Friends and Family: More than three times a week    Attends Religious Services: More than 4 times per year    Active Member of Clubs or Organizations: No    Attends Banker Meetings: Never    Marital Status: Living with partner    Family History  Problem Relation Age of Onset   Cancer Mother        skin   Cancer Father 70       prostate/died age 54    Cancer Sister        ovarian cancer   Cancer Brother        skin cancer    Health Maintenance  Topic Date Due   COVID-19 Vaccine (1 - 2023-24 season) 07/05/2023 (Originally 04/08/2023)   Colonoscopy  06/18/2024 (Originally 04/27/2023)   FOOT EXAM  10/30/2023   HEMOGLOBIN A1C  12/23/2023   OPHTHALMOLOGY EXAM  03/21/2024   Medicare Annual Wellness  (AWV)  06/18/2024   Diabetic kidney evaluation - eGFR measurement  06/20/2024   Diabetic kidney evaluation - Urine ACR  06/20/2024   DTaP/Tdap/Td (3 - Td or Tdap) 07/05/2024   Pneumonia Vaccine 48+ Years old  Completed   INFLUENZA VACCINE  Completed   Hepatitis C Screening  Completed   Zoster Vaccines- Shingrix  Completed   HPV VACCINES  Aged Out     ----------------------------------------------------------------------------------------------------------------------------------------------------------------------------------------------------------------- Physical Exam BP 106/67 (BP Location: Left Arm, Patient Position: Sitting, Cuff Size: Normal)   Pulse 73   Ht 5\' 11"  (1.803 m)   Wt 240 lb (108.9 kg)   SpO2 96%   BMI 33.47 kg/m   Physical Exam Constitutional:      Appearance: Normal appearance.  Cardiovascular:     Rate and Rhythm: Normal rate and regular rhythm.  Pulmonary:     Effort: Pulmonary effort is normal.     Breath sounds: Normal breath sounds.  Neurological:     General: No focal deficit present.     Mental Status: He is alert.  Psychiatric:        Mood and Affect: Mood normal.        Behavior: Behavior normal.     ------------------------------------------------------------------------------------------------------------------------------------------------------------------------------------------------------------------- Assessment and Plan  Essential hypertension, benign Patient blood pressure mains well-controlled.  He will continue lisinopril at current strength of 10 mg daily.  Hyperlipidemia LDL goal <100 Tolerating atorvastatin well.  Continue current strength.  Type 2 diabetes mellitus with diabetic neuropathy, unspecified (HCC) Encouraged continued dietary changes.  He will continue CGM to monitor glucose.  Will plan to follow-up in 3 to 4 months.  If A1c is continues to increase we will discuss additional medication.   Meds ordered this  encounter  Medications   Continuous Glucose Sensor (FREESTYLE LIBRE 3 PLUS SENSOR) MISC    Sig: Change sensor every 15 days.    Dispense:  4 each    Refill:  1    Return in about 3 months (around 09/25/2023) for Type 2 Diabetes.    This visit occurred during the SARS-CoV-2 public health emergency.  Safety protocols were in place, including screening questions prior to the visit, additional usage of staff PPE, and extensive cleaning of exam room while observing appropriate contact time as indicated for disinfecting solutions.

## 2023-07-26 DIAGNOSIS — D123 Benign neoplasm of transverse colon: Secondary | ICD-10-CM | POA: Diagnosis not present

## 2023-07-26 DIAGNOSIS — K621 Rectal polyp: Secondary | ICD-10-CM | POA: Diagnosis not present

## 2023-07-26 DIAGNOSIS — Z860101 Personal history of adenomatous and serrated colon polyps: Secondary | ICD-10-CM | POA: Diagnosis not present

## 2023-07-26 DIAGNOSIS — K635 Polyp of colon: Secondary | ICD-10-CM | POA: Diagnosis not present

## 2023-07-26 DIAGNOSIS — Z09 Encounter for follow-up examination after completed treatment for conditions other than malignant neoplasm: Secondary | ICD-10-CM | POA: Diagnosis not present

## 2023-08-13 ENCOUNTER — Telehealth: Payer: Self-pay

## 2023-08-13 DIAGNOSIS — E119 Type 2 diabetes mellitus without complications: Secondary | ICD-10-CM

## 2023-08-13 NOTE — Telephone Encounter (Signed)
 Patient came into office to update insurance card, and to get his Empagliflozin-metFORMIN HCl (SYNJARDY) 12.12-998 MG TABS sent to pharmacy, please advise, thanks.

## 2023-08-15 MED ORDER — SYNJARDY 12.5-1000 MG PO TABS: 1.0000 | ORAL_TABLET | Freq: Two times a day (BID) | ORAL | 1 refills | Status: DC

## 2023-08-15 NOTE — Telephone Encounter (Signed)
 Refill sent.

## 2023-08-16 ENCOUNTER — Other Ambulatory Visit: Payer: Self-pay | Admitting: Family Medicine

## 2023-08-16 DIAGNOSIS — E119 Type 2 diabetes mellitus without complications: Secondary | ICD-10-CM

## 2023-08-20 NOTE — Telephone Encounter (Signed)
 Copied from CRM [id number]. Topic: Clinical - Prescription Issue >> Aug 17, 2023  4:22 PM Farrel B wrote: Reason for CRM: Empagliflozin -metFORMIN  HCl (SYNJARDY ) 12.12-998 MG TABS Patient states he is [facility] at this present time he is needing his medication his sugar has been over 300 for the past 3 days when he began reaching out to get his prescription filled however, [facility] not [facility] request for [facility]ill as of right now. Patient states he will sit there until someone calls him back because he needs his medication. Double checked to make sure that [facility] was not his primary pharmacy and edited that he stated he could not check at [facility] because the weather caused a shut down of the store.  Please call patient back t [phone]. I offered him the opportunity to speak with a triage nurse he stated it wasn't going to do any good he needed his medications

## 2023-09-09 ENCOUNTER — Other Ambulatory Visit: Payer: Self-pay | Admitting: Family Medicine

## 2023-09-09 DIAGNOSIS — I152 Hypertension secondary to endocrine disorders: Secondary | ICD-10-CM

## 2023-09-25 ENCOUNTER — Ambulatory Visit: Payer: No Typology Code available for payment source | Admitting: Family Medicine

## 2023-10-01 ENCOUNTER — Encounter: Payer: Self-pay | Admitting: Family Medicine

## 2023-10-01 ENCOUNTER — Ambulatory Visit (INDEPENDENT_AMBULATORY_CARE_PROVIDER_SITE_OTHER): Payer: No Typology Code available for payment source | Admitting: Family Medicine

## 2023-10-01 VITALS — BP 119/70 | HR 74 | Ht 71.0 in | Wt 243.0 lb

## 2023-10-01 DIAGNOSIS — I10 Essential (primary) hypertension: Secondary | ICD-10-CM

## 2023-10-01 DIAGNOSIS — E119 Type 2 diabetes mellitus without complications: Secondary | ICD-10-CM | POA: Diagnosis not present

## 2023-10-01 DIAGNOSIS — E785 Hyperlipidemia, unspecified: Secondary | ICD-10-CM

## 2023-10-01 DIAGNOSIS — E1140 Type 2 diabetes mellitus with diabetic neuropathy, unspecified: Secondary | ICD-10-CM

## 2023-10-01 DIAGNOSIS — Z7984 Long term (current) use of oral hypoglycemic drugs: Secondary | ICD-10-CM

## 2023-10-01 LAB — POCT GLYCOSYLATED HEMOGLOBIN (HGB A1C): HbA1c, POC (controlled diabetic range): 8.4 % — AB (ref 0.0–7.0)

## 2023-10-01 MED ORDER — RYBELSUS 7 MG PO TABS: 1.0000 | ORAL_TABLET | Freq: Every day | ORAL | 3 refills | Status: DC

## 2023-10-01 NOTE — Assessment & Plan Note (Signed)
 BP remains well controlled. He will continue lisinopril at current strength of 10 mg daily.

## 2023-10-01 NOTE — Assessment & Plan Note (Signed)
 Diabetes is not well controlled at this time.  Will continue Synjardy with dietary changes.  Additionally, I am adding Rybelsus.  Initially 3mg  daily with titration to 7mg  after 30 days.

## 2023-10-01 NOTE — Assessment & Plan Note (Signed)
Tolerating atorvastatin well.  Continue current strength.

## 2023-10-01 NOTE — Progress Notes (Signed)
 Antonio Rivera - 68 y.o. male MRN [id number]  Date of birth: 07-16-1956  Subjective Chief Complaint  Patient presents with   Medical Management of Chronic Issues    HPI Antonio Rivera is a 68 y.o. male here today for follow up visit.  He reports that he is feeling well. Marland Kitchen   He continues on synjardy for management of diabetes.  He is tolerating well. Unfortunately, his A1c has continued to increase. Now at 8.4%.  He has been resistant to adding additional medication.    BP remains pretty well controlled with lisinopril.  Denies side effects including chest pain, shortness of breath, palpitations, headache or vision changes.   ROS:  A comprehensive ROS was completed and negative except as noted per HPI    Allergies  Allergen Reactions   Oysters [Shellfish Allergy] Nausea And Vomiting   Prednisone Anxiety    Past Medical History:  Diagnosis Date   Diabetes mellitus    type 2   HNP (herniated nucleus pulposus)    C6--7   Hyperlipidemia    Hypertension    OSA on CPAP 1999    Past Surgical History:  Procedure Laterality Date   ruptured disk surgery  1999   C6--7   TONSILLECTOMY  age 56    Social History   Socioeconomic History   Marital status: Significant Other    Spouse name: Roswell Miners   Number of children: 3   Years of education: 13   Highest education level: High school graduate  Occupational History   Occupation: Full-time Geneticist, molecular  Tobacco Use   Smoking status: Former    Types: Pipe    Quit date: 08/07/1989    Years since quitting: 34.1   Smokeless tobacco: Never  Vaping Use   Vaping status: Never Used  Substance and Sexual Activity   Alcohol use: Yes    Alcohol/week: 7.0 standard drinks of alcohol    Types: 7 Shots of liquor per week    Comment: per week   Drug use: No   Sexual activity: Not on file  Other Topics Concern   Not on file  Social History Narrative   Lives with his significant other. He has three children. He is still  working full time. He enjoys wood working.    Social Drivers of Corporate investment banker Strain: Low Risk  (06/19/2023)   Overall Financial Resource Strain (CARDIA)    Difficulty of Paying Living Expenses: Not hard at all  Food Insecurity: No Food Insecurity (06/19/2023)   Hunger Vital Sign    Worried About Running Out of Food in the Last Year: Never true    Ran Out of Food in the Last Year: Never true  Transportation Needs: No Transportation Needs (06/19/2023)   PRAPARE - Administrator, Civil Service (Medical): No    Lack of Transportation (Non-Medical): No  Physical Activity: Sufficiently Active (06/19/2023)   Exercise Vital Sign    Days of Exercise per Week: 7 days    Minutes of Exercise per Session: 30 min  Stress: No Stress Concern Present (06/19/2023)   [employer] of Occupational Health - Occupational Stress Questionnaire    Feeling of Stress : Not at all  Social Connections: Moderately Integrated (06/19/2023)   Social Connection and Isolation Panel [NHANES]    Frequency of Communication with Friends and Family: More than three times a week    Frequency of Social Gatherings with Friends and Family: More than three times a week  Attends Religious Services: More than 4 times per year    Active Member of Clubs or Organizations: No    Attends Banker Meetings: Never    Marital Status: Living with partner    Family History  Problem Relation Age of Onset   Cancer Mother        skin   Cancer Father 21       prostate/died age 81    Cancer Sister        ovarian cancer   Cancer Brother        skin cancer    Health Maintenance  Topic Date Due   COVID-19 Vaccine (1 - 2024-25 season) Never done   FOOT EXAM  10/30/2023   OPHTHALMOLOGY EXAM  03/21/2024   HEMOGLOBIN A1C  03/30/2024   Medicare Annual Wellness (AWV)  06/18/2024   Diabetic kidney evaluation - eGFR measurement  06/20/2024   Diabetic kidney evaluation - Urine ACR  06/20/2024    DTaP/Tdap/Td (3 - Td or Tdap) 07/05/2024   Colonoscopy  07/25/2028   Pneumonia Vaccine 72+ Years old  Completed   INFLUENZA VACCINE  Completed   Hepatitis C Screening  Completed   Zoster Vaccines- Shingrix  Completed   HPV VACCINES  Aged Out     ----------------------------------------------------------------------------------------------------------------------------------------------------------------------------------------------------------------- Physical Exam BP 119/70 (BP Location: Left Arm, Patient Position: Sitting, Cuff Size: Large)   Pulse 74   Ht 5\' 11"  (1.803 m)   Wt 243 lb (110.2 kg)   SpO2 97%   BMI 33.89 kg/m   Physical Exam Constitutional:      Appearance: Normal appearance.  HENT:     Head: Normocephalic and atraumatic.  Cardiovascular:     Rate and Rhythm: Normal rate and regular rhythm.  Pulmonary:     Effort: Pulmonary effort is normal.     Breath sounds: Normal breath sounds.  Neurological:     Mental Status: He is alert.  Psychiatric:        Mood and Affect: Mood normal.        Behavior: Behavior normal.     ------------------------------------------------------------------------------------------------------------------------------------------------------------------------------------------------------------------- Assessment and Plan  Type 2 diabetes mellitus with diabetic neuropathy, unspecified (HCC) Diabetes is not well controlled at this time.  Will continue Synjardy with dietary changes.  Additionally, I am adding Rybelsus.  Initially 3mg  daily with titration to 7mg  after 30 days.   Hyperlipidemia LDL goal <100 Tolerating atorvastatin well.  Continue current strength.  Essential hypertension, benign BP remains well controlled. He will continue lisinopril at current strength of 10 mg daily.   Meds ordered this encounter  Medications   Semaglutide (RYBELSUS) 7 MG TABS    Sig: Take 1 tablet (7 mg total) by mouth daily in the  afternoon. Start after completion of 3mg  30 day sample    Dispense:  30 tablet    Refill:  3    No follow-ups on file.    This visit occurred during the SARS-CoV-2 public health emergency.  Safety protocols were in place, including screening questions prior to the visit, additional usage of staff PPE, and extensive cleaning of exam room while observing appropriate contact time as indicated for disinfecting solutions.

## 2023-10-30 ENCOUNTER — Telehealth: Payer: Self-pay

## 2023-10-30 MED ORDER — RYBELSUS 7 MG PO TABS: 1.0000 | ORAL_TABLET | Freq: Every day | ORAL | 2 refills | Status: DC

## 2023-10-30 NOTE — Telephone Encounter (Signed)
 Task completed. Per patient's request - Rybelsus rx sent to [facility] pharmacy. Patient has been updated.

## 2023-10-30 NOTE — Telephone Encounter (Signed)
 Copied from CRM [id number]. Topic: Clinical - Prescription Issue >> Oct 29, 2023  4:34 PM Fuller Mandril wrote: Reason for CRM: Patient called and states last Rx that was sent for Semaglutide (RYBELSUS) 7 MG TABS [facility] and [facility] needs [facility] sent to [facility]. Thank You

## 2023-11-29 ENCOUNTER — Other Ambulatory Visit: Payer: Self-pay | Admitting: Family Medicine

## 2023-11-29 DIAGNOSIS — E119 Type 2 diabetes mellitus without complications: Secondary | ICD-10-CM

## 2024-01-29 ENCOUNTER — Encounter: Payer: Self-pay | Admitting: Family Medicine

## 2024-01-29 ENCOUNTER — Ambulatory Visit (INDEPENDENT_AMBULATORY_CARE_PROVIDER_SITE_OTHER): Payer: No Typology Code available for payment source | Admitting: Family Medicine

## 2024-01-29 VITALS — BP 108/63 | HR 63 | Ht 71.0 in | Wt 240.0 lb

## 2024-01-29 DIAGNOSIS — E119 Type 2 diabetes mellitus without complications: Secondary | ICD-10-CM | POA: Diagnosis not present

## 2024-01-29 DIAGNOSIS — E785 Hyperlipidemia, unspecified: Secondary | ICD-10-CM | POA: Diagnosis not present

## 2024-01-29 DIAGNOSIS — R972 Elevated prostate specific antigen [PSA]: Secondary | ICD-10-CM | POA: Diagnosis not present

## 2024-01-29 DIAGNOSIS — E1140 Type 2 diabetes mellitus with diabetic neuropathy, unspecified: Secondary | ICD-10-CM

## 2024-01-29 DIAGNOSIS — I10 Essential (primary) hypertension: Secondary | ICD-10-CM | POA: Diagnosis not present

## 2024-01-29 DIAGNOSIS — Z7984 Long term (current) use of oral hypoglycemic drugs: Secondary | ICD-10-CM

## 2024-01-29 LAB — POCT GLYCOSYLATED HEMOGLOBIN (HGB A1C): HbA1c, POC (controlled diabetic range): 7.9 % — AB (ref 0.0–7.0)

## 2024-01-29 MED ORDER — FREESTYLE LIBRE 3 PLUS SENSOR MISC: 3 refills | Status: AC

## 2024-01-29 NOTE — Assessment & Plan Note (Signed)
Tolerating atorvastatin well.  Continue current strength.

## 2024-01-29 NOTE — Progress Notes (Signed)
 Antonio Rivera - 68 y.o. male MRN [id number]  Date of birth: 10-Sep-1955  Subjective No chief complaint on file.   HPI Antonio Rivera is a 68 y.o. male here today for follow-up visit.  Reports he is doing pretty well.  He did discontinue Rybelsus .  He has been making dietary changes.  A1c is a little better today compared to last time.  He is continuing on Synjardy .  No side effects at this time.  He is monitoring glucose with freestyle libre.  Continues on atorvastatin  for management of hyperlipidemia.  Tolerating well.   Blood pressure well-controlled at current strength.  No chest pain, shortness of breath, , headaches or vision changes.  ROS:  A comprehensive ROS was completed and negative except as noted per HPI  Lab Results  Component Value Date   LDLCALC 59 06/21/2023      Allergies  Allergen Reactions   Corticosteroids    Oysters [Shellfish Allergy] Nausea And Vomiting   Prednisone Anxiety    Past Medical History:  Diagnosis Date   Diabetes mellitus    type 2   HNP (herniated nucleus pulposus)    C6--7   Hyperlipidemia    Hypertension    OSA on CPAP 1999    Past Surgical History:  Procedure Laterality Date   ruptured disk surgery  1999   C6--7   TONSILLECTOMY  age 51    Social History   Socioeconomic History   Marital status: Significant Other    Spouse name: Erminio Pauls   Number of children: 3   Years of education: 13   Highest education level: High school graduate  Occupational History   Occupation: Full-time Geneticist, molecular  Tobacco Use   Smoking status: Former    Types: Pipe    Quit date: 08/07/1989    Years since quitting: 34.5   Smokeless tobacco: Never  Vaping Use   Vaping status: Never Used  Substance and Sexual Activity   Alcohol use: Yes    Alcohol/week: 7.0 standard drinks of alcohol    Types: 7 Shots of liquor per week    Comment: per week   Drug use: No   Sexual activity: Not on file  Other Topics Concern   Not on file   Social History Narrative   Lives with his significant other. He has three children. He is still working full time. He enjoys wood working.    Social Drivers of Corporate investment banker Strain: Low Risk  (06/19/2023)   Overall Financial Resource Strain (CARDIA)    Difficulty of Paying Living Expenses: Not hard at all  Food Insecurity: No Food Insecurity (06/19/2023)   Hunger Vital Sign    Worried About Running Out of Food in the Last Year: Never true    Ran Out of Food in the Last Year: Never true  Transportation Needs: No Transportation Needs (06/19/2023)   PRAPARE - Administrator, Civil Service (Medical): No    Lack of Transportation (Non-Medical): No  Physical Activity: Sufficiently Active (06/19/2023)   Exercise Vital Sign    Days of Exercise per Week: 7 days    Minutes of Exercise per Session: 30 min  Stress: No Stress Concern Present (06/19/2023)   [employer] of Occupational Health - Occupational Stress Questionnaire    Feeling of Stress : Not at all  Social Connections: Moderately Integrated (06/19/2023)   Social Connection and Isolation Panel    Frequency of Communication with Friends and Family: More than three  times a week    Frequency of Social Gatherings with Friends and Family: More than three times a week    Attends Religious Services: More than 4 times per year    Active Member of Clubs or Organizations: No    Attends Banker Meetings: Never    Marital Status: Living with partner    Family History  Problem Relation Age of Onset   Cancer Mother        skin   Cancer Father 38       prostate/died age 48    Cancer Sister        ovarian cancer   Cancer Brother        skin cancer    Health Maintenance  Topic Date Due   COVID-19 Vaccine (1 - 2024-25 season) 02/14/2024 (Originally 04/08/2023)   INFLUENZA VACCINE  03/07/2024   OPHTHALMOLOGY EXAM  03/21/2024   Medicare Annual Wellness (AWV)  06/18/2024   Diabetic kidney  evaluation - eGFR measurement  06/20/2024   Diabetic kidney evaluation - Urine ACR  06/20/2024   DTaP/Tdap/Td (3 - Td or Tdap) 07/05/2024   HEMOGLOBIN A1C  07/30/2024   FOOT EXAM  01/28/2025   Colonoscopy  07/25/2028   Pneumococcal Vaccine: 50+ Years  Completed   Hepatitis C Screening  Completed   Zoster Vaccines- Shingrix   Completed   Hepatitis B Vaccines  Aged Out   HPV VACCINES  Aged Out   Meningococcal B Vaccine  Aged Out     ----------------------------------------------------------------------------------------------------------------------------------------------------------------------------------------------------------------- Physical Exam BP 108/63 (BP Location: Left Arm, Patient Position: Sitting, Cuff Size: Normal)   Pulse 63   Ht 5' 11 (1.803 m)   Wt 240 lb (108.9 kg)   SpO2 97%   BMI 33.47 kg/m   Physical Exam Constitutional:      Appearance: Normal appearance.   Eyes:     General: No scleral icterus.   Cardiovascular:     Rate and Rhythm: Normal rate and regular rhythm.  Pulmonary:     Effort: Pulmonary effort is normal.     Breath sounds: Normal breath sounds.   Neurological:     Mental Status: He is alert.   Psychiatric:        Mood and Affect: Mood normal.     ------------------------------------------------------------------------------------------------------------------------------------------------------------------------------------------------------------------- Assessment and Plan  Type 2 diabetes mellitus with diabetic neuropathy, unspecified (HCC) A1c is a little better but not optimally controlled.  Continue work on dietary changes.  Continue Synjardy  at current strength.  Hyperlipidemia LDL goal <100 Tolerating atorvastatin  well.  Continue current strength.  Essential hypertension, benign BP remains well controlled. He will continue lisinopril  at current strength of 10 mg daily.   Meds ordered this encounter  Medications    Continuous Glucose Sensor (FREESTYLE LIBRE 3 PLUS SENSOR) MISC    Sig: Change sensor every 15 days.    Dispense:  6 each    Refill:  3    Return in about 19 weeks (around 06/10/2024) for Type 2 Diabetes, Hypertension.

## 2024-01-29 NOTE — Assessment & Plan Note (Signed)
 A1c is a little better but not optimally controlled.  Continue work on dietary changes.  Continue Synjardy  at current strength.

## 2024-01-29 NOTE — Assessment & Plan Note (Signed)
 BP remains well controlled. He will continue lisinopril at current strength of 10 mg daily.

## 2024-02-22 ENCOUNTER — Other Ambulatory Visit: Payer: Self-pay | Admitting: Family Medicine

## 2024-02-22 DIAGNOSIS — E119 Type 2 diabetes mellitus without complications: Secondary | ICD-10-CM

## 2024-03-12 ENCOUNTER — Other Ambulatory Visit: Payer: Self-pay | Admitting: Family Medicine

## 2024-03-12 DIAGNOSIS — E785 Hyperlipidemia, unspecified: Secondary | ICD-10-CM

## 2024-03-12 DIAGNOSIS — M51369 Other intervertebral disc degeneration, lumbar region without mention of lumbar back pain or lower extremity pain: Secondary | ICD-10-CM

## 2024-03-12 DIAGNOSIS — I152 Hypertension secondary to endocrine disorders: Secondary | ICD-10-CM

## 2024-03-12 DIAGNOSIS — E119 Type 2 diabetes mellitus without complications: Secondary | ICD-10-CM

## 2024-03-12 NOTE — Telephone Encounter (Unsigned)
 Copied from CRM [id number]. Topic: Clinical - Medication Refill >> Mar 12, 2024 12:18 PM Miquel SAILOR wrote: Medication: acetaminophen  (TYLENOL ) 500 MG tablet albuterol  (VENTOLIN  HFA) 108 (90 Base) MCG/ACT inhaler AMBULATORY NON FORMULARY MEDICATION aspirin  81 MG EC tablet atorvastatin  (LIPITOR) 20 MG tablet cetirizine  (ZYRTEC ) 10 MG tablet Continuous Glucose Sensor (FREESTYLE LIBRE 3 PLUS SENSOR) MISC Empagliflozin -metFORMIN  HCl (SYNJARDY ) 12.12-998 MG TABS famotidine  (PEPCID ) 20 MG tablet Fish Oil OIL gabapentin  (NEURONTIN ) 800 MG tablet lisinopril  (ZESTRIL ) 10 MG tablet Multiple Vitamins-Minerals (MULTIVITAMIN PO) Nystatin  POWD Red Yeast Rice 600 MG CAPS terbinafine (LAMISIL) 250 MG tablet triamcinolone cream (KENALOG) 0.1 %    Has the patient contacted their pharmacy? Yes (Agent: If no, request that the patient contact the pharmacy for the refill. If patient does not wish to contact the pharmacy document the reason why and proceed with request.) (Agent: If yes, when and what did the pharmacy advise?)  This is the patient's preferred pharmacy:  [facility] [address] [place], [place] - [address] [facility] [address] [place] [address] Phone: [phone] Fax: [phone]  Is this the correct pharmacy for this prescription? Yes If no, delete pharmacy and type the correct one.   Has the prescription been filled recently? Yes  Is the patient out of the medication? Yes  Has the patient been seen for an appointment in the last year OR does the patient have an upcoming appointment? Yes  Can we respond through MyChart? Yes  Agent: Please be advised that Rx refills may take up to 3 business days. We ask that you follow-up with your pharmacy.

## 2024-03-14 MED ORDER — LISINOPRIL 10 MG PO TABS: 10.0000 mg | ORAL_TABLET | Freq: Every day | ORAL | 0 refills | Status: DC

## 2024-03-14 MED ORDER — FAMOTIDINE 20 MG PO TABS: 40.0000 mg | ORAL_TABLET | Freq: Every day | ORAL | 0 refills | Status: AC

## 2024-03-14 MED ORDER — ATORVASTATIN CALCIUM 20 MG PO TABS: 20.0000 mg | ORAL_TABLET | Freq: Every day | ORAL | 0 refills | Status: DC

## 2024-03-14 MED ORDER — GABAPENTIN 800 MG PO TABS: ORAL_TABLET | ORAL | 0 refills | Status: AC

## 2024-04-08 ENCOUNTER — Encounter: Payer: Self-pay | Admitting: Sports Medicine

## 2024-05-20 DIAGNOSIS — E669 Obesity, unspecified: Secondary | ICD-10-CM | POA: Diagnosis not present

## 2024-05-20 DIAGNOSIS — Z008 Encounter for other general examination: Secondary | ICD-10-CM | POA: Diagnosis not present

## 2024-05-20 DIAGNOSIS — Z6830 Body mass index (BMI) 30.0-30.9, adult: Secondary | ICD-10-CM | POA: Diagnosis not present

## 2024-06-05 NOTE — Progress Notes (Signed)
 Antonio Rivera                                          MRN: [id number]   06/05/2024   The [facility] Quality Team Specialist reviewed this patient medical record for the purposes of chart review for care gap closure. The following were reviewed: chart review for care gap closure-kidney health evaluation for diabetes:eGFR  and uACR.    [facility] Quality Team

## 2024-06-11 ENCOUNTER — Telehealth: Payer: Self-pay | Admitting: Family Medicine

## 2024-06-11 DIAGNOSIS — H9193 Unspecified hearing loss, bilateral: Secondary | ICD-10-CM

## 2024-06-11 NOTE — Telephone Encounter (Signed)
 Copied from CRM #[id number]. Topic: Referral - Request for Referral >> Jun 11, 2024  9:25 AM Jasmin G wrote: Did the patient discuss referral with their provider in the last year? Yes (If No - schedule appointment) (If Yes - send message)  Appointment offered? No  Type of order/referral and detailed reason for visit: Hearing test  Preference of office, provider, location: [facility], [phone]  If referral order, have you been seen by this specialty before? Yes (If Yes, this issue or another issue? When? Where?  Can we respond through MyChart? No

## 2024-06-16 ENCOUNTER — Other Ambulatory Visit: Payer: Self-pay | Admitting: Family Medicine

## 2024-06-16 DIAGNOSIS — I152 Hypertension secondary to endocrine disorders: Secondary | ICD-10-CM

## 2024-06-20 ENCOUNTER — Other Ambulatory Visit: Payer: Self-pay | Admitting: Family Medicine

## 2024-06-20 DIAGNOSIS — E785 Hyperlipidemia, unspecified: Secondary | ICD-10-CM

## 2024-06-20 NOTE — Telephone Encounter (Signed)
 Copied from CRM [id number]. Topic: Clinical - Medication Refill >> Jun 20, 2024  1:37 PM Avram G wrote: Medication: atorvastatin  (LIPITOR) 20 MG tablet 90 day supply  Has the patient contacted their pharmacy? Yes (Agent: If no, request that the patient contact the pharmacy for the refill. If patient does not wish to contact the pharmacy document the reason why and proceed with request.) (Agent: If yes, when and what did the pharmacy advise?)  This is the patient's preferred pharmacy:  [facility] # [address] [place], [place] - [address] [facility] [address] [place] [address] Phone: [phone] Fax: [phone]  Is this the correct pharmacy for this prescription? Yes If no, delete pharmacy and type the correct one.   Has the prescription been filled recently? No  Is the patient out of the medication? Yes  Has the patient been seen for an appointment in the last year OR does the patient have an upcoming appointment? Yes  Can we respond through MyChart? Yes  Agent: Please be advised that Rx refills may take up to 3 business days. We ask that you follow-up with your pharmacy.

## 2024-06-23 MED ORDER — ATORVASTATIN CALCIUM 20 MG PO TABS: 20.0000 mg | ORAL_TABLET | Freq: Every day | ORAL | 0 refills | Status: DC

## 2024-06-26 ENCOUNTER — Encounter

## 2024-07-01 ENCOUNTER — Ambulatory Visit: Admitting: Family Medicine

## 2024-07-14 ENCOUNTER — Ambulatory Visit: Admitting: Family Medicine

## 2024-07-14 ENCOUNTER — Encounter: Payer: Self-pay | Admitting: Family Medicine

## 2024-07-14 VITALS — BP 134/66 | HR 72 | Ht 71.0 in | Wt 249.0 lb

## 2024-07-14 DIAGNOSIS — Z23 Encounter for immunization: Secondary | ICD-10-CM

## 2024-07-14 DIAGNOSIS — I152 Hypertension secondary to endocrine disorders: Secondary | ICD-10-CM

## 2024-07-14 DIAGNOSIS — I10 Essential (primary) hypertension: Secondary | ICD-10-CM

## 2024-07-14 DIAGNOSIS — E119 Type 2 diabetes mellitus without complications: Secondary | ICD-10-CM

## 2024-07-14 DIAGNOSIS — E785 Hyperlipidemia, unspecified: Secondary | ICD-10-CM

## 2024-07-14 DIAGNOSIS — E1140 Type 2 diabetes mellitus with diabetic neuropathy, unspecified: Secondary | ICD-10-CM

## 2024-07-14 DIAGNOSIS — Z125 Encounter for screening for malignant neoplasm of prostate: Secondary | ICD-10-CM

## 2024-07-14 LAB — POCT GLYCOSYLATED HEMOGLOBIN (HGB A1C): HbA1c, POC (controlled diabetic range): 8.8 % — AB (ref 0.0–7.0)

## 2024-07-14 MED ORDER — LISINOPRIL 10 MG PO TABS: 10.0000 mg | ORAL_TABLET | Freq: Every day | ORAL | 2 refills | Status: AC

## 2024-07-14 MED ORDER — CETIRIZINE HCL 10 MG PO TABS: 10.0000 mg | ORAL_TABLET | Freq: Every day | ORAL | 2 refills | Status: AC

## 2024-07-14 NOTE — Assessment & Plan Note (Signed)
 Intolerant to atorvastatin .  Discussed change to crestor which he would ike to hold off on for now.  Recheck lipids.

## 2024-07-14 NOTE — Patient Instructions (Signed)

## 2024-07-14 NOTE — Assessment & Plan Note (Signed)
 Diabetes is not well controlled.  Does not want to add additional medication at this time.  Continue work on dietary changes.  Continue Synjardy  at current strength.

## 2024-07-14 NOTE — Progress Notes (Signed)
 Antonio Rivera - 68 y.o. male MRN [id number]  Date of birth: 05/27/1956  Subjective Chief Complaint  Patient presents with   Hypertension   Diabetes    HPI Antonio Rivera is a 68 y.o. male here today for follow up visit.   He reports that he is doing pretty well.   Continues on Synjardy  and is tolerating this well.  A1c today is increased at 8.8%.  Admits that he hasn't been doing very well with diet.  Refused ozempic  and other injectables in the past due to preservative of propylene glycol in this.  He denies new symptoms at this time.   He has stopped atorvastatin  due to having some cramps while taking this.  He is unsure about trying another statin.   HTN remains well controlled with lisinopril .  Denies chest pain, shortness of breath, palpitations, headache or vision changes.    ROS:  A comprehensive ROS was completed and negative except as noted per HPI  Allergies  Allergen Reactions   Corticosteroids    Oysters [Shellfish Allergy] Nausea And Vomiting   Prednisone Anxiety    Past Medical History:  Diagnosis Date   Diabetes mellitus    type 2   HNP (herniated nucleus pulposus)    C6--7   Hyperlipidemia    Hypertension    OSA on CPAP 1999    Past Surgical History:  Procedure Laterality Date   ruptured disk surgery  1999   C6--7   TONSILLECTOMY  age 75    Social History   Socioeconomic History   Marital status: Significant Other    Spouse name: Erminio Pauls   Number of children: 3   Years of education: 13   Highest education level: High school graduate  Occupational History   Occupation: Full-time Geneticist, Molecular  Tobacco Use   Smoking status: Former    Types: Pipe    Quit date: 08/07/1989    Years since quitting: 34.9   Smokeless tobacco: Never  Vaping Use   Vaping status: Never Used  Substance and Sexual Activity   Alcohol use: Yes    Alcohol/week: 7.0 standard drinks of alcohol    Types: 7 Shots of liquor per week    Comment: per week   Drug  use: No   Sexual activity: Not on file  Other Topics Concern   Not on file  Social History Narrative   Lives with his significant other. He has three children. He is still working full time. He enjoys wood working.    Social Drivers of Corporate Investment Banker Strain: Low Risk  (06/19/2023)   Overall Financial Resource Strain (CARDIA)    Difficulty of Paying Living Expenses: Not hard at all  Food Insecurity: No Food Insecurity (06/19/2023)   Hunger Vital Sign    Worried About Running Out of Food in the Last Year: Never true    Ran Out of Food in the Last Year: Never true  Transportation Needs: No Transportation Needs (06/19/2023)   PRAPARE - Administrator, Civil Service (Medical): No    Lack of Transportation (Non-Medical): No  Physical Activity: Unknown (07/14/2024)   Exercise Vital Sign    Days of Exercise per Week: 6 days    Minutes of Exercise per Session: Not on file  Stress: No Stress Concern Present (07/14/2024)   [employer] of Occupational Health - Occupational Stress Questionnaire    Feeling of Stress: Not at all  Social Connections: Unknown (07/14/2024)   Social Connection  and Isolation Panel    Frequency of Communication with Friends and Family: Three times a week    Frequency of Social Gatherings with Friends and Family: Once a week    Attends Religious Services: Not on Marketing Executive or Organizations: No    Attends Engineer, Structural: Not on file    Marital Status: Not on file    Family History  Problem Relation Age of Onset   Cancer Mother        skin   Cancer Father 55       prostate/died age 73    Cancer Sister        ovarian cancer   Cancer Brother        skin cancer    Health Maintenance  Topic Date Due   OPHTHALMOLOGY EXAM  03/21/2024   Medicare Annual Wellness (AWV)  06/18/2024   Diabetic kidney evaluation - eGFR measurement  06/20/2024   Diabetic kidney evaluation - Urine ACR  06/20/2024    DTaP/Tdap/Td (3 - Td or Tdap) 07/05/2024   COVID-19 Vaccine (1 - 2025-26 season) 07/30/2024 (Originally 04/07/2024)   HEMOGLOBIN A1C  01/12/2025   FOOT EXAM  01/28/2025   Colonoscopy  07/25/2028   Pneumococcal Vaccine: 50+ Years  Completed   Influenza Vaccine  Completed   Hepatitis C Screening  Completed   Zoster Vaccines- Shingrix   Completed   Meningococcal B Vaccine  Aged Out     ----------------------------------------------------------------------------------------------------------------------------------------------------------------------------------------------------------------- Physical Exam BP 134/66 (BP Location: Left Arm, Patient Position: Sitting, Cuff Size: Normal)   Pulse 72   Ht 5' 11 (1.803 m)   Wt 249 lb (112.9 kg)   SpO2 98%   BMI 34.73 kg/m   Physical Exam Constitutional:      Appearance: Normal appearance.  Cardiovascular:     Rate and Rhythm: Normal rate and regular rhythm.  Pulmonary:     Effort: Pulmonary effort is normal.     Breath sounds: Normal breath sounds.  Neurological:     General: No focal deficit present.     Mental Status: He is alert.  Psychiatric:        Mood and Affect: Mood normal.        Behavior: Behavior normal.     ------------------------------------------------------------------------------------------------------------------------------------------------------------------------------------------------------------------- Assessment and Plan  Essential hypertension, benign BP remains well controlled. He will continue lisinopril  at current strength of 10 mg daily.  Type 2 diabetes mellitus with diabetic neuropathy, unspecified (HCC) Diabetes is not well controlled.  Does not want to add additional medication at this time.  Continue work on dietary changes.  Continue Synjardy  at current strength.  Hyperlipidemia LDL goal <100 Intolerant to atorvastatin .  Discussed change to crestor which he would ike to hold off on for now.   Recheck lipids.    Meds ordered this encounter  Medications   cetirizine  (ZYRTEC ) 10 MG tablet    Sig: Take 1 tablet (10 mg total) by mouth daily.    Dispense:  90 tablet    Refill:  2   lisinopril  (ZESTRIL ) 10 MG tablet    Sig: Take 1 tablet (10 mg total) by mouth daily.    Dispense:  90 tablet    Refill:  2    Return in about 3 months (around 10/12/2024) for Type 2 Diabetes.

## 2024-07-14 NOTE — Assessment & Plan Note (Signed)
 BP remains well controlled. He will continue lisinopril at current strength of 10 mg daily.

## 2024-07-17 ENCOUNTER — Ambulatory Visit

## 2024-08-30 ENCOUNTER — Other Ambulatory Visit: Payer: Self-pay | Admitting: Family Medicine

## 2024-08-30 DIAGNOSIS — E119 Type 2 diabetes mellitus without complications: Secondary | ICD-10-CM

## 2024-09-10 NOTE — Progress Notes (Signed)
 Antonio Rivera                                          MRN: [id number]   09/10/2024   The [facility] Quality Team Specialist reviewed this patient medical record for the purposes of chart review for care gap closure. The following were reviewed: chart review for care gap closure-kidney health evaluation for diabetes:eGFR  and uACR.    [facility] Quality Team

## 2024-09-12 ENCOUNTER — Other Ambulatory Visit (HOSPITAL_COMMUNITY): Payer: Self-pay

## 2024-11-10 ENCOUNTER — Ambulatory Visit: Admitting: Family Medicine
